# Patient Record
Sex: Female | Born: 1970 | Race: White | Hispanic: No | Marital: Married | Smoking: Former smoker
Health system: Southern US, Community
[De-identification: ages and names within clinical notes are randomized; demographics above are authoritative.]

## PROBLEM LIST (undated history)

## (undated) DIAGNOSIS — I10 Essential (primary) hypertension: Secondary | ICD-10-CM

## (undated) DIAGNOSIS — R5383 Other fatigue: Secondary | ICD-10-CM

## (undated) DIAGNOSIS — R634 Abnormal weight loss: Secondary | ICD-10-CM

## (undated) DIAGNOSIS — J329 Chronic sinusitis, unspecified: Secondary | ICD-10-CM

## (undated) DIAGNOSIS — N189 Chronic kidney disease, unspecified: Secondary | ICD-10-CM

## (undated) DIAGNOSIS — K219 Gastro-esophageal reflux disease without esophagitis: Secondary | ICD-10-CM

## (undated) DIAGNOSIS — D649 Anemia, unspecified: Secondary | ICD-10-CM

## (undated) DIAGNOSIS — E8809 Other disorders of plasma-protein metabolism, not elsewhere classified: Secondary | ICD-10-CM

## (undated) DIAGNOSIS — F419 Anxiety disorder, unspecified: Secondary | ICD-10-CM

## (undated) DIAGNOSIS — C8330 Diffuse large B-cell lymphoma, unspecified site: Secondary | ICD-10-CM

## (undated) DIAGNOSIS — Z9221 Personal history of antineoplastic chemotherapy: Secondary | ICD-10-CM

## (undated) HISTORY — DX: Chronic kidney disease, unspecified: N18.9

## (undated) HISTORY — DX: Abnormal weight loss: R63.4

## (undated) HISTORY — PX: ABDOMINAL HYSTERECTOMY: SHX81

## (undated) HISTORY — PX: OTHER SURGICAL HISTORY: SHX169

## (undated) HISTORY — DX: Chronic sinusitis, unspecified: J32.9

## (undated) HISTORY — DX: Other disorders of plasma-protein metabolism, not elsewhere classified: E88.09

## (undated) HISTORY — PX: TUBAL LIGATION: SHX77

## (undated) HISTORY — DX: Other fatigue: R53.83

## (undated) HISTORY — PX: RECTAL PROLAPSE REPAIR: SHX759

## (undated) HISTORY — DX: Anemia, unspecified: D64.9

---

## 2005-05-11 ENCOUNTER — Ambulatory Visit: Payer: Self-pay | Admitting: Obstetrics and Gynecology

## 2005-05-26 ENCOUNTER — Ambulatory Visit: Payer: Self-pay | Admitting: Obstetrics and Gynecology

## 2006-05-29 ENCOUNTER — Ambulatory Visit: Payer: Self-pay | Admitting: Obstetrics and Gynecology

## 2007-09-25 ENCOUNTER — Ambulatory Visit: Payer: Self-pay | Admitting: Obstetrics and Gynecology

## 2011-07-25 DIAGNOSIS — C8330 Diffuse large B-cell lymphoma, unspecified site: Secondary | ICD-10-CM

## 2011-07-25 HISTORY — PX: PORTACATH PLACEMENT: SHX2246

## 2011-07-25 HISTORY — DX: Diffuse large B-cell lymphoma, unspecified site: C83.30

## 2011-11-29 ENCOUNTER — Ambulatory Visit: Payer: Self-pay | Admitting: Obstetrics and Gynecology

## 2011-11-29 LAB — URINALYSIS, COMPLETE
Bacteria: NONE SEEN
Bilirubin,UR: NEGATIVE
Blood: NEGATIVE
Glucose,UR: NEGATIVE mg/dL (ref 0–75)
Ketone: NEGATIVE
Leukocyte Esterase: NEGATIVE
Nitrite: NEGATIVE
Ph: 6 (ref 4.5–8.0)
Protein: NEGATIVE
RBC,UR: <1 /HPF (ref 0–5)
Specific Gravity: 1.017 (ref 1.003–1.030)
Squamous Epithelial: <1
WBC UR: <1 /HPF (ref 0–5)

## 2011-11-29 LAB — HEMOGLOBIN: HGB: 9.3 g/dL — ABNORMAL LOW (ref 12.0–16.0)

## 2011-12-04 ENCOUNTER — Ambulatory Visit: Payer: Self-pay | Admitting: Obstetrics and Gynecology

## 2011-12-04 LAB — PREGNANCY, URINE: Pregnancy Test, Urine: NEGATIVE m[IU]/mL

## 2011-12-05 LAB — HEMATOCRIT: HCT: 26.4 % — ABNORMAL LOW (ref 35.0–47.0)

## 2011-12-13 LAB — PATHOLOGY REPORT

## 2011-12-28 ENCOUNTER — Other Ambulatory Visit: Payer: Self-pay | Admitting: Family Medicine

## 2012-01-02 ENCOUNTER — Ambulatory Visit: Payer: Self-pay | Admitting: Internal Medicine

## 2012-01-03 ENCOUNTER — Inpatient Hospital Stay: Payer: Self-pay | Admitting: Internal Medicine

## 2012-01-03 LAB — IRON AND TIBC
Iron Bind.Cap.(Total): 229 ug/dL — ABNORMAL LOW (ref 250–450)
Iron Saturation: 37 %
Iron: 85 ug/dL (ref 50–170)
Unbound Iron-Bind.Cap.: 144 ug/dL

## 2012-01-03 LAB — URINALYSIS, COMPLETE
Bilirubin,UR: NEGATIVE
Blood: NEGATIVE
Glucose,UR: NEGATIVE mg/dL (ref 0–75)
Ketone: NEGATIVE
Nitrite: NEGATIVE
Ph: 6 (ref 4.5–8.0)
Protein: NEGATIVE
RBC,UR: 3 /HPF (ref 0–5)
Specific Gravity: 1.012 (ref 1.003–1.030)
Squamous Epithelial: 2
WBC UR: 53 /HPF (ref 0–5)

## 2012-01-03 LAB — COMPREHENSIVE METABOLIC PANEL
Albumin: 2.7 g/dL — ABNORMAL LOW (ref 3.4–5.0)
Alkaline Phosphatase: 140 U/L — ABNORMAL HIGH (ref 50–136)
Anion Gap: 11 (ref 7–16)
BUN: 28 mg/dL — ABNORMAL HIGH (ref 7–18)
Bilirubin,Total: 0.7 mg/dL (ref 0.2–1.0)
Calcium, Total: 13.6 mg/dL (ref 8.5–10.1)
Chloride: 97 mmol/L — ABNORMAL LOW (ref 98–107)
Co2: 26 mmol/L (ref 21–32)
Creatinine: 1.42 mg/dL — ABNORMAL HIGH (ref 0.60–1.30)
EGFR (African American): 52 — ABNORMAL LOW
EGFR (Non-African Amer.): 45 — ABNORMAL LOW
Glucose: 86 mg/dL (ref 65–99)
Osmolality: 273 (ref 275–301)
Potassium: 4.2 mmol/L (ref 3.5–5.1)
SGOT(AST): 54 U/L — ABNORMAL HIGH (ref 15–37)
SGPT (ALT): 27 U/L
Sodium: 134 mmol/L — ABNORMAL LOW (ref 136–145)
Total Protein: 5.2 g/dL — ABNORMAL LOW (ref 6.4–8.2)

## 2012-01-03 LAB — CBC
HCT: 29.3 % — ABNORMAL LOW (ref 35.0–47.0)
HGB: 9.5 g/dL — ABNORMAL LOW (ref 12.0–16.0)
MCH: 24.8 pg — ABNORMAL LOW (ref 26.0–34.0)
MCHC: 32.3 g/dL (ref 32.0–36.0)
MCV: 77 fL — ABNORMAL LOW (ref 80–100)
Platelet: 147 10*3/uL — ABNORMAL LOW (ref 150–440)
RBC: 3.81 10*6/uL (ref 3.80–5.20)
RDW: 22.3 % — ABNORMAL HIGH (ref 11.5–14.5)
WBC: 3.1 10*3/uL — ABNORMAL LOW (ref 3.6–11.0)

## 2012-01-03 LAB — CULTURE, BLOOD (SINGLE)

## 2012-01-03 LAB — PROTIME-INR
INR: 1.1
Prothrombin Time: 14.6 secs (ref 11.5–14.7)

## 2012-01-03 LAB — RETICULOCYTES
Absolute Retic Count: 0.0668 10*6/uL (ref 0.024–0.084)
Reticulocyte: 1.99 % — ABNORMAL HIGH (ref 0.5–1.5)

## 2012-01-03 LAB — FOLATE: Folic Acid: 11.5 ng/mL (ref 3.1–100.0)

## 2012-01-03 LAB — TSH: Thyroid Stimulating Horm: 2.77 u[IU]/mL

## 2012-01-03 LAB — LIPASE, BLOOD: Lipase: 143 U/L (ref 73–393)

## 2012-01-03 LAB — FERRITIN: Ferritin (ARMC): 1016 ng/mL — ABNORMAL HIGH (ref 8–388)

## 2012-01-04 ENCOUNTER — Ambulatory Visit: Payer: Self-pay | Admitting: Internal Medicine

## 2012-01-04 LAB — BASIC METABOLIC PANEL
Anion Gap: 9 (ref 7–16)
BUN: 24 mg/dL — ABNORMAL HIGH (ref 7–18)
Calcium, Total: 11.4 mg/dL — ABNORMAL HIGH (ref 8.5–10.1)
Chloride: 103 mmol/L (ref 98–107)
Co2: 25 mmol/L (ref 21–32)
Creatinine: 1.33 mg/dL — ABNORMAL HIGH (ref 0.60–1.30)
EGFR (African American): 57 — ABNORMAL LOW
EGFR (Non-African Amer.): 49 — ABNORMAL LOW
Glucose: 86 mg/dL (ref 65–99)
Osmolality: 277 (ref 275–301)
Potassium: 4.2 mmol/L (ref 3.5–5.1)
Sodium: 137 mmol/L (ref 136–145)

## 2012-01-04 LAB — CBC WITH DIFFERENTIAL/PLATELET
Bands: 9 %
Basophil #: 0 10*3/uL (ref 0.0–0.1)
Basophil %: 0.2 %
Comment - H1-Com3: NORMAL
Eosinophil #: 0 10*3/uL (ref 0.0–0.7)
Eosinophil %: 0.1 %
HCT: 22.8 % — ABNORMAL LOW (ref 35.0–47.0)
HGB: 7.5 g/dL — ABNORMAL LOW (ref 12.0–16.0)
Lymphocyte #: 0.1 10*3/uL — ABNORMAL LOW (ref 1.0–3.6)
Lymphocyte %: 6.7 %
Lymphocytes: 7 %
MCH: 25 pg — ABNORMAL LOW (ref 26.0–34.0)
MCHC: 32.8 g/dL (ref 32.0–36.0)
MCV: 76 fL — ABNORMAL LOW (ref 80–100)
Metamyelocyte: 1 %
Monocyte #: 0.2 x10 3/mm (ref 0.2–0.9)
Monocyte %: 9.8 %
Monocytes: 2 %
Myelocyte: 3 %
Neutrophil #: 1.7 10*3/uL (ref 1.4–6.5)
Neutrophil %: 83.2 %
Platelet: 136 10*3/uL — ABNORMAL LOW (ref 150–440)
RBC: 2.99 10*6/uL — ABNORMAL LOW (ref 3.80–5.20)
RDW: 22.4 % — ABNORMAL HIGH (ref 11.5–14.5)
Segmented Neutrophils: 79 %
WBC: 2 10*3/uL — CL (ref 3.6–11.0)

## 2012-01-04 LAB — OCCULT BLOOD X 1 CARD TO LAB, STOOL: Occult Blood, Feces: NEGATIVE

## 2012-01-05 LAB — CBC WITH DIFFERENTIAL/PLATELET
Bands: 4 %
Basophil #: 0 10*3/uL (ref 0.0–0.1)
Basophil %: 0.3 %
Eosinophil #: 0 10*3/uL (ref 0.0–0.7)
Eosinophil %: 0.3 %
HCT: 31.1 % — ABNORMAL LOW (ref 35.0–47.0)
HGB: 10.3 g/dL — ABNORMAL LOW (ref 12.0–16.0)
Lymphocyte #: 0.1 10*3/uL — ABNORMAL LOW (ref 1.0–3.6)
Lymphocyte %: 5.5 %
Lymphocytes: 8 %
MCH: 27 pg (ref 26.0–34.0)
MCHC: 33.2 g/dL (ref 32.0–36.0)
MCV: 81 fL (ref 80–100)
Monocyte #: 0.2 x10 3/mm (ref 0.2–0.9)
Monocyte %: 7.7 %
Monocytes: 6 %
Neutrophil #: 2 10*3/uL (ref 1.4–6.5)
Neutrophil %: 86.2 %
Platelet: 120 10*3/uL — ABNORMAL LOW (ref 150–440)
RBC: 3.83 10*6/uL (ref 3.80–5.20)
RDW: 22.4 % — ABNORMAL HIGH (ref 11.5–14.5)
Segmented Neutrophils: 78 %
Variant Lymphocyte - H1-Rlymph: 4 %
WBC: 2.3 10*3/uL — ABNORMAL LOW (ref 3.6–11.0)

## 2012-01-05 LAB — BASIC METABOLIC PANEL
Anion Gap: 9 (ref 7–16)
BUN: 22 mg/dL — ABNORMAL HIGH (ref 7–18)
Calcium, Total: 10.3 mg/dL — ABNORMAL HIGH (ref 8.5–10.1)
Chloride: 106 mmol/L (ref 98–107)
Co2: 24 mmol/L (ref 21–32)
Creatinine: 1.1 mg/dL (ref 0.60–1.30)
EGFR (African American): >60
EGFR (Non-African Amer.): >60
Glucose: 84 mg/dL (ref 65–99)
Osmolality: 280 (ref 275–301)
Potassium: 3.6 mmol/L (ref 3.5–5.1)
Sodium: 139 mmol/L (ref 136–145)

## 2012-01-05 LAB — CA 125: CA 125: 409.3 U/mL — ABNORMAL HIGH (ref 0.0–34.0)

## 2012-01-05 LAB — CANCER ANTIGEN 27.29: CA 27.29: 114 U/mL — ABNORMAL HIGH (ref 0.0–38.6)

## 2012-01-05 LAB — URINE CULTURE

## 2012-01-05 LAB — CANCER ANTIGEN 19-9: CA 19-9: 14 U/mL (ref 0–35)

## 2012-01-05 LAB — AFP TUMOR MARKER: AFP-Tumor Marker: 1.1 ng/mL (ref 0.0–8.3)

## 2012-01-05 LAB — CEA: CEA: 1.1 ng/mL (ref 0.0–4.7)

## 2012-01-06 LAB — CBC WITH DIFFERENTIAL/PLATELET
Basophil #: 0 10*3/uL (ref 0.0–0.1)
Basophil %: 0.2 %
Eosinophil #: 0 10*3/uL (ref 0.0–0.7)
Eosinophil %: 0.1 %
HCT: 27.1 % — ABNORMAL LOW (ref 35.0–47.0)
HGB: 9.1 g/dL — ABNORMAL LOW (ref 12.0–16.0)
Lymphocyte #: 0.1 10*3/uL — ABNORMAL LOW (ref 1.0–3.6)
Lymphocyte %: 4.6 %
MCH: 27.1 pg (ref 26.0–34.0)
MCHC: 33.7 g/dL (ref 32.0–36.0)
MCV: 81 fL (ref 80–100)
Monocyte #: 0.2 x10 3/mm (ref 0.2–0.9)
Monocyte %: 8.4 %
Neutrophil #: 1.9 10*3/uL (ref 1.4–6.5)
Neutrophil %: 86.7 %
Platelet: 112 10*3/uL — ABNORMAL LOW (ref 150–440)
RBC: 3.37 10*6/uL — ABNORMAL LOW (ref 3.80–5.20)
RDW: 22.8 % — ABNORMAL HIGH (ref 11.5–14.5)
WBC: 2.1 10*3/uL — ABNORMAL LOW (ref 3.6–11.0)

## 2012-01-08 LAB — CBC CANCER CENTER
Basophil #: 0 x10 3/mm (ref 0.0–0.1)
Basophil %: 0.1 %
Eosinophil #: 0 x10 3/mm (ref 0.0–0.7)
Eosinophil %: 0.2 %
HCT: 30 % — ABNORMAL LOW (ref 35.0–47.0)
HGB: 10 g/dL — ABNORMAL LOW (ref 12.0–16.0)
Lymphocyte #: 0.1 x10 3/mm — ABNORMAL LOW (ref 1.0–3.6)
Lymphocyte %: 4 %
MCH: 26.8 pg (ref 26.0–34.0)
MCHC: 33.3 g/dL (ref 32.0–36.0)
MCV: 81 fL (ref 80–100)
Monocyte #: 0.2 x10 3/mm (ref 0.2–0.9)
Monocyte %: 8.1 %
Neutrophil #: 2.2 x10 3/mm (ref 1.4–6.5)
Neutrophil %: 87.6 %
Platelet: 161 x10 3/mm (ref 150–440)
RBC: 3.72 10*6/uL — ABNORMAL LOW (ref 3.80–5.20)
RDW: 22.9 % — ABNORMAL HIGH (ref 11.5–14.5)
WBC: 2.5 x10 3/mm — ABNORMAL LOW (ref 3.6–11.0)

## 2012-01-09 LAB — BASIC METABOLIC PANEL
Anion Gap: 9 (ref 7–16)
BUN: 16 mg/dL (ref 7–18)
Calcium, Total: 8.4 mg/dL — ABNORMAL LOW (ref 8.5–10.1)
Chloride: 107 mmol/L (ref 98–107)
Co2: 27 mmol/L (ref 21–32)
Creatinine: 0.92 mg/dL (ref 0.60–1.30)
EGFR (African American): >60
EGFR (Non-African Amer.): >60
Glucose: 140 mg/dL — ABNORMAL HIGH (ref 65–99)
Osmolality: 288 (ref 275–301)
Potassium: 3.6 mmol/L (ref 3.5–5.1)
Sodium: 143 mmol/L (ref 136–145)

## 2012-01-11 ENCOUNTER — Ambulatory Visit: Payer: Self-pay | Admitting: Vascular Surgery

## 2012-01-12 LAB — CBC CANCER CENTER
Basophil #: 0 x10 3/mm (ref 0.0–0.1)
Basophil %: 0.3 %
Eosinophil #: 0 x10 3/mm (ref 0.0–0.7)
Eosinophil %: 0 %
HCT: 32.6 % — ABNORMAL LOW (ref 35.0–47.0)
HGB: 10.8 g/dL — ABNORMAL LOW (ref 12.0–16.0)
Lymphocyte #: 0.1 x10 3/mm — ABNORMAL LOW (ref 1.0–3.6)
Lymphocyte %: 3.8 %
MCH: 27.6 pg (ref 26.0–34.0)
MCHC: 33.2 g/dL (ref 32.0–36.0)
MCV: 83 fL (ref 80–100)
Monocyte #: 0.3 x10 3/mm (ref 0.2–0.9)
Monocyte %: 11.7 %
Neutrophil #: 1.8 x10 3/mm (ref 1.4–6.5)
Neutrophil %: 84.2 %
Platelet: 184 x10 3/mm (ref 150–440)
RBC: 3.93 10*6/uL (ref 3.80–5.20)
RDW: 23.8 % — ABNORMAL HIGH (ref 11.5–14.5)
WBC: 2.2 x10 3/mm — ABNORMAL LOW (ref 3.6–11.0)

## 2012-01-12 LAB — COMPREHENSIVE METABOLIC PANEL
Albumin: 2.3 g/dL — ABNORMAL LOW (ref 3.4–5.0)
Alkaline Phosphatase: 136 U/L (ref 50–136)
Anion Gap: 8 (ref 7–16)
BUN: 16 mg/dL (ref 7–18)
Bilirubin,Total: 0.7 mg/dL (ref 0.2–1.0)
Calcium, Total: 8.2 mg/dL — ABNORMAL LOW (ref 8.5–10.1)
Chloride: 104 mmol/L (ref 98–107)
Co2: 26 mmol/L (ref 21–32)
Creatinine: 1.01 mg/dL (ref 0.60–1.30)
EGFR (African American): >60
EGFR (Non-African Amer.): >60
Glucose: 135 mg/dL — ABNORMAL HIGH (ref 65–99)
Osmolality: 279 (ref 275–301)
Potassium: 4 mmol/L (ref 3.5–5.1)
SGOT(AST): 53 U/L — ABNORMAL HIGH (ref 15–37)
SGPT (ALT): 29 U/L
Sodium: 138 mmol/L (ref 136–145)
Total Protein: 4.6 g/dL — ABNORMAL LOW (ref 6.4–8.2)

## 2012-01-19 LAB — CBC CANCER CENTER
Basophil: 1 %
Eosinophil: 6 %
HCT: 26.3 % — ABNORMAL LOW (ref 35.0–47.0)
HGB: 8.5 g/dL — ABNORMAL LOW (ref 12.0–16.0)
Lymphocytes: 24 %
MCH: 26.8 pg (ref 26.0–34.0)
MCHC: 32.2 g/dL (ref 32.0–36.0)
MCV: 83 fL (ref 80–100)
Monocytes: 11 %
Other Cells Blood: 2 %
Platelet: 84 x10 3/mm — ABNORMAL LOW (ref 150–440)
RBC: 3.17 10*6/uL — ABNORMAL LOW (ref 3.80–5.20)
RDW: 22.5 % — ABNORMAL HIGH (ref 11.5–14.5)
Segmented Neutrophils: 4 %
Variant Lymphocyte: 2 %
WBC: 0.1 x10 3/mm — CL (ref 3.6–11.0)

## 2012-01-22 ENCOUNTER — Ambulatory Visit: Payer: Self-pay | Admitting: Internal Medicine

## 2012-01-24 LAB — CBC CANCER CENTER
Bands: 29 %
Basophil #: 0 x10 3/mm (ref 0.0–0.1)
Basophil %: 0.2 %
Eosinophil #: 0 x10 3/mm (ref 0.0–0.7)
Eosinophil %: 0.2 %
HCT: 23.8 % — ABNORMAL LOW (ref 35.0–47.0)
HGB: 7.7 g/dL — ABNORMAL LOW (ref 12.0–16.0)
Lymphocyte #: 0.3 x10 3/mm — ABNORMAL LOW (ref 1.0–3.6)
Lymphocyte %: 4.6 %
Lymphocytes: 3 %
MCH: 28 pg (ref 26.0–34.0)
MCHC: 32.4 g/dL (ref 32.0–36.0)
MCV: 86 fL (ref 80–100)
Metamyelocyte: 5 %
Monocyte #: 0.2 x10 3/mm (ref 0.2–0.9)
Monocyte %: 3.6 %
Monocytes: 5 %
Myelocyte: 7 %
NRBC/100 WBC: 2 /100
Neutrophil #: 5.6 x10 3/mm (ref 1.4–6.5)
Neutrophil %: 91.4 %
Other Cells Blood: 4 %
Platelet: 130 x10 3/mm — ABNORMAL LOW (ref 150–440)
Promyelocyte: 2 %
RBC: 2.76 10*6/uL — ABNORMAL LOW (ref 3.80–5.20)
RDW: 20.3 % — ABNORMAL HIGH (ref 11.5–14.5)
Segmented Neutrophils: 45 %
WBC: 6.2 x10 3/mm (ref 3.6–11.0)

## 2012-02-05 LAB — CBC CANCER CENTER
Basophil #: 0 x10 3/mm (ref 0.0–0.1)
Basophil %: 1.1 %
Eosinophil #: 0.1 x10 3/mm (ref 0.0–0.7)
Eosinophil %: 2.6 %
HCT: 25.3 % — ABNORMAL LOW (ref 35.0–47.0)
HGB: 8.1 g/dL — ABNORMAL LOW (ref 12.0–16.0)
Lymphocyte #: 0.2 x10 3/mm — ABNORMAL LOW (ref 1.0–3.6)
Lymphocyte %: 6.8 %
MCH: 29.4 pg (ref 26.0–34.0)
MCHC: 31.9 g/dL — ABNORMAL LOW (ref 32.0–36.0)
MCV: 92 fL (ref 80–100)
Monocyte #: 0.3 x10 3/mm (ref 0.2–0.9)
Monocyte %: 9.3 %
Neutrophil #: 2.3 x10 3/mm (ref 1.4–6.5)
Neutrophil %: 80.2 %
Platelet: 234 x10 3/mm (ref 150–440)
RBC: 2.75 10*6/uL — ABNORMAL LOW (ref 3.80–5.20)
RDW: 26 % — ABNORMAL HIGH (ref 11.5–14.5)
WBC: 2.9 x10 3/mm — ABNORMAL LOW (ref 3.6–11.0)

## 2012-02-05 LAB — COMPREHENSIVE METABOLIC PANEL
Albumin: 3.2 g/dL — ABNORMAL LOW (ref 3.4–5.0)
Alkaline Phosphatase: 183 U/L — ABNORMAL HIGH (ref 50–136)
Anion Gap: 10 (ref 7–16)
BUN: 11 mg/dL (ref 7–18)
Bilirubin,Total: 0.6 mg/dL (ref 0.2–1.0)
Calcium, Total: 8.6 mg/dL (ref 8.5–10.1)
Chloride: 106 mmol/L (ref 98–107)
Co2: 27 mmol/L (ref 21–32)
Creatinine: 0.83 mg/dL (ref 0.60–1.30)
EGFR (African American): >60
EGFR (Non-African Amer.): >60
Glucose: 157 mg/dL — ABNORMAL HIGH (ref 65–99)
Osmolality: 288 (ref 275–301)
Potassium: 3.9 mmol/L (ref 3.5–5.1)
SGOT(AST): 38 U/L — ABNORMAL HIGH (ref 15–37)
SGPT (ALT): 56 U/L
Sodium: 143 mmol/L (ref 136–145)
Total Protein: 5.8 g/dL — ABNORMAL LOW (ref 6.4–8.2)

## 2012-02-12 LAB — CBC CANCER CENTER
Basophil #: 0 x10 3/mm (ref 0.0–0.1)
Basophil %: 0.7 %
Eosinophil #: 0 x10 3/mm (ref 0.0–0.7)
Eosinophil %: 0.2 %
HCT: 24.5 % — ABNORMAL LOW (ref 35.0–47.0)
HGB: 7.8 g/dL — ABNORMAL LOW (ref 12.0–16.0)
Lymphocyte #: 0.1 x10 3/mm — ABNORMAL LOW (ref 1.0–3.6)
Lymphocyte %: 5 %
MCH: 29.5 pg (ref 26.0–34.0)
MCHC: 32.1 g/dL (ref 32.0–36.0)
MCV: 92 fL (ref 80–100)
Monocyte #: 0.1 x10 3/mm — ABNORMAL LOW (ref 0.2–0.9)
Monocyte %: 4.8 %
Neutrophil #: 1.9 x10 3/mm (ref 1.4–6.5)
Neutrophil %: 89.3 %
Platelet: 155 x10 3/mm (ref 150–440)
RBC: 2.66 10*6/uL — ABNORMAL LOW (ref 3.80–5.20)
RDW: 22.1 % — ABNORMAL HIGH (ref 11.5–14.5)
WBC: 2.2 x10 3/mm — ABNORMAL LOW (ref 3.6–11.0)

## 2012-02-19 ENCOUNTER — Inpatient Hospital Stay: Payer: Self-pay | Admitting: Oncology

## 2012-02-19 LAB — COMPREHENSIVE METABOLIC PANEL
Albumin: 3.7 g/dL (ref 3.4–5.0)
Alkaline Phosphatase: 123 U/L (ref 50–136)
Anion Gap: 9 (ref 7–16)
BUN: 12 mg/dL (ref 7–18)
Bilirubin,Total: 0.4 mg/dL (ref 0.2–1.0)
Calcium, Total: 9 mg/dL (ref 8.5–10.1)
Chloride: 106 mmol/L (ref 98–107)
Co2: 28 mmol/L (ref 21–32)
Creatinine: 0.8 mg/dL (ref 0.60–1.30)
EGFR (African American): >60
EGFR (Non-African Amer.): >60
Glucose: 99 mg/dL (ref 65–99)
Osmolality: 285 (ref 275–301)
Potassium: 4 mmol/L (ref 3.5–5.1)
SGOT(AST): 12 U/L — ABNORMAL LOW (ref 15–37)
SGPT (ALT): 31 U/L
Sodium: 143 mmol/L (ref 136–145)
Total Protein: 6.3 g/dL — ABNORMAL LOW (ref 6.4–8.2)

## 2012-02-19 LAB — CBC CANCER CENTER
Basophil #: 0 x10 3/mm (ref 0.0–0.1)
Basophil %: 0.5 %
Eosinophil #: 0 x10 3/mm (ref 0.0–0.7)
Eosinophil %: 0.4 %
HCT: 28.1 % — ABNORMAL LOW (ref 35.0–47.0)
HGB: 9.3 g/dL — ABNORMAL LOW (ref 12.0–16.0)
Lymphocyte #: 0.2 x10 3/mm — ABNORMAL LOW (ref 1.0–3.6)
Lymphocyte %: 3.9 %
MCH: 31 pg (ref 26.0–34.0)
MCHC: 32.9 g/dL (ref 32.0–36.0)
MCV: 94 fL (ref 80–100)
Monocyte #: 0.5 x10 3/mm (ref 0.2–0.9)
Monocyte %: 9.7 %
Neutrophil #: 4.2 x10 3/mm (ref 1.4–6.5)
Neutrophil %: 85.5 %
Platelet: 203 x10 3/mm (ref 150–440)
RBC: 2.98 10*6/uL — ABNORMAL LOW (ref 3.80–5.20)
RDW: 22.7 % — ABNORMAL HIGH (ref 11.5–14.5)
WBC: 5 x10 3/mm (ref 3.6–11.0)

## 2012-02-19 LAB — URINE PH
Ph: 6 (ref 4.5–8.0)
Ph: 9 (ref 4.5–8.0)

## 2012-02-20 LAB — URINE PH
Ph: 8 (ref 4.5–8.0)
Ph: 8 (ref 4.5–8.0)
Ph: 9 (ref 4.5–8.0)
Ph: 8 (ref 4.5–8.0)

## 2012-02-20 LAB — BASIC METABOLIC PANEL
Anion Gap: 9 (ref 7–16)
BUN: 7 mg/dL (ref 7–18)
Calcium, Total: 8.3 mg/dL — ABNORMAL LOW (ref 8.5–10.1)
Chloride: 107 mmol/L (ref 98–107)
Co2: 29 mmol/L (ref 21–32)
Creatinine: 0.74 mg/dL (ref 0.60–1.30)
EGFR (African American): >60
EGFR (Non-African Amer.): >60
Glucose: 99 mg/dL (ref 65–99)
Osmolality: 287 (ref 275–301)
Potassium: 3.8 mmol/L (ref 3.5–5.1)
Sodium: 145 mmol/L (ref 136–145)

## 2012-02-21 LAB — URINE PH
Ph: 8 (ref 4.5–8.0)
Ph: 7 (ref 4.5–8.0)
Ph: 6 (ref 4.5–8.0)
Ph: 7 (ref 4.5–8.0)

## 2012-02-21 LAB — BASIC METABOLIC PANEL
Anion Gap: 6 — ABNORMAL LOW (ref 7–16)
BUN: 11 mg/dL (ref 7–18)
Calcium, Total: 8.6 mg/dL (ref 8.5–10.1)
Chloride: 107 mmol/L (ref 98–107)
Co2: 31 mmol/L (ref 21–32)
Creatinine: 0.6 mg/dL (ref 0.60–1.30)
EGFR (African American): >60
EGFR (Non-African Amer.): >60
Glucose: 114 mg/dL — ABNORMAL HIGH (ref 65–99)
Osmolality: 287 (ref 275–301)
Potassium: 3.6 mmol/L (ref 3.5–5.1)
Sodium: 144 mmol/L (ref 136–145)

## 2012-02-22 ENCOUNTER — Ambulatory Visit: Payer: Self-pay | Admitting: Internal Medicine

## 2012-02-22 LAB — URINE PH
Ph: 9 (ref 4.5–8.0)
Ph: 9 (ref 4.5–8.0)
Ph: 9 (ref 4.5–8.0)

## 2012-02-22 LAB — BASIC METABOLIC PANEL
Anion Gap: 6 — ABNORMAL LOW (ref 7–16)
BUN: 8 mg/dL (ref 7–18)
Calcium, Total: 7.8 mg/dL — ABNORMAL LOW (ref 8.5–10.1)
Chloride: 112 mmol/L — ABNORMAL HIGH (ref 98–107)
Co2: 31 mmol/L (ref 21–32)
Creatinine: 0.77 mg/dL (ref 0.60–1.30)
EGFR (African American): >60
EGFR (Non-African Amer.): >60
Glucose: 86 mg/dL (ref 65–99)
Osmolality: 294 (ref 275–301)
Potassium: 3.9 mmol/L (ref 3.5–5.1)
Sodium: 149 mmol/L — ABNORMAL HIGH (ref 136–145)

## 2012-02-23 LAB — URINE PH
Ph: 8 (ref 4.5–8.0)
Ph: 8 (ref 4.5–8.0)

## 2012-02-23 LAB — BASIC METABOLIC PANEL
Anion Gap: 7 (ref 7–16)
BUN: 9 mg/dL (ref 7–18)
Calcium, Total: 8.7 mg/dL (ref 8.5–10.1)
Chloride: 107 mmol/L (ref 98–107)
Co2: 30 mmol/L (ref 21–32)
Creatinine: 0.64 mg/dL (ref 0.60–1.30)
EGFR (African American): >60
EGFR (Non-African Amer.): >60
Glucose: 105 mg/dL — ABNORMAL HIGH (ref 65–99)
Osmolality: 286 (ref 275–301)
Potassium: 4.3 mmol/L (ref 3.5–5.1)
Sodium: 144 mmol/L (ref 136–145)

## 2012-02-26 LAB — COMPREHENSIVE METABOLIC PANEL
Albumin: 3.7 g/dL (ref 3.4–5.0)
Alkaline Phosphatase: 108 U/L (ref 50–136)
Anion Gap: 10 (ref 7–16)
BUN: 18 mg/dL (ref 7–18)
Bilirubin,Total: 0.4 mg/dL (ref 0.2–1.0)
Calcium, Total: 9.1 mg/dL (ref 8.5–10.1)
Chloride: 105 mmol/L (ref 98–107)
Co2: 27 mmol/L (ref 21–32)
Creatinine: 0.8 mg/dL (ref 0.60–1.30)
EGFR (African American): >60
EGFR (Non-African Amer.): >60
Glucose: 117 mg/dL — ABNORMAL HIGH (ref 65–99)
Osmolality: 286 (ref 275–301)
Potassium: 4 mmol/L (ref 3.5–5.1)
SGOT(AST): 32 U/L (ref 15–37)
SGPT (ALT): 65 U/L (ref 12–78)
Sodium: 142 mmol/L (ref 136–145)
Total Protein: 6.3 g/dL — ABNORMAL LOW (ref 6.4–8.2)

## 2012-02-26 LAB — CBC CANCER CENTER
Basophil #: 0 x10 3/mm (ref 0.0–0.1)
Basophil %: 0.7 %
Eosinophil #: 0 x10 3/mm (ref 0.0–0.7)
Eosinophil %: 0.2 %
HCT: 26.1 % — ABNORMAL LOW (ref 35.0–47.0)
HGB: 9 g/dL — ABNORMAL LOW (ref 12.0–16.0)
Lymphocyte #: 0.1 x10 3/mm — ABNORMAL LOW (ref 1.0–3.6)
Lymphocyte %: 4.7 %
MCH: 32.2 pg (ref 26.0–34.0)
MCHC: 34.4 g/dL (ref 32.0–36.0)
MCV: 94 fL (ref 80–100)
Monocyte #: 0.1 x10 3/mm — ABNORMAL LOW (ref 0.2–0.9)
Monocyte %: 4.8 %
Neutrophil #: 2.6 x10 3/mm (ref 1.4–6.5)
Neutrophil %: 89.6 %
Platelet: 262 x10 3/mm (ref 150–440)
RBC: 2.79 10*6/uL — ABNORMAL LOW (ref 3.80–5.20)
RDW: 20.6 % — ABNORMAL HIGH (ref 11.5–14.5)
WBC: 2.9 x10 3/mm — ABNORMAL LOW (ref 3.6–11.0)

## 2012-03-04 LAB — CBC CANCER CENTER
Basophil #: 0 x10 3/mm (ref 0.0–0.1)
Basophil %: 0.8 %
Eosinophil #: 0 x10 3/mm (ref 0.0–0.7)
Eosinophil %: 0.5 %
HCT: 18.7 % — ABNORMAL LOW (ref 35.0–47.0)
HGB: 6.2 g/dL — ABNORMAL LOW (ref 12.0–16.0)
Lymphocyte #: 0.1 x10 3/mm — ABNORMAL LOW (ref 1.0–3.6)
Lymphocyte %: 8.5 %
MCH: 31.8 pg (ref 26.0–34.0)
MCHC: 33.3 g/dL (ref 32.0–36.0)
MCV: 96 fL (ref 80–100)
Monocyte #: 0.1 x10 3/mm — ABNORMAL LOW (ref 0.2–0.9)
Monocyte %: 7.5 %
Neutrophil #: 0.8 x10 3/mm — ABNORMAL LOW (ref 1.4–6.5)
Neutrophil %: 82.7 %
Platelet: 65 x10 3/mm — ABNORMAL LOW (ref 150–440)
RBC: 1.96 10*6/uL — ABNORMAL LOW (ref 3.80–5.20)
RDW: 19.5 % — ABNORMAL HIGH (ref 11.5–14.5)
WBC: 1 x10 3/mm — CL (ref 3.6–11.0)

## 2012-03-06 LAB — CBC CANCER CENTER
Basophil #: 0 x10 3/mm (ref 0.0–0.1)
Basophil %: 0.9 %
Eosinophil #: 0 x10 3/mm (ref 0.0–0.7)
Eosinophil %: 0.6 %
HCT: 28 % — ABNORMAL LOW (ref 35.0–47.0)
HGB: 9.2 g/dL — ABNORMAL LOW (ref 12.0–16.0)
Lymphocyte #: 0.2 x10 3/mm — ABNORMAL LOW (ref 1.0–3.6)
Lymphocyte %: 7 %
MCH: 31.6 pg (ref 26.0–34.0)
MCHC: 32.8 g/dL (ref 32.0–36.0)
MCV: 96 fL (ref 80–100)
Monocyte #: 0.4 x10 3/mm (ref 0.2–0.9)
Monocyte %: 13 %
Neutrophil #: 2.2 x10 3/mm (ref 1.4–6.5)
Neutrophil %: 78.5 %
Platelet: 134 x10 3/mm — ABNORMAL LOW (ref 150–440)
RBC: 2.91 10*6/uL — ABNORMAL LOW (ref 3.80–5.20)
RDW: 19.6 % — ABNORMAL HIGH (ref 11.5–14.5)
WBC: 2.8 x10 3/mm — ABNORMAL LOW (ref 3.6–11.0)

## 2012-03-11 LAB — CBC CANCER CENTER
Basophil #: 0 x10 3/mm (ref 0.0–0.1)
Basophil %: 0.5 %
Eosinophil #: 0 x10 3/mm (ref 0.0–0.7)
Eosinophil %: 0.1 %
HCT: 30.5 % — ABNORMAL LOW (ref 35.0–47.0)
HGB: 9.9 g/dL — ABNORMAL LOW (ref 12.0–16.0)
Lymphocyte #: 0.2 x10 3/mm — ABNORMAL LOW (ref 1.0–3.6)
Lymphocyte %: 3.3 %
MCH: 31.3 pg (ref 26.0–34.0)
MCHC: 32.4 g/dL (ref 32.0–36.0)
MCV: 97 fL (ref 80–100)
Monocyte #: 0.4 x10 3/mm (ref 0.2–0.9)
Monocyte %: 6.9 %
Neutrophil #: 5.7 x10 3/mm (ref 1.4–6.5)
Neutrophil %: 89.2 %
Platelet: 272 x10 3/mm (ref 150–440)
RBC: 3.15 10*6/uL — ABNORMAL LOW (ref 3.80–5.20)
RDW: 19.4 % — ABNORMAL HIGH (ref 11.5–14.5)
WBC: 6.4 x10 3/mm (ref 3.6–11.0)

## 2012-03-18 ENCOUNTER — Ambulatory Visit: Payer: Self-pay | Admitting: Oncology

## 2012-03-18 LAB — CBC CANCER CENTER
Basophil #: 0 x10 3/mm (ref 0.0–0.1)
Basophil %: 0.6 %
Eosinophil #: 0 x10 3/mm (ref 0.0–0.7)
Eosinophil %: 0.2 %
HCT: 29.7 % — ABNORMAL LOW (ref 35.0–47.0)
HGB: 10 g/dL — ABNORMAL LOW (ref 12.0–16.0)
Lymphocyte #: 0.2 x10 3/mm — ABNORMAL LOW (ref 1.0–3.6)
Lymphocyte %: 4.7 %
MCH: 32 pg (ref 26.0–34.0)
MCHC: 33.6 g/dL (ref 32.0–36.0)
MCV: 95 fL (ref 80–100)
Monocyte #: 0.5 x10 3/mm (ref 0.2–0.9)
Monocyte %: 11.9 %
Neutrophil #: 3.5 x10 3/mm (ref 1.4–6.5)
Neutrophil %: 82.6 %
Platelet: 247 x10 3/mm (ref 150–440)
RBC: 3.12 10*6/uL — ABNORMAL LOW (ref 3.80–5.20)
RDW: 18.1 % — ABNORMAL HIGH (ref 11.5–14.5)
WBC: 4.2 x10 3/mm (ref 3.6–11.0)

## 2012-03-18 LAB — COMPREHENSIVE METABOLIC PANEL
Albumin: 3.7 g/dL (ref 3.4–5.0)
Alkaline Phosphatase: 81 U/L (ref 50–136)
Anion Gap: 7 (ref 7–16)
BUN: 19 mg/dL — ABNORMAL HIGH (ref 7–18)
Bilirubin,Total: 0.5 mg/dL (ref 0.2–1.0)
Calcium, Total: 8.6 mg/dL (ref 8.5–10.1)
Chloride: 105 mmol/L (ref 98–107)
Co2: 29 mmol/L (ref 21–32)
Creatinine: 0.72 mg/dL (ref 0.60–1.30)
EGFR (African American): >60
EGFR (Non-African Amer.): >60
Glucose: 87 mg/dL (ref 65–99)
Osmolality: 283 (ref 275–301)
Potassium: 3.9 mmol/L (ref 3.5–5.1)
SGOT(AST): 19 U/L (ref 15–37)
SGPT (ALT): 28 U/L (ref 12–78)
Sodium: 141 mmol/L (ref 136–145)
Total Protein: 6.2 g/dL — ABNORMAL LOW (ref 6.4–8.2)

## 2012-03-24 ENCOUNTER — Ambulatory Visit: Payer: Self-pay | Admitting: Internal Medicine

## 2012-03-26 LAB — CBC CANCER CENTER
Basophil #: 0 x10 3/mm (ref 0.0–0.1)
Basophil %: 0.9 %
Eosinophil #: 0 x10 3/mm (ref 0.0–0.7)
Eosinophil %: 0.4 %
HCT: 31.6 % — ABNORMAL LOW (ref 35.0–47.0)
HGB: 10.4 g/dL — ABNORMAL LOW (ref 12.0–16.0)
Lymphocyte #: 0.2 x10 3/mm — ABNORMAL LOW (ref 1.0–3.6)
Lymphocyte %: 4 %
MCH: 31.2 pg (ref 26.0–34.0)
MCHC: 32.8 g/dL (ref 32.0–36.0)
MCV: 95 fL (ref 80–100)
Monocyte #: 0.3 x10 3/mm (ref 0.2–0.9)
Monocyte %: 7.4 %
Neutrophil #: 3.5 x10 3/mm (ref 1.4–6.5)
Neutrophil %: 87.3 %
Platelet: 110 x10 3/mm — ABNORMAL LOW (ref 150–440)
RBC: 3.32 10*6/uL — ABNORMAL LOW (ref 3.80–5.20)
RDW: 16.8 % — ABNORMAL HIGH (ref 11.5–14.5)
WBC: 4 x10 3/mm (ref 3.6–11.0)

## 2012-04-01 ENCOUNTER — Inpatient Hospital Stay: Payer: Self-pay | Admitting: Oncology

## 2012-04-01 LAB — CBC CANCER CENTER
Basophil #: 0 x10 3/mm (ref 0.0–0.1)
Basophil %: 0.5 %
Eosinophil #: 0 x10 3/mm (ref 0.0–0.7)
Eosinophil %: 0.2 %
HCT: 34 % — ABNORMAL LOW (ref 35.0–47.0)
HGB: 11.1 g/dL — ABNORMAL LOW (ref 12.0–16.0)
Lymphocyte #: 0.2 x10 3/mm — ABNORMAL LOW (ref 1.0–3.6)
Lymphocyte %: 3 %
MCH: 31.1 pg (ref 26.0–34.0)
MCHC: 32.8 g/dL (ref 32.0–36.0)
MCV: 95 fL (ref 80–100)
Monocyte #: 0.7 x10 3/mm (ref 0.2–0.9)
Monocyte %: 9 %
Neutrophil #: 7 x10 3/mm — ABNORMAL HIGH (ref 1.4–6.5)
Neutrophil %: 87.3 %
Platelet: 192 x10 3/mm (ref 150–440)
RBC: 3.58 10*6/uL — ABNORMAL LOW (ref 3.80–5.20)
RDW: 16.5 % — ABNORMAL HIGH (ref 11.5–14.5)
WBC: 8 x10 3/mm (ref 3.6–11.0)

## 2012-04-01 LAB — COMPREHENSIVE METABOLIC PANEL
Albumin: 3.8 g/dL (ref 3.4–5.0)
Alkaline Phosphatase: 97 U/L (ref 50–136)
Anion Gap: 7 (ref 7–16)
BUN: 15 mg/dL (ref 7–18)
Bilirubin,Total: 0.3 mg/dL (ref 0.2–1.0)
Calcium, Total: 8.9 mg/dL (ref 8.5–10.1)
Chloride: 105 mmol/L (ref 98–107)
Co2: 29 mmol/L (ref 21–32)
Creatinine: 0.89 mg/dL (ref 0.60–1.30)
EGFR (African American): >60
EGFR (Non-African Amer.): >60
Glucose: 93 mg/dL (ref 65–99)
Osmolality: 282 (ref 275–301)
Potassium: 4.1 mmol/L (ref 3.5–5.1)
SGOT(AST): 16 U/L (ref 15–37)
SGPT (ALT): 25 U/L (ref 12–78)
Sodium: 141 mmol/L (ref 136–145)
Total Protein: 6.5 g/dL (ref 6.4–8.2)

## 2012-04-01 LAB — URINE PH
Ph: 5 (ref 4.5–8.0)
Ph: 9 (ref 4.5–8.0)
Ph: 9 (ref 4.5–8.0)

## 2012-04-02 LAB — URINE PH
Ph: 8 (ref 4.5–8.0)
Ph: 7 (ref 4.5–8.0)
Ph: 7 (ref 4.5–8.0)
Ph: 9 (ref 4.5–8.0)

## 2012-04-03 LAB — COMPREHENSIVE METABOLIC PANEL
Albumin: 3.1 g/dL — ABNORMAL LOW (ref 3.4–5.0)
Alkaline Phosphatase: 73 U/L (ref 50–136)
Anion Gap: 8 (ref 7–16)
BUN: 10 mg/dL (ref 7–18)
Bilirubin,Total: 0.3 mg/dL (ref 0.2–1.0)
Calcium, Total: 8.4 mg/dL — ABNORMAL LOW (ref 8.5–10.1)
Chloride: 111 mmol/L — ABNORMAL HIGH (ref 98–107)
Co2: 28 mmol/L (ref 21–32)
Creatinine: 0.66 mg/dL (ref 0.60–1.30)
EGFR (African American): >60
EGFR (Non-African Amer.): >60
Glucose: 97 mg/dL (ref 65–99)
Osmolality: 291 (ref 275–301)
Potassium: 3.5 mmol/L (ref 3.5–5.1)
SGOT(AST): 20 U/L (ref 15–37)
SGPT (ALT): 21 U/L (ref 12–78)
Sodium: 147 mmol/L — ABNORMAL HIGH (ref 136–145)
Total Protein: 5.5 g/dL — ABNORMAL LOW (ref 6.4–8.2)

## 2012-04-03 LAB — URINE PH
Ph: 7 (ref 4.5–8.0)
Ph: 9 (ref 4.5–8.0)
Ph: 9 (ref 4.5–8.0)
Ph: 9 (ref 4.5–8.0)
Ph: 9 (ref 4.5–8.0)

## 2012-04-04 LAB — BASIC METABOLIC PANEL
Anion Gap: 6 — ABNORMAL LOW (ref 7–16)
BUN: 9 mg/dL (ref 7–18)
Calcium, Total: 8.2 mg/dL — ABNORMAL LOW (ref 8.5–10.1)
Chloride: 112 mmol/L — ABNORMAL HIGH (ref 98–107)
Co2: 30 mmol/L (ref 21–32)
Creatinine: 0.62 mg/dL (ref 0.60–1.30)
EGFR (African American): >60
EGFR (Non-African Amer.): >60
Glucose: 84 mg/dL (ref 65–99)
Osmolality: 292 (ref 275–301)
Potassium: 3.8 mmol/L (ref 3.5–5.1)
Sodium: 148 mmol/L — ABNORMAL HIGH (ref 136–145)

## 2012-04-04 LAB — URINE PH
Ph: 7
Ph: 7
Ph: 9 (ref 4.5–8.0)
Ph: 9 (ref 4.5–8.0)
Ph: 8 (ref 4.5–8.0)

## 2012-04-05 LAB — BASIC METABOLIC PANEL
Anion Gap: 6 — ABNORMAL LOW (ref 7–16)
BUN: 8 mg/dL (ref 7–18)
Calcium, Total: 8.7 mg/dL (ref 8.5–10.1)
Chloride: 107 mmol/L (ref 98–107)
Co2: 29 mmol/L (ref 21–32)
Creatinine: 0.67 mg/dL (ref 0.60–1.30)
EGFR (African American): >60
EGFR (Non-African Amer.): >60
Glucose: 91 mg/dL (ref 65–99)
Osmolality: 281 (ref 275–301)
Potassium: 4.1 mmol/L (ref 3.5–5.1)
Sodium: 142 mmol/L (ref 136–145)

## 2012-04-05 LAB — URINE PH: Ph: 9 (ref 4.5–8.0)

## 2012-04-08 LAB — CBC CANCER CENTER
Basophil #: 0 x10 3/mm (ref 0.0–0.1)
Basophil %: 0.5 %
Eosinophil #: 0 x10 3/mm (ref 0.0–0.7)
Eosinophil %: 0.1 %
HCT: 34.5 % — ABNORMAL LOW (ref 35.0–47.0)
HGB: 11.4 g/dL — ABNORMAL LOW (ref 12.0–16.0)
Lymphocyte #: 0.2 x10 3/mm — ABNORMAL LOW (ref 1.0–3.6)
Lymphocyte %: 5.2 %
MCH: 30.6 pg (ref 26.0–34.0)
MCHC: 32.9 g/dL (ref 32.0–36.0)
MCV: 93 fL (ref 80–100)
Monocyte #: 0.3 x10 3/mm (ref 0.2–0.9)
Monocyte %: 6.4 %
Neutrophil #: 3.7 x10 3/mm (ref 1.4–6.5)
Neutrophil %: 87.8 %
Platelet: 221 x10 3/mm (ref 150–440)
RBC: 3.71 10*6/uL — ABNORMAL LOW (ref 3.80–5.20)
RDW: 15.6 % — ABNORMAL HIGH (ref 11.5–14.5)
WBC: 4.2 x10 3/mm (ref 3.6–11.0)

## 2012-04-08 LAB — COMPREHENSIVE METABOLIC PANEL
Albumin: 3.8 g/dL (ref 3.4–5.0)
Alkaline Phosphatase: 85 U/L (ref 50–136)
Anion Gap: 8 (ref 7–16)
BUN: 17 mg/dL (ref 7–18)
Bilirubin,Total: 0.3 mg/dL (ref 0.2–1.0)
Calcium, Total: 9.1 mg/dL (ref 8.5–10.1)
Chloride: 105 mmol/L (ref 98–107)
Co2: 29 mmol/L (ref 21–32)
Creatinine: 0.78 mg/dL (ref 0.60–1.30)
EGFR (African American): >60
EGFR (Non-African Amer.): >60
Glucose: 102 mg/dL — ABNORMAL HIGH (ref 65–99)
Osmolality: 285 (ref 275–301)
Potassium: 3.9 mmol/L (ref 3.5–5.1)
SGOT(AST): 19 U/L (ref 15–37)
SGPT (ALT): 34 U/L (ref 12–78)
Sodium: 142 mmol/L (ref 136–145)
Total Protein: 6.6 g/dL (ref 6.4–8.2)

## 2012-04-15 LAB — CBC CANCER CENTER
Basophil #: 0 x10 3/mm (ref 0.0–0.1)
Basophil %: 0.8 %
Eosinophil #: 0 x10 3/mm (ref 0.0–0.7)
Eosinophil %: 0.5 %
HCT: 31.3 % — ABNORMAL LOW (ref 35.0–47.0)
HGB: 10.3 g/dL — ABNORMAL LOW (ref 12.0–16.0)
Lymphocyte #: 0.2 x10 3/mm — ABNORMAL LOW (ref 1.0–3.6)
Lymphocyte %: 5.7 %
MCH: 30.5 pg (ref 26.0–34.0)
MCHC: 32.9 g/dL (ref 32.0–36.0)
MCV: 93 fL (ref 80–100)
Monocyte #: 0.3 x10 3/mm (ref 0.2–0.9)
Monocyte %: 8.5 %
Neutrophil #: 2.8 x10 3/mm (ref 1.4–6.5)
Neutrophil %: 84.5 %
Platelet: 91 x10 3/mm — ABNORMAL LOW (ref 150–440)
RBC: 3.37 10*6/uL — ABNORMAL LOW (ref 3.80–5.20)
RDW: 15.5 % — ABNORMAL HIGH (ref 11.5–14.5)
WBC: 3.4 x10 3/mm — ABNORMAL LOW (ref 3.6–11.0)

## 2012-04-22 LAB — CBC CANCER CENTER
Basophil #: 0 x10 3/mm (ref 0.0–0.1)
Basophil %: 0.4 %
Eosinophil #: 0 x10 3/mm (ref 0.0–0.7)
Eosinophil %: 0.3 %
HCT: 34.2 % — ABNORMAL LOW (ref 35.0–47.0)
HGB: 11.3 g/dL — ABNORMAL LOW (ref 12.0–16.0)
Lymphocyte #: 0.2 x10 3/mm — ABNORMAL LOW (ref 1.0–3.6)
Lymphocyte %: 4.1 %
MCH: 30.5 pg (ref 26.0–34.0)
MCHC: 32.9 g/dL (ref 32.0–36.0)
MCV: 93 fL (ref 80–100)
Monocyte #: 0.6 x10 3/mm (ref 0.2–0.9)
Monocyte %: 14.4 %
Neutrophil #: 3.5 x10 3/mm (ref 1.4–6.5)
Neutrophil %: 80.8 %
Platelet: 242 x10 3/mm (ref 150–440)
RBC: 3.69 10*6/uL — ABNORMAL LOW (ref 3.80–5.20)
RDW: 16.1 % — ABNORMAL HIGH (ref 11.5–14.5)
WBC: 4.4 x10 3/mm (ref 3.6–11.0)

## 2012-04-23 ENCOUNTER — Ambulatory Visit: Payer: Self-pay | Admitting: Internal Medicine

## 2012-04-23 LAB — URINALYSIS, COMPLETE
Bilirubin,UR: NEGATIVE
Blood: NEGATIVE
Glucose,UR: NEGATIVE mg/dL (ref 0–75)
Ketone: NEGATIVE
Leukocyte Esterase: NEGATIVE
Nitrite: NEGATIVE
Ph: 5 (ref 4.5–8.0)
Protein: NEGATIVE
RBC,UR: NONE SEEN /HPF (ref 0–5)
Specific Gravity: 1.016 (ref 1.003–1.030)
Squamous Epithelial: 1
WBC UR: <1 /HPF (ref 0–5)

## 2012-04-23 LAB — BASIC METABOLIC PANEL
Anion Gap: 12 (ref 7–16)
BUN: 10 mg/dL (ref 7–18)
Calcium, Total: 8.9 mg/dL (ref 8.5–10.1)
Chloride: 103 mmol/L (ref 98–107)
Co2: 27 mmol/L (ref 21–32)
Creatinine: 0.81 mg/dL (ref 0.60–1.30)
EGFR (African American): >60
EGFR (Non-African Amer.): >60
Glucose: 132 mg/dL — ABNORMAL HIGH (ref 65–99)
Osmolality: 284 (ref 275–301)
Potassium: 3.5 mmol/L (ref 3.5–5.1)
Sodium: 142 mmol/L (ref 136–145)

## 2012-04-24 LAB — URINE CULTURE

## 2012-04-29 LAB — CBC CANCER CENTER
Basophil #: 0 x10 3/mm (ref 0.0–0.1)
Basophil %: 1.4 %
Eosinophil #: 0 x10 3/mm (ref 0.0–0.7)
Eosinophil %: 0.5 %
HCT: 33.8 % — ABNORMAL LOW (ref 35.0–47.0)
HGB: 11.2 g/dL — ABNORMAL LOW (ref 12.0–16.0)
Lymphocyte #: 0.3 x10 3/mm — ABNORMAL LOW (ref 1.0–3.6)
Lymphocyte %: 8.8 %
MCH: 30.3 pg (ref 26.0–34.0)
MCHC: 33.2 g/dL (ref 32.0–36.0)
MCV: 91 fL (ref 80–100)
Monocyte #: 0.6 x10 3/mm (ref 0.2–0.9)
Monocyte %: 18.5 %
Neutrophil #: 2.2 x10 3/mm (ref 1.4–6.5)
Neutrophil %: 70.8 %
Platelet: 225 x10 3/mm (ref 150–440)
RBC: 3.71 10*6/uL — ABNORMAL LOW (ref 3.80–5.20)
RDW: 15.6 % — ABNORMAL HIGH (ref 11.5–14.5)
WBC: 3.1 x10 3/mm — ABNORMAL LOW (ref 3.6–11.0)

## 2012-04-29 LAB — COMPREHENSIVE METABOLIC PANEL
Albumin: 3.5 g/dL (ref 3.4–5.0)
Alkaline Phosphatase: 67 U/L (ref 50–136)
Anion Gap: 9 (ref 7–16)
BUN: 8 mg/dL (ref 7–18)
Bilirubin,Total: 0.3 mg/dL (ref 0.2–1.0)
Calcium, Total: 8.9 mg/dL (ref 8.5–10.1)
Chloride: 105 mmol/L (ref 98–107)
Co2: 27 mmol/L (ref 21–32)
Creatinine: 0.78 mg/dL (ref 0.60–1.30)
EGFR (African American): >60
EGFR (Non-African Amer.): >60
Glucose: 109 mg/dL — ABNORMAL HIGH (ref 65–99)
Osmolality: 280 (ref 275–301)
Potassium: 4.2 mmol/L (ref 3.5–5.1)
SGOT(AST): 21 U/L (ref 15–37)
SGPT (ALT): 25 U/L (ref 12–78)
Sodium: 141 mmol/L (ref 136–145)
Total Protein: 6.1 g/dL — ABNORMAL LOW (ref 6.4–8.2)

## 2012-05-06 LAB — CBC CANCER CENTER
Basophil #: 0 x10 3/mm (ref 0.0–0.1)
Basophil %: 1 %
Eosinophil #: 0 x10 3/mm (ref 0.0–0.7)
Eosinophil %: 0.6 %
HCT: 32 % — ABNORMAL LOW (ref 35.0–47.0)
HGB: 10.6 g/dL — ABNORMAL LOW (ref 12.0–16.0)
Lymphocyte #: 0.1 x10 3/mm — ABNORMAL LOW (ref 1.0–3.6)
Lymphocyte %: 3.6 %
MCH: 30.5 pg (ref 26.0–34.0)
MCHC: 33.2 g/dL (ref 32.0–36.0)
MCV: 92 fL (ref 80–100)
Monocyte #: 0.2 x10 3/mm (ref 0.2–0.9)
Monocyte %: 5 %
Neutrophil #: 3.7 x10 3/mm (ref 1.4–6.5)
Neutrophil %: 89.8 %
Platelet: 120 x10 3/mm — ABNORMAL LOW (ref 150–440)
RBC: 3.49 10*6/uL — ABNORMAL LOW (ref 3.80–5.20)
RDW: 15.6 % — ABNORMAL HIGH (ref 11.5–14.5)
WBC: 4.1 x10 3/mm (ref 3.6–11.0)

## 2012-05-13 LAB — CBC CANCER CENTER
Basophil #: 0 x10 3/mm (ref 0.0–0.1)
Basophil %: 0.6 %
Eosinophil #: 0 x10 3/mm (ref 0.0–0.7)
Eosinophil %: 0.2 %
HCT: 33.2 % — ABNORMAL LOW (ref 35.0–47.0)
HGB: 11 g/dL — ABNORMAL LOW (ref 12.0–16.0)
Lymphocyte #: 0.7 x10 3/mm — ABNORMAL LOW (ref 1.0–3.6)
Lymphocyte %: 9.9 %
MCH: 29.8 pg (ref 26.0–34.0)
MCHC: 33.2 g/dL (ref 32.0–36.0)
MCV: 90 fL (ref 80–100)
Monocyte #: 0.8 x10 3/mm (ref 0.2–0.9)
Monocyte %: 11.1 %
Neutrophil #: 5.4 x10 3/mm (ref 1.4–6.5)
Neutrophil %: 78.2 %
Platelet: 169 x10 3/mm (ref 150–440)
RBC: 3.7 10*6/uL — ABNORMAL LOW (ref 3.80–5.20)
RDW: 15.7 % — ABNORMAL HIGH (ref 11.5–14.5)
WBC: 6.9 x10 3/mm (ref 3.6–11.0)

## 2012-05-13 LAB — COMPREHENSIVE METABOLIC PANEL
Albumin: 3.8 g/dL (ref 3.4–5.0)
Alkaline Phosphatase: 85 U/L (ref 50–136)
Anion Gap: 11 (ref 7–16)
BUN: 8 mg/dL (ref 7–18)
Bilirubin,Total: 0.4 mg/dL (ref 0.2–1.0)
Calcium, Total: 9.3 mg/dL (ref 8.5–10.1)
Chloride: 100 mmol/L (ref 98–107)
Co2: 27 mmol/L (ref 21–32)
Creatinine: 0.76 mg/dL (ref 0.60–1.30)
EGFR (African American): >60
EGFR (Non-African Amer.): >60
Glucose: 97 mg/dL (ref 65–99)
Osmolality: 274 (ref 275–301)
Potassium: 4 mmol/L (ref 3.5–5.1)
SGOT(AST): 16 U/L (ref 15–37)
SGPT (ALT): 17 U/L (ref 12–78)
Sodium: 138 mmol/L (ref 136–145)
Total Protein: 6.4 g/dL (ref 6.4–8.2)

## 2012-05-24 ENCOUNTER — Ambulatory Visit: Payer: Self-pay | Admitting: Internal Medicine

## 2012-05-28 ENCOUNTER — Inpatient Hospital Stay: Payer: Self-pay | Admitting: Oncology

## 2012-05-28 LAB — CBC CANCER CENTER
Basophil #: 0 x10 3/mm (ref 0.0–0.1)
Basophil %: 1 %
Eosinophil #: 0.1 x10 3/mm (ref 0.0–0.7)
Eosinophil %: 1.6 %
HCT: 34.6 % — ABNORMAL LOW (ref 35.0–47.0)
HGB: 11.5 g/dL — ABNORMAL LOW (ref 12.0–16.0)
Lymphocyte #: 0.7 x10 3/mm — ABNORMAL LOW (ref 1.0–3.6)
Lymphocyte %: 13.7 %
MCH: 30.5 pg (ref 26.0–34.0)
MCHC: 33.3 g/dL (ref 32.0–36.0)
MCV: 92 fL (ref 80–100)
Monocyte #: 0.4 x10 3/mm (ref 0.2–0.9)
Monocyte %: 8.8 %
Neutrophil #: 3.6 x10 3/mm (ref 1.4–6.5)
Neutrophil %: 74.9 %
Platelet: 225 x10 3/mm (ref 150–440)
RBC: 3.78 10*6/uL — ABNORMAL LOW (ref 3.80–5.20)
RDW: 16.9 % — ABNORMAL HIGH (ref 11.5–14.5)
WBC: 4.8 x10 3/mm (ref 3.6–11.0)

## 2012-05-28 LAB — COMPREHENSIVE METABOLIC PANEL
Albumin: 3.7 g/dL (ref 3.4–5.0)
Alkaline Phosphatase: 61 U/L (ref 50–136)
Anion Gap: 13 (ref 7–16)
BUN: 14 mg/dL (ref 7–18)
Bilirubin,Total: 0.3 mg/dL (ref 0.2–1.0)
Calcium, Total: 9 mg/dL (ref 8.5–10.1)
Chloride: 105 mmol/L (ref 98–107)
Co2: 25 mmol/L (ref 21–32)
Creatinine: 0.88 mg/dL (ref 0.60–1.30)
EGFR (African American): >60
EGFR (Non-African Amer.): >60
Glucose: 107 mg/dL — ABNORMAL HIGH (ref 65–99)
Osmolality: 286 (ref 275–301)
Potassium: 4.1 mmol/L (ref 3.5–5.1)
SGOT(AST): 13 U/L — ABNORMAL LOW (ref 15–37)
SGPT (ALT): 23 U/L (ref 12–78)
Sodium: 143 mmol/L (ref 136–145)
Total Protein: 6.3 g/dL — ABNORMAL LOW (ref 6.4–8.2)

## 2012-05-28 LAB — URINE PH
Ph: 7 (ref 4.5–8.0)
Ph: 8 (ref 4.5–8.0)

## 2012-05-29 LAB — URINE PH
Ph: 7 (ref 4.5–8.0)
Ph: 9 (ref 4.5–8.0)
Ph: 8 (ref 4.5–8.0)
Ph: 8 (ref 4.5–8.0)
Ph: 9 (ref 4.5–8.0)

## 2012-05-30 LAB — COMPREHENSIVE METABOLIC PANEL
Albumin: 3.6 g/dL (ref 3.4–5.0)
Alkaline Phosphatase: 50 U/L (ref 50–136)
Anion Gap: 7 (ref 7–16)
BUN: 9 mg/dL (ref 7–18)
Bilirubin,Total: 0.6 mg/dL (ref 0.2–1.0)
Calcium, Total: 8.7 mg/dL (ref 8.5–10.1)
Chloride: 112 mmol/L — ABNORMAL HIGH (ref 98–107)
Co2: 28 mmol/L (ref 21–32)
Creatinine: 0.75 mg/dL (ref 0.60–1.30)
EGFR (African American): >60
EGFR (Non-African Amer.): >60
Glucose: 100 mg/dL — ABNORMAL HIGH (ref 65–99)
Osmolality: 291 (ref 275–301)
Potassium: 4 mmol/L (ref 3.5–5.1)
SGOT(AST): 23 U/L (ref 15–37)
SGPT (ALT): 30 U/L (ref 12–78)
Sodium: 147 mmol/L — ABNORMAL HIGH (ref 136–145)
Total Protein: 6 g/dL — ABNORMAL LOW (ref 6.4–8.2)

## 2012-05-30 LAB — CBC WITH DIFFERENTIAL/PLATELET
Basophil #: 0 10*3/uL (ref 0.0–0.1)
Basophil %: 0.2 %
Eosinophil #: 0 10*3/uL (ref 0.0–0.7)
Eosinophil %: 0 %
HCT: 33.3 % — ABNORMAL LOW (ref 35.0–47.0)
HGB: 11 g/dL — ABNORMAL LOW (ref 12.0–16.0)
Lymphocyte #: 0.4 10*3/uL — ABNORMAL LOW (ref 1.0–3.6)
Lymphocyte %: 3.8 %
MCH: 29.7 pg (ref 26.0–34.0)
MCHC: 33.2 g/dL (ref 32.0–36.0)
MCV: 90 fL (ref 80–100)
Monocyte #: 0.5 x10 3/mm (ref 0.2–0.9)
Monocyte %: 4.3 %
Neutrophil #: 10.9 10*3/uL — ABNORMAL HIGH (ref 1.4–6.5)
Neutrophil %: 91.7 %
Platelet: 246 10*3/uL (ref 150–440)
RBC: 3.72 10*6/uL — ABNORMAL LOW (ref 3.80–5.20)
RDW: 16.8 % — ABNORMAL HIGH (ref 11.5–14.5)
WBC: 11.9 10*3/uL — ABNORMAL HIGH (ref 3.6–11.0)

## 2012-05-30 LAB — URINE PH
Ph: 9 (ref 4.5–8.0)
Ph: 8 (ref 4.5–8.0)
Ph: 9 (ref 4.5–8.0)
Ph: 8 (ref 4.5–8.0)
Ph: 7 (ref 4.5–8.0)
Ph: 7 (ref 4.5–8.0)

## 2012-05-31 LAB — CBC WITH DIFFERENTIAL/PLATELET
Basophil #: 0 10*3/uL (ref 0.0–0.1)
Basophil %: 0.6 %
Eosinophil #: 0 10*3/uL (ref 0.0–0.7)
Eosinophil %: 0.6 %
HCT: 31.9 % — ABNORMAL LOW (ref 35.0–47.0)
HGB: 10.8 g/dL — ABNORMAL LOW (ref 12.0–16.0)
Lymphocyte #: 0.5 10*3/uL — ABNORMAL LOW (ref 1.0–3.6)
Lymphocyte %: 9.6 %
MCH: 30.4 pg (ref 26.0–34.0)
MCHC: 33.9 g/dL (ref 32.0–36.0)
MCV: 90 fL (ref 80–100)
Monocyte #: 0.3 x10 3/mm (ref 0.2–0.9)
Monocyte %: 5.5 %
Neutrophil #: 4.4 10*3/uL (ref 1.4–6.5)
Neutrophil %: 83.7 %
Platelet: 228 10*3/uL (ref 150–440)
RBC: 3.55 10*6/uL — ABNORMAL LOW (ref 3.80–5.20)
RDW: 16.4 % — ABNORMAL HIGH (ref 11.5–14.5)
WBC: 5.3 10*3/uL (ref 3.6–11.0)

## 2012-05-31 LAB — COMPREHENSIVE METABOLIC PANEL
Albumin: 3.1 g/dL — ABNORMAL LOW (ref 3.4–5.0)
Alkaline Phosphatase: 46 U/L — ABNORMAL LOW (ref 50–136)
Anion Gap: 8 (ref 7–16)
BUN: 10 mg/dL (ref 7–18)
Bilirubin,Total: 0.4 mg/dL (ref 0.2–1.0)
Calcium, Total: 8 mg/dL — ABNORMAL LOW (ref 8.5–10.1)
Chloride: 110 mmol/L — ABNORMAL HIGH (ref 98–107)
Co2: 29 mmol/L (ref 21–32)
Creatinine: 0.67 mg/dL (ref 0.60–1.30)
EGFR (African American): >60
EGFR (Non-African Amer.): >60
Glucose: 85 mg/dL (ref 65–99)
Osmolality: 291 (ref 275–301)
Potassium: 3.8 mmol/L (ref 3.5–5.1)
SGOT(AST): 26 U/L (ref 15–37)
SGPT (ALT): 34 U/L (ref 12–78)
Sodium: 147 mmol/L — ABNORMAL HIGH (ref 136–145)
Total Protein: 5.3 g/dL — ABNORMAL LOW (ref 6.4–8.2)

## 2012-05-31 LAB — URINE PH
Ph: 7 (ref 4.5–8.0)
Ph: 7 (ref 4.5–8.0)
Ph: 8 (ref 4.5–8.0)
Ph: 7 (ref 4.5–8.0)
Ph: 9 (ref 4.5–8.0)

## 2012-06-01 LAB — COMPREHENSIVE METABOLIC PANEL
Albumin: 3.4 g/dL (ref 3.4–5.0)
Alkaline Phosphatase: 45 U/L — ABNORMAL LOW (ref 50–136)
Anion Gap: 9 (ref 7–16)
BUN: 9 mg/dL (ref 7–18)
Bilirubin,Total: 0.5 mg/dL (ref 0.2–1.0)
Calcium, Total: 8.7 mg/dL (ref 8.5–10.1)
Chloride: 109 mmol/L — ABNORMAL HIGH (ref 98–107)
Co2: 28 mmol/L (ref 21–32)
Creatinine: 0.69 mg/dL (ref 0.60–1.30)
EGFR (African American): >60
EGFR (Non-African Amer.): >60
Glucose: 92 mg/dL (ref 65–99)
Osmolality: 289 (ref 275–301)
Potassium: 4.1 mmol/L (ref 3.5–5.1)
SGOT(AST): 37 U/L (ref 15–37)
SGPT (ALT): 45 U/L (ref 12–78)
Sodium: 146 mmol/L — ABNORMAL HIGH (ref 136–145)
Total Protein: 5.8 g/dL — ABNORMAL LOW (ref 6.4–8.2)

## 2012-06-01 LAB — CBC WITH DIFFERENTIAL/PLATELET
Basophil #: 0 10*3/uL (ref 0.0–0.1)
Basophil %: 0.9 %
Eosinophil #: 0.1 10*3/uL (ref 0.0–0.7)
Eosinophil %: 1.6 %
HCT: 33.3 % — ABNORMAL LOW (ref 35.0–47.0)
HGB: 11.4 g/dL — ABNORMAL LOW (ref 12.0–16.0)
Lymphocyte #: 0.4 10*3/uL — ABNORMAL LOW (ref 1.0–3.6)
Lymphocyte %: 9.8 %
MCH: 30.6 pg (ref 26.0–34.0)
MCHC: 34.1 g/dL (ref 32.0–36.0)
MCV: 90 fL (ref 80–100)
Monocyte #: 0.1 x10 3/mm — ABNORMAL LOW (ref 0.2–0.9)
Monocyte %: 2 %
Neutrophil #: 3.6 10*3/uL (ref 1.4–6.5)
Neutrophil %: 85.7 %
Platelet: 236 10*3/uL (ref 150–440)
RBC: 3.72 10*6/uL — ABNORMAL LOW (ref 3.80–5.20)
RDW: 16.6 % — ABNORMAL HIGH (ref 11.5–14.5)
WBC: 4.2 10*3/uL (ref 3.6–11.0)

## 2012-06-10 ENCOUNTER — Ambulatory Visit: Payer: Self-pay | Admitting: Oncology

## 2012-06-12 LAB — CBC CANCER CENTER
Eosinophil: 3 %
HCT: 37.9 % (ref 35.0–47.0)
HGB: 12.4 g/dL (ref 12.0–16.0)
Lymphocytes: 5 %
MCH: 29.6 pg (ref 26.0–34.0)
MCHC: 32.6 g/dL (ref 32.0–36.0)
MCV: 91 fL (ref 80–100)
Monocytes: 6 %
Platelet: 175 x10 3/mm (ref 150–440)
RBC: 4.18 10*6/uL (ref 3.80–5.20)
RDW: 15.4 % — ABNORMAL HIGH (ref 11.5–14.5)
Segmented Neutrophils: 86 %
WBC: 8.8 x10 3/mm (ref 3.6–11.0)

## 2012-06-23 ENCOUNTER — Ambulatory Visit: Payer: Self-pay | Admitting: Internal Medicine

## 2012-06-23 ENCOUNTER — Ambulatory Visit: Payer: Self-pay | Admitting: Oncology

## 2012-08-01 ENCOUNTER — Ambulatory Visit: Payer: Self-pay | Admitting: Oncology

## 2012-08-02 DIAGNOSIS — Z01810 Encounter for preprocedural cardiovascular examination: Secondary | ICD-10-CM

## 2012-08-06 LAB — CBC CANCER CENTER
Bands: 2 %
Basophil: 2 %
Comment - H1-Com1: NORMAL
Comment - H1-Com2: NORMAL
Eosinophil: 1 %
HCT: 37.9 % (ref 35.0–47.0)
HGB: 13.2 g/dL (ref 12.0–16.0)
Lymphocytes: 8 %
MCH: 28.9 pg (ref 26.0–34.0)
MCHC: 34.7 g/dL (ref 32.0–36.0)
MCV: 83 fL (ref 80–100)
Monocytes: 6 %
Platelet: 200 x10 3/mm (ref 150–440)
RBC: 4.56 10*6/uL (ref 3.80–5.20)
RDW: 13.1 % (ref 11.5–14.5)
Segmented Neutrophils: 81 %
WBC: 4.3 x10 3/mm (ref 3.6–11.0)

## 2012-08-06 LAB — COMPREHENSIVE METABOLIC PANEL
Albumin: 3.7 g/dL (ref 3.4–5.0)
Alkaline Phosphatase: 71 U/L (ref 50–136)
Anion Gap: 10 (ref 7–16)
BUN: 15 mg/dL (ref 7–18)
Bilirubin,Total: 0.4 mg/dL (ref 0.2–1.0)
Calcium, Total: 8.9 mg/dL (ref 8.5–10.1)
Chloride: 104 mmol/L (ref 98–107)
Co2: 27 mmol/L (ref 21–32)
Creatinine: 0.77 mg/dL (ref 0.60–1.30)
EGFR (African American): >60
EGFR (Non-African Amer.): >60
Glucose: 117 mg/dL — ABNORMAL HIGH (ref 65–99)
Osmolality: 283 (ref 275–301)
Potassium: 4 mmol/L (ref 3.5–5.1)
SGOT(AST): 19 U/L (ref 15–37)
SGPT (ALT): 30 U/L (ref 12–78)
Sodium: 141 mmol/L (ref 136–145)
Total Protein: 6.6 g/dL (ref 6.4–8.2)

## 2012-08-12 ENCOUNTER — Ambulatory Visit: Payer: Self-pay | Admitting: Oncology

## 2012-08-24 ENCOUNTER — Ambulatory Visit: Payer: Self-pay | Admitting: Oncology

## 2012-09-03 ENCOUNTER — Ambulatory Visit: Payer: Self-pay | Admitting: Obstetrics and Gynecology

## 2012-10-02 ENCOUNTER — Ambulatory Visit: Payer: Self-pay | Admitting: Oncology

## 2012-10-09 DIAGNOSIS — Z9481 Bone marrow transplant status: Secondary | ICD-10-CM | POA: Insufficient documentation

## 2012-10-09 DIAGNOSIS — T8600 Unspecified complication of bone marrow transplant: Secondary | ICD-10-CM | POA: Insufficient documentation

## 2012-10-22 ENCOUNTER — Ambulatory Visit: Payer: Self-pay | Admitting: Oncology

## 2012-10-28 DIAGNOSIS — C8330 Diffuse large B-cell lymphoma, unspecified site: Secondary | ICD-10-CM | POA: Insufficient documentation

## 2012-10-28 DIAGNOSIS — I10 Essential (primary) hypertension: Secondary | ICD-10-CM | POA: Insufficient documentation

## 2012-10-29 DIAGNOSIS — K219 Gastro-esophageal reflux disease without esophagitis: Secondary | ICD-10-CM | POA: Insufficient documentation

## 2012-11-14 LAB — CBC CANCER CENTER
Basophil #: 0.1 x10 3/mm (ref 0.0–0.1)
Basophil %: 2.2 %
Eosinophil #: 0.3 x10 3/mm (ref 0.0–0.7)
Eosinophil %: 5.3 %
HCT: 39.5 % (ref 35.0–47.0)
HGB: 13.1 g/dL (ref 12.0–16.0)
Lymphocyte #: 1 x10 3/mm (ref 1.0–3.6)
Lymphocyte %: 20.2 %
MCH: 28.8 pg (ref 26.0–34.0)
MCHC: 33.3 g/dL (ref 32.0–36.0)
MCV: 86 fL (ref 80–100)
Monocyte #: 0.4 x10 3/mm (ref 0.2–0.9)
Monocyte %: 8.2 %
Neutrophil #: 3.1 x10 3/mm (ref 1.4–6.5)
Neutrophil %: 64.1 %
Platelet: 145 x10 3/mm — ABNORMAL LOW (ref 150–440)
RBC: 4.57 10*6/uL (ref 3.80–5.20)
RDW: 17.1 % — ABNORMAL HIGH (ref 11.5–14.5)
WBC: 4.8 x10 3/mm (ref 3.6–11.0)

## 2012-11-14 LAB — COMPREHENSIVE METABOLIC PANEL
Albumin: 3.7 g/dL (ref 3.4–5.0)
Alkaline Phosphatase: 110 U/L (ref 50–136)
Anion Gap: 8 (ref 7–16)
BUN: 10 mg/dL (ref 7–18)
Bilirubin,Total: 0.5 mg/dL (ref 0.2–1.0)
Calcium, Total: 9.4 mg/dL (ref 8.5–10.1)
Chloride: 106 mmol/L (ref 98–107)
Co2: 29 mmol/L (ref 21–32)
Creatinine: 1.14 mg/dL (ref 0.60–1.30)
EGFR (African American): >60
EGFR (Non-African Amer.): 58 — ABNORMAL LOW
Glucose: 141 mg/dL — ABNORMAL HIGH (ref 65–99)
Osmolality: 286 (ref 275–301)
Potassium: 4.1 mmol/L (ref 3.5–5.1)
SGOT(AST): 26 U/L (ref 15–37)
SGPT (ALT): 36 U/L (ref 12–78)
Sodium: 143 mmol/L (ref 136–145)
Total Protein: 6.4 g/dL (ref 6.4–8.2)

## 2012-11-14 LAB — MAGNESIUM: Magnesium: 1.5 mg/dL — ABNORMAL LOW

## 2012-11-21 ENCOUNTER — Ambulatory Visit: Payer: Self-pay | Admitting: Oncology

## 2012-11-25 LAB — CBC CANCER CENTER
Basophil #: 0.1 x10 3/mm (ref 0.0–0.1)
Basophil %: 0.9 %
Eosinophil #: 0.3 x10 3/mm (ref 0.0–0.7)
Eosinophil %: 5.5 %
HCT: 39.6 % (ref 35.0–47.0)
HGB: 13.2 g/dL (ref 12.0–16.0)
Lymphocyte #: 0.9 x10 3/mm — ABNORMAL LOW (ref 1.0–3.6)
Lymphocyte %: 15.2 %
MCH: 28.9 pg (ref 26.0–34.0)
MCHC: 33.4 g/dL (ref 32.0–36.0)
MCV: 87 fL (ref 80–100)
Monocyte #: 0.3 x10 3/mm (ref 0.2–0.9)
Monocyte %: 5.7 %
Neutrophil #: 4.3 x10 3/mm (ref 1.4–6.5)
Neutrophil %: 72.7 %
Platelet: 181 x10 3/mm (ref 150–440)
RBC: 4.57 10*6/uL (ref 3.80–5.20)
RDW: 18 % — ABNORMAL HIGH (ref 11.5–14.5)
WBC: 5.9 x10 3/mm (ref 3.6–11.0)

## 2012-11-25 LAB — COMPREHENSIVE METABOLIC PANEL
Albumin: 4 g/dL (ref 3.4–5.0)
Alkaline Phosphatase: 96 U/L (ref 50–136)
Anion Gap: 12 (ref 7–16)
BUN: 9 mg/dL (ref 7–18)
Bilirubin,Total: 0.5 mg/dL (ref 0.2–1.0)
Calcium, Total: 9.4 mg/dL (ref 8.5–10.1)
Chloride: 105 mmol/L (ref 98–107)
Co2: 27 mmol/L (ref 21–32)
Creatinine: 0.81 mg/dL (ref 0.60–1.30)
EGFR (African American): >60
EGFR (Non-African Amer.): >60
Glucose: 120 mg/dL — ABNORMAL HIGH (ref 65–99)
Osmolality: 287 (ref 275–301)
Potassium: 3.6 mmol/L (ref 3.5–5.1)
SGOT(AST): 28 U/L (ref 15–37)
SGPT (ALT): 48 U/L (ref 12–78)
Sodium: 144 mmol/L (ref 136–145)
Total Protein: 6.7 g/dL (ref 6.4–8.2)

## 2012-11-25 LAB — MAGNESIUM: Magnesium: 1.5 mg/dL — ABNORMAL LOW

## 2012-12-09 LAB — COMPREHENSIVE METABOLIC PANEL
Albumin: 3.5 g/dL (ref 3.4–5.0)
Alkaline Phosphatase: 78 U/L (ref 50–136)
Anion Gap: 9 (ref 7–16)
BUN: 13 mg/dL (ref 7–18)
Bilirubin,Total: 0.5 mg/dL (ref 0.2–1.0)
Calcium, Total: 8.6 mg/dL (ref 8.5–10.1)
Chloride: 105 mmol/L (ref 98–107)
Co2: 28 mmol/L (ref 21–32)
Creatinine: 0.9 mg/dL (ref 0.60–1.30)
EGFR (African American): >60
EGFR (Non-African Amer.): >60
Glucose: 108 mg/dL — ABNORMAL HIGH (ref 65–99)
Osmolality: 284 (ref 275–301)
Potassium: 3.8 mmol/L (ref 3.5–5.1)
SGOT(AST): 28 U/L (ref 15–37)
SGPT (ALT): 43 U/L (ref 12–78)
Sodium: 142 mmol/L (ref 136–145)
Total Protein: 6 g/dL — ABNORMAL LOW (ref 6.4–8.2)

## 2012-12-09 LAB — CBC CANCER CENTER
Basophil #: 0 x10 3/mm (ref 0.0–0.1)
Basophil %: 1 %
Eosinophil #: 0.2 x10 3/mm (ref 0.0–0.7)
Eosinophil %: 4.8 %
HCT: 33 % — ABNORMAL LOW (ref 35.0–47.0)
HGB: 11.5 g/dL — ABNORMAL LOW (ref 12.0–16.0)
Lymphocyte #: 0.8 x10 3/mm — ABNORMAL LOW (ref 1.0–3.6)
Lymphocyte %: 17.1 %
MCH: 30.2 pg (ref 26.0–34.0)
MCHC: 34.8 g/dL (ref 32.0–36.0)
MCV: 87 fL (ref 80–100)
Monocyte #: 0.4 x10 3/mm (ref 0.2–0.9)
Monocyte %: 9.3 %
Neutrophil #: 3.2 x10 3/mm (ref 1.4–6.5)
Neutrophil %: 67.8 %
Platelet: 179 x10 3/mm (ref 150–440)
RBC: 3.8 10*6/uL (ref 3.80–5.20)
RDW: 18 % — ABNORMAL HIGH (ref 11.5–14.5)
WBC: 4.7 x10 3/mm (ref 3.6–11.0)

## 2012-12-09 LAB — MAGNESIUM: Magnesium: 1.7 mg/dL — ABNORMAL LOW

## 2012-12-22 ENCOUNTER — Ambulatory Visit: Payer: Self-pay | Admitting: Oncology

## 2012-12-22 HISTORY — PX: LIMBAL STEM CELL TRANSPLANT: SHX1969

## 2013-01-20 LAB — CBC CANCER CENTER
Basophil #: 0 x10 3/mm (ref 0.0–0.1)
Basophil %: 0.8 %
Eosinophil #: 0.1 x10 3/mm (ref 0.0–0.7)
Eosinophil %: 2.6 %
HCT: 35.3 % (ref 35.0–47.0)
HGB: 12.5 g/dL (ref 12.0–16.0)
Lymphocyte #: 0.8 x10 3/mm — ABNORMAL LOW (ref 1.0–3.6)
Lymphocyte %: 17.3 %
MCH: 32.1 pg (ref 26.0–34.0)
MCHC: 35.5 g/dL (ref 32.0–36.0)
MCV: 91 fL (ref 80–100)
Monocyte #: 0.4 x10 3/mm (ref 0.2–0.9)
Monocyte %: 8.4 %
Neutrophil #: 3.3 x10 3/mm (ref 1.4–6.5)
Neutrophil %: 70.9 %
Platelet: 204 x10 3/mm (ref 150–440)
RBC: 3.9 10*6/uL (ref 3.80–5.20)
RDW: 13.4 % (ref 11.5–14.5)
WBC: 4.6 x10 3/mm (ref 3.6–11.0)

## 2013-01-20 LAB — COMPREHENSIVE METABOLIC PANEL
Albumin: 4 g/dL (ref 3.4–5.0)
Alkaline Phosphatase: 88 U/L (ref 50–136)
Anion Gap: 9 (ref 7–16)
BUN: 10 mg/dL (ref 7–18)
Bilirubin,Total: 0.7 mg/dL (ref 0.2–1.0)
Calcium, Total: 9.3 mg/dL (ref 8.5–10.1)
Chloride: 105 mmol/L (ref 98–107)
Co2: 28 mmol/L (ref 21–32)
Creatinine: 1.18 mg/dL (ref 0.60–1.30)
EGFR (African American): >60
EGFR (Non-African Amer.): 56 — ABNORMAL LOW
Glucose: 109 mg/dL — ABNORMAL HIGH (ref 65–99)
Osmolality: 283 (ref 275–301)
Potassium: 3.9 mmol/L (ref 3.5–5.1)
SGOT(AST): 20 U/L (ref 15–37)
SGPT (ALT): 38 U/L (ref 12–78)
Sodium: 142 mmol/L (ref 136–145)
Total Protein: 6.4 g/dL (ref 6.4–8.2)

## 2013-01-20 LAB — MAGNESIUM: Magnesium: 1.7 mg/dL — ABNORMAL LOW

## 2013-01-21 ENCOUNTER — Ambulatory Visit: Payer: Self-pay | Admitting: Oncology

## 2013-02-21 ENCOUNTER — Ambulatory Visit: Payer: Self-pay | Admitting: Oncology

## 2013-04-14 ENCOUNTER — Ambulatory Visit: Payer: Self-pay | Admitting: Oncology

## 2013-04-23 ENCOUNTER — Ambulatory Visit: Payer: Self-pay | Admitting: Oncology

## 2013-04-28 ENCOUNTER — Ambulatory Visit: Payer: Self-pay | Admitting: Oncology

## 2013-04-30 LAB — CBC CANCER CENTER
Basophil #: 0.1 x10 3/mm (ref 0.0–0.1)
Basophil %: 2 %
Eosinophil #: 0.1 x10 3/mm (ref 0.0–0.7)
Eosinophil %: 2.6 %
HCT: 39.8 % (ref 35.0–47.0)
HGB: 13.8 g/dL (ref 12.0–16.0)
Lymphocyte #: 0.7 x10 3/mm — ABNORMAL LOW (ref 1.0–3.6)
Lymphocyte %: 15.4 %
MCH: 31 pg (ref 26.0–34.0)
MCHC: 34.7 g/dL (ref 32.0–36.0)
MCV: 89 fL (ref 80–100)
Monocyte #: 0.3 x10 3/mm (ref 0.2–0.9)
Monocyte %: 5.5 %
Neutrophil #: 3.6 x10 3/mm (ref 1.4–6.5)
Neutrophil %: 74.5 %
Platelet: 210 x10 3/mm (ref 150–440)
RBC: 4.46 10*6/uL (ref 3.80–5.20)
RDW: 12.7 % (ref 11.5–14.5)
WBC: 4.8 x10 3/mm (ref 3.6–11.0)

## 2013-04-30 LAB — COMPREHENSIVE METABOLIC PANEL
Albumin: 4.1 g/dL (ref 3.4–5.0)
Alkaline Phosphatase: 103 U/L (ref 50–136)
Anion Gap: 7 (ref 7–16)
BUN: 11 mg/dL (ref 7–18)
Bilirubin,Total: 0.5 mg/dL (ref 0.2–1.0)
Calcium, Total: 8.7 mg/dL (ref 8.5–10.1)
Chloride: 104 mmol/L (ref 98–107)
Co2: 31 mmol/L (ref 21–32)
Creatinine: 1.03 mg/dL (ref 0.60–1.30)
EGFR (African American): >60
EGFR (Non-African Amer.): >60
Glucose: 104 mg/dL — ABNORMAL HIGH (ref 65–99)
Osmolality: 283 (ref 275–301)
Potassium: 4.5 mmol/L (ref 3.5–5.1)
SGOT(AST): 16 U/L (ref 15–37)
SGPT (ALT): 27 U/L (ref 12–78)
Sodium: 142 mmol/L (ref 136–145)
Total Protein: 7 g/dL (ref 6.4–8.2)

## 2013-04-30 LAB — LACTATE DEHYDROGENASE: LDH: 186 U/L (ref 81–246)

## 2013-04-30 LAB — SEDIMENTATION RATE: Erythrocyte Sed Rate: 5 mm/hr (ref 0–20)

## 2013-04-30 LAB — MAGNESIUM: Magnesium: 1.7 mg/dL — ABNORMAL LOW

## 2013-05-24 ENCOUNTER — Ambulatory Visit: Payer: Self-pay | Admitting: Oncology

## 2013-07-30 ENCOUNTER — Ambulatory Visit: Payer: Self-pay | Admitting: Oncology

## 2013-07-30 LAB — CBC CANCER CENTER
Basophil #: 0 x10 3/mm (ref 0.0–0.1)
Basophil %: 0.7 %
Eosinophil #: 0.1 x10 3/mm (ref 0.0–0.7)
Eosinophil %: 2 %
HCT: 39 % (ref 35.0–47.0)
HGB: 12.9 g/dL (ref 12.0–16.0)
Lymphocyte #: 1.4 x10 3/mm (ref 1.0–3.6)
Lymphocyte %: 25.5 %
MCH: 29.3 pg (ref 26.0–34.0)
MCHC: 33.2 g/dL (ref 32.0–36.0)
MCV: 88 fL (ref 80–100)
Monocyte #: 0.4 x10 3/mm (ref 0.2–0.9)
Monocyte %: 7.4 %
Neutrophil #: 3.5 x10 3/mm (ref 1.4–6.5)
Neutrophil %: 64.4 %
Platelet: 205 x10 3/mm (ref 150–440)
RBC: 4.42 10*6/uL (ref 3.80–5.20)
RDW: 12.8 % (ref 11.5–14.5)
WBC: 5.4 x10 3/mm (ref 3.6–11.0)

## 2013-07-30 LAB — COMPREHENSIVE METABOLIC PANEL
Albumin: 4 g/dL (ref 3.4–5.0)
Alkaline Phosphatase: 85 U/L
Anion Gap: 9 (ref 7–16)
BUN: 13 mg/dL (ref 7–18)
Bilirubin,Total: 0.6 mg/dL (ref 0.2–1.0)
Calcium, Total: 9.2 mg/dL (ref 8.5–10.1)
Chloride: 105 mmol/L (ref 98–107)
Co2: 28 mmol/L (ref 21–32)
Creatinine: 1.04 mg/dL (ref 0.60–1.30)
EGFR (African American): >60
EGFR (Non-African Amer.): >60
Glucose: 98 mg/dL (ref 65–99)
Osmolality: 283 (ref 275–301)
Potassium: 4.2 mmol/L (ref 3.5–5.1)
SGOT(AST): 17 U/L (ref 15–37)
SGPT (ALT): 28 U/L (ref 12–78)
Sodium: 142 mmol/L (ref 136–145)
Total Protein: 6.7 g/dL (ref 6.4–8.2)

## 2013-07-30 LAB — MAGNESIUM: Magnesium: 1.9 mg/dL

## 2013-07-30 LAB — SEDIMENTATION RATE: Erythrocyte Sed Rate: 6 mm/hr (ref 0–20)

## 2013-07-30 LAB — LACTATE DEHYDROGENASE: LDH: 157 U/L (ref 81–246)

## 2013-08-24 ENCOUNTER — Ambulatory Visit: Payer: Self-pay | Admitting: Oncology

## 2013-09-05 ENCOUNTER — Ambulatory Visit: Payer: Self-pay | Admitting: Obstetrics and Gynecology

## 2013-09-21 ENCOUNTER — Ambulatory Visit: Payer: Self-pay | Admitting: Oncology

## 2013-10-28 ENCOUNTER — Ambulatory Visit: Payer: Self-pay | Admitting: Oncology

## 2013-10-29 ENCOUNTER — Ambulatory Visit: Payer: Self-pay | Admitting: Oncology

## 2013-10-30 LAB — CBC CANCER CENTER
Basophil #: 0 x10 3/mm (ref 0.0–0.1)
Basophil %: 0.6 %
Eosinophil #: 0.1 x10 3/mm (ref 0.0–0.7)
Eosinophil %: 1.6 %
HCT: 39.3 % (ref 35.0–47.0)
HGB: 13 g/dL (ref 12.0–16.0)
Lymphocyte #: 1.5 x10 3/mm (ref 1.0–3.6)
Lymphocyte %: 23.2 %
MCH: 29.1 pg (ref 26.0–34.0)
MCHC: 33 g/dL (ref 32.0–36.0)
MCV: 88 fL (ref 80–100)
Monocyte #: 0.4 x10 3/mm (ref 0.2–0.9)
Monocyte %: 6.1 %
Neutrophil #: 4.5 x10 3/mm (ref 1.4–6.5)
Neutrophil %: 68.5 %
Platelet: 197 x10 3/mm (ref 150–440)
RBC: 4.46 10*6/uL (ref 3.80–5.20)
RDW: 13 % (ref 11.5–14.5)
WBC: 6.6 x10 3/mm (ref 3.6–11.0)

## 2013-10-30 LAB — COMPREHENSIVE METABOLIC PANEL
Albumin: 3.9 g/dL (ref 3.4–5.0)
Alkaline Phosphatase: 86 U/L
Anion Gap: 7 (ref 7–16)
BUN: 12 mg/dL (ref 7–18)
Bilirubin,Total: 0.4 mg/dL (ref 0.2–1.0)
Calcium, Total: 9.3 mg/dL (ref 8.5–10.1)
Chloride: 104 mmol/L (ref 98–107)
Co2: 31 mmol/L (ref 21–32)
Creatinine: 0.98 mg/dL (ref 0.60–1.30)
EGFR (African American): >60
EGFR (Non-African Amer.): >60
Glucose: 102 mg/dL — ABNORMAL HIGH (ref 65–99)
Osmolality: 283 (ref 275–301)
Potassium: 4.2 mmol/L (ref 3.5–5.1)
SGOT(AST): 14 U/L — ABNORMAL LOW (ref 15–37)
SGPT (ALT): 24 U/L (ref 12–78)
Sodium: 142 mmol/L (ref 136–145)
Total Protein: 7 g/dL (ref 6.4–8.2)

## 2013-10-30 LAB — MAGNESIUM: Magnesium: 1.7 mg/dL — ABNORMAL LOW

## 2013-10-30 LAB — TSH: Thyroid Stimulating Horm: 2.94 u[IU]/mL

## 2013-10-30 LAB — LACTATE DEHYDROGENASE: LDH: 166 U/L (ref 81–246)

## 2013-11-21 ENCOUNTER — Ambulatory Visit: Payer: Self-pay | Admitting: Oncology

## 2013-12-22 ENCOUNTER — Ambulatory Visit: Payer: Self-pay | Admitting: Oncology

## 2013-12-29 LAB — MAGNESIUM: Magnesium: 1.7 mg/dL — ABNORMAL LOW

## 2013-12-29 LAB — POTASSIUM: Potassium: 3.9 mmol/L (ref 3.5–5.1)

## 2014-01-21 ENCOUNTER — Ambulatory Visit: Payer: Self-pay | Admitting: Oncology

## 2014-01-29 LAB — CBC CANCER CENTER
Basophil #: 0 x10 3/mm (ref 0.0–0.1)
Basophil %: 0.6 %
Eosinophil #: 0.1 x10 3/mm (ref 0.0–0.7)
Eosinophil %: 1.6 %
HCT: 39 % (ref 35.0–47.0)
HGB: 13.3 g/dL (ref 12.0–16.0)
Lymphocyte #: 1.1 x10 3/mm (ref 1.0–3.6)
Lymphocyte %: 23.5 %
MCH: 31.5 pg (ref 26.0–34.0)
MCHC: 34 g/dL (ref 32.0–36.0)
MCV: 93 fL (ref 80–100)
Monocyte #: 0.3 x10 3/mm (ref 0.2–0.9)
Monocyte %: 5.8 %
Neutrophil #: 3.3 x10 3/mm (ref 1.4–6.5)
Neutrophil %: 68.5 %
Platelet: 196 x10 3/mm (ref 150–440)
RBC: 4.22 10*6/uL (ref 3.80–5.20)
RDW: 13.2 % (ref 11.5–14.5)
WBC: 4.9 x10 3/mm (ref 3.6–11.0)

## 2014-01-29 LAB — COMPREHENSIVE METABOLIC PANEL
Albumin: 3.8 g/dL (ref 3.4–5.0)
Alkaline Phosphatase: 87 U/L
Anion Gap: 5 — ABNORMAL LOW (ref 7–16)
BUN: 9 mg/dL (ref 7–18)
Bilirubin,Total: 0.5 mg/dL (ref 0.2–1.0)
Calcium, Total: 9.1 mg/dL (ref 8.5–10.1)
Chloride: 105 mmol/L (ref 98–107)
Co2: 32 mmol/L (ref 21–32)
Creatinine: 1.05 mg/dL (ref 0.60–1.30)
EGFR (African American): >60
EGFR (Non-African Amer.): >60
Glucose: 103 mg/dL — ABNORMAL HIGH (ref 65–99)
Osmolality: 282 (ref 275–301)
Potassium: 4.5 mmol/L (ref 3.5–5.1)
SGOT(AST): 19 U/L (ref 15–37)
SGPT (ALT): 27 U/L (ref 12–78)
Sodium: 142 mmol/L (ref 136–145)
Total Protein: 7.1 g/dL (ref 6.4–8.2)

## 2014-01-29 LAB — MAGNESIUM: Magnesium: 1.8 mg/dL

## 2014-01-29 LAB — LACTATE DEHYDROGENASE: LDH: 188 U/L (ref 81–246)

## 2014-02-21 ENCOUNTER — Ambulatory Visit: Payer: Self-pay | Admitting: Oncology

## 2014-03-16 LAB — COMPREHENSIVE METABOLIC PANEL
Albumin: 3.6 g/dL (ref 3.4–5.0)
Alkaline Phosphatase: 87 U/L
Anion Gap: 9 (ref 7–16)
BUN: 12 mg/dL (ref 7–18)
Bilirubin,Total: 0.3 mg/dL (ref 0.2–1.0)
Calcium, Total: 8.4 mg/dL — ABNORMAL LOW (ref 8.5–10.1)
Chloride: 104 mmol/L (ref 98–107)
Co2: 30 mmol/L (ref 21–32)
Creatinine: 1.04 mg/dL (ref 0.60–1.30)
EGFR (African American): >60
EGFR (Non-African Amer.): >60
Glucose: 97 mg/dL (ref 65–99)
Osmolality: 285 (ref 275–301)
Potassium: 4.3 mmol/L (ref 3.5–5.1)
SGOT(AST): 20 U/L (ref 15–37)
SGPT (ALT): 31 U/L
Sodium: 143 mmol/L (ref 136–145)
Total Protein: 6.9 g/dL (ref 6.4–8.2)

## 2014-03-16 LAB — CBC CANCER CENTER
Basophil #: 0 x10 3/mm (ref 0.0–0.1)
Basophil %: 0.6 %
Eosinophil #: 0.1 x10 3/mm (ref 0.0–0.7)
Eosinophil %: 2.4 %
HCT: 39.3 % (ref 35.0–47.0)
HGB: 13 g/dL (ref 12.0–16.0)
Lymphocyte #: 1.7 x10 3/mm (ref 1.0–3.6)
Lymphocyte %: 29.5 %
MCH: 30.2 pg (ref 26.0–34.0)
MCHC: 33.1 g/dL (ref 32.0–36.0)
MCV: 91 fL (ref 80–100)
Monocyte #: 0.3 x10 3/mm (ref 0.2–0.9)
Monocyte %: 5.4 %
Neutrophil #: 3.7 x10 3/mm (ref 1.4–6.5)
Neutrophil %: 62.1 %
Platelet: 198 x10 3/mm (ref 150–440)
RBC: 4.31 10*6/uL (ref 3.80–5.20)
RDW: 12.8 % (ref 11.5–14.5)
WBC: 5.9 x10 3/mm (ref 3.6–11.0)

## 2014-03-16 LAB — LACTATE DEHYDROGENASE: LDH: 188 U/L (ref 81–246)

## 2014-03-24 ENCOUNTER — Ambulatory Visit: Payer: Self-pay | Admitting: Oncology

## 2014-04-02 DIAGNOSIS — M7710 Lateral epicondylitis, unspecified elbow: Secondary | ICD-10-CM | POA: Insufficient documentation

## 2014-04-13 ENCOUNTER — Ambulatory Visit: Payer: Self-pay | Admitting: Oncology

## 2014-04-13 DIAGNOSIS — M222X9 Patellofemoral disorders, unspecified knee: Secondary | ICD-10-CM | POA: Insufficient documentation

## 2014-04-23 ENCOUNTER — Ambulatory Visit: Payer: Self-pay | Admitting: Oncology

## 2014-05-24 ENCOUNTER — Ambulatory Visit: Payer: Self-pay | Admitting: Oncology

## 2014-06-23 ENCOUNTER — Ambulatory Visit: Payer: Self-pay | Admitting: Oncology

## 2014-08-19 ENCOUNTER — Ambulatory Visit: Payer: Self-pay | Admitting: Oncology

## 2014-08-19 LAB — COMPREHENSIVE METABOLIC PANEL
Albumin: 3.7 g/dL (ref 3.4–5.0)
Alkaline Phosphatase: 81 U/L (ref 46–116)
Anion Gap: 9 (ref 7–16)
BUN: 9 mg/dL (ref 7–18)
Bilirubin,Total: 0.5 mg/dL (ref 0.2–1.0)
Calcium, Total: 8.6 mg/dL (ref 8.5–10.1)
Chloride: 103 mmol/L (ref 98–107)
Co2: 29 mmol/L (ref 21–32)
Creatinine: 1.07 mg/dL (ref 0.60–1.30)
EGFR (African American): >60
EGFR (Non-African Amer.): 59 — ABNORMAL LOW
Glucose: 103 mg/dL — ABNORMAL HIGH (ref 65–99)
Osmolality: 280 (ref 275–301)
Potassium: 4.3 mmol/L (ref 3.5–5.1)
SGOT(AST): 18 U/L (ref 15–37)
SGPT (ALT): 36 U/L (ref 14–63)
Sodium: 141 mmol/L (ref 136–145)
Total Protein: 7 g/dL (ref 6.4–8.2)

## 2014-08-19 LAB — CBC CANCER CENTER
Basophil #: 0.1 x10 3/mm (ref 0.0–0.1)
Basophil %: 0.9 %
Eosinophil #: 0.1 x10 3/mm (ref 0.0–0.7)
Eosinophil %: 1.4 %
HCT: 39.7 % (ref 35.0–47.0)
HGB: 13.1 g/dL (ref 12.0–16.0)
Lymphocyte #: 1.8 x10 3/mm (ref 1.0–3.6)
Lymphocyte %: 28.1 %
MCH: 29.6 pg (ref 26.0–34.0)
MCHC: 32.9 g/dL (ref 32.0–36.0)
MCV: 90 fL (ref 80–100)
Monocyte #: 0.4 x10 3/mm (ref 0.2–0.9)
Monocyte %: 6.3 %
Neutrophil #: 4 x10 3/mm (ref 1.4–6.5)
Neutrophil %: 63.3 %
Platelet: 210 x10 3/mm (ref 150–440)
RBC: 4.43 10*6/uL (ref 3.80–5.20)
RDW: 13.1 % (ref 11.5–14.5)
WBC: 6.3 x10 3/mm (ref 3.6–11.0)

## 2014-08-19 LAB — LACTATE DEHYDROGENASE: LDH: 184 U/L (ref 81–246)

## 2014-08-19 LAB — CREATININE, SERUM: Creatine, Serum: 1.07

## 2014-08-24 ENCOUNTER — Ambulatory Visit: Payer: Self-pay | Admitting: Oncology

## 2014-09-22 ENCOUNTER — Ambulatory Visit
Admit: 2014-09-22 | Disposition: A | Discharge status: Home / Self Care | Payer: Self-pay | Attending: Oncology | Admitting: Oncology

## 2014-10-23 ENCOUNTER — Ambulatory Visit
Admit: 2014-10-23 | Disposition: A | Discharge status: Home / Self Care | Payer: Self-pay | Attending: Oncology | Admitting: Oncology

## 2014-11-10 NOTE — Discharge Summary (Signed)
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 The patient was admitted for high dose methotrexate; status post hysterectomy.   DISCHARGE MEDICATIONS:  1. Zofran 4 mg q.4 to 6 hours p.r.n. for nausea. 2. Oxycodone 5 mg q.8 hours p.r.n. for pain.  3. Tylenol 325 mg 1 or 2 tablets q.4 to 6 hours p.r.n. for pain as needed.   DIET: Regular.   ACTIVITY: As tolerated.   FOLLOW-UP: The patient is to come back and see me on Monday, August 5th, for continuation of chemotherapy.   HISTORY OF PRESENT ILLNESS: Ms. Schmutz is a known patient to me for diffuse large cell lymphoma getting chemotherapy with Rituxan, Cytoxan, Adriamycin, Oncovin, and prednisone, has finished two cycles of chemotherapy. On day 15 the patient was admitted for high dose methotrexate chemotherapy.   The patient's past history, social, family history, and examination has been dictated and noted on the chart.   HOSPITAL COURSE: During the hospital stay the patient was admitted, was started on IV fluid, IV bicarb. Urine pH has been maintained more than 7. The patient underwent total 3 grams per meter square methotrexate over a period of four hours infusion. Twenty-four hours after that the patient started on 15 mg/sq m leucovorin given every six hours. Methotrexate levels were monitored at 24, 48, and 72 hours and they were all within acceptable range. The patient did not develop any significant nausea, vomiting, or diarrhea. He had some epigastric discomfort which resolved. No soreness in the mouth. The patient was then discharged and advised to get follow-up as outpatient.   OVERALL PROGNOSIS: Fair considering underlying disease.   ____________________________ Martie Lee. Rakiyah Esch, MD jkc:drc D: 03/04/2012 08:30:06 ET T: 03/04/2012 14:42:11 ET JOB#: [id number]  cc: Delorise Shiner K. Oliva Bustard, MD,  <Dictator> Jobe Gibbon MD ELECTRONICALLY SIGNED 03/04/2012 22:01

## 2014-11-10 NOTE — Discharge Summary (Signed)
PATIENT NAME:  Bethany Kelly, Bethany Kelly MR#:  [id number] DATE OF BIRTH:  12-24-1970  DATE OF ADMISSION:  05/28/2012 DATE OF DISCHARGE:  06/01/2012  FINAL DIAGNOSIS: Diffuse large cell lymphoma. Patient was admitted for high dose methotrexate.   HISTORY OF PRESENT ILLNESS: This 44 year old lady with diffuse large cell lymphoma involving bone, bone marrow, liver had finished order six cycles of chemotherapy, was admitted in the hospital for high dose methotrexate treatment. This is the third and the last chemotherapy cycle.   Patient also had >> herpes zoster, which is healing well. Did not have any other significant complaints.   PAST MEDICAL HISTORY, SOCIAL HISTORY, FAMILY HISTORY: Has been dictated note on the chart.    HOSPITAL COURSE: During hospital stay patient received 5400 mg of methotrexate. Intravenous fluids, bicarbonate was given. pH was maintained 7 and above.   Patient tolerated treatment very well without any significant nausea, vomiting, diarrhea.   DISCHARGE MEDICATIONS:  1. Vicodin 1 tablet p.r.n. for pain.  2. Prilosec 20 mg p.o. b.i.d.   OVERALL PROGNOSIS: Guarded.   DIET: Regular.   ACTIVITY: As tolerated.     FOLLOW UP: Patient would be seen by me as outpatient with further PET scan and bone marrow aspiration and biopsy.   ____________________________ Martie Lee. Camelle Henkels, MD jkc:cms D: 06/11/2012 34:91:79 ET T: 06/12/2012 10:22:09 ET JOB#: [id number]  cc: Delorise Shiner K. Oliva Bustard, MD, <Dictator> Jobe Gibbon MD ELECTRONICALLY SIGNED 06/17/2012 9:01

## 2014-11-15 NOTE — Op Note (Signed)
PATIENT NAME:  Bethany Kelly, Bethany Kelly MR#:  [id number] DATE OF BIRTH:  30-Dec-1970  DATE OF PROCEDURE:  01/11/2012  PREOPERATIVE DIAGNOSIS: Lymphoma with poor venous access.   POSTOPERATIVE DIAGNOSIS: Lymphoma with poor venous access.  PROCEDURES PERFORMED: 1. Ultrasound guidance for vascular access, right jugular vein.  2. Fluoroscopic guidance for placement of catheter.  3. Placement of CT compatible Infuse-a-Port, right jugular vein.  SURGEON: Leotis Pain, MD  ANESTHESIA: Local with moderate conscious sedation.  ESTIMATED BLOOD LOSS: 25 mL.  INDICATION FOR PROCEDURE: This is a 44 year old female with newly diagnosed malignancy which is felt to be a lymphoma with retroperitoneal mass. She is starting chemotherapy tomorrow and needs a Port-A-Cath for access.   DESCRIPTION OF PROCEDURE: The patient was brought to the vascular interventional radiology suite. The right jugular vein was visualized with ultrasound and found to widely patent. It was accessed under direct ultrasound guidance without difficulty and a permanent image was recorded. A peel-away sheath was then placed over the wire and the wire and dilator were removed. I then turned my attention to the subclavicular region. I anesthetized an area two fingerbreadths below the right clavicle and created an inferior pocket and secured the port with two Prolene sutures. The catheter was then tunneled from the subclavicular incision to the access site and connected to the port. Fluoroscopic guidance used to cut the catheter to an appropriate length. It was then placed through the peel-away sheath and the peel-away sheath was removed. The catheter tip was found to be in excellent location just into the right atrium without kink. It withdrew blood well and flushed easily with heparinized saline. The wound was irrigated with     antibiotic-impregnated saline and closed with 3-0 Vicryl and 4-0 Monocryl. The access incision was closed with single 4-0  Monocryl and Dermabond was placed as a dressing. The patient tolerated the procedure well.  ____________________________ Algernon Huxley, MD jsd:slb D: 01/11/2012 13:00:51 ET T: 01/11/2012 13:07:36 ET JOB#: [id number]  cc: Algernon Huxley, MD, <Dictator> Martie Lee. Oliva Bustard, MD Algernon Huxley MD ELECTRONICALLY SIGNED 01/18/2012 9:22

## 2014-11-15 NOTE — H&P (Signed)
PATIENT NAME:  Bethany Kelly, Bethany Kelly MR#:  [id number] DATE OF BIRTH:  06/24/1971  DATE OF ADMISSION:  01/03/2012  REFERRING PHYSICIAN: Dr. Cinda Quest.   PRIMARY CARE PHYSICIAN:  Briscoe Deutscher, D.O., [facility].   CHIEF COMPLAINT: "I've been feeling weak and nauseous and losing weight."   HISTORY OF PRESENT ILLNESS: The patient is a 44 year old female with past medical history as listed below who presents to the Emergency Department with the above-mentioned chief complaint. The patient states that over the past couple of months she has had malaise, fatigue, generalized weakness and fevers up to 101.6. She has had an approximate 20 pound weight loss over the past 1 to 2 months. She denies abdominal pain, but reports upset stomach with nausea and occasional vomiting. She has been on four rounds of antibiotics, but has still been febrile. She has been treated for a possible infected tooth and also for a possible urinary tract infection. She had a total vaginal hysterectomy 12/04/2011 and repair of rectal prolapse and bladder tack and since that time she has had nausea and occasional vomiting. She has been at [facility] as well as at [facility]. She was noted to have abnormal blood work and recently diagnosed with acute renal failure, hypercalcemia, anemia, and hypoalbuminemia and has been referred to see a hematologist, but is currently awaiting an appointment. She has also had a history of possible tickborne illness and has been treated with doxycycline of unknown duration. Right now she reports malaise, fatigue, generalized weakness, ongoing fevers over the past couple of months and weight loss as well as decreased p.o. intake and nausea and vomiting. She was at [facility] today to obtain labs and after she provided a urine specimen she vomited and thereafter she was sent to the ER. In the ER she was noted to have multiple lab abnormalities and was noted to be pancytopenic and also with evidence of  acute renal failure and hypercalcemia as well as evidence of urinary tract infection. CT of the abdomen and pelvis without contrast was obtained revealing innumerable low attenuation lesions throughout the liver as well as retroperitoneal lymphadenopathy and a large round mass medial to the right kidney and mildly enlarged spleen and thereafter, hospitalist services were contacted for further evaluation and for hospital admission.   PAST SURGICAL HISTORY:  1. Total vaginal hysterectomy 12/04/2011 with anterior and posterior colporrhaphy with repair of rectal prolapse and bladder tack.  2. Tubal ligation.   PAST MEDICAL HISTORY:  1. Former tobacco abuse.  2. Former alcohol abuse.  3. Bladder and rectal prolapse status post surgical repair.  4. History of hysterectomy.  5. Possible tickborne illness for which she received doxycycline of unknown duration.  6. Recent diagnosis of anemia, acute renal failure, hypercalcemia, hypoalbuminemia and she has been referred to hematology.  7. Rhinosinusitis.  8. Recently diagnosed urinary tract infection.   ALLERGIES: Penicillin causes her to stop breathing and become apneic and could be an anaphylactic reaction.   HOME MEDICATIONS:  1. Unknown sinus medication. 2. Levaquin for which she was prescribed a 10 day course and has taken six days. 3. P.r.n. ibuprofen.  4. Recently started on iron supplement 325 mg p.o. daily.   FAMILY HISTORY: Unknown as the patient is adopted. She is not aware of any medical history regarding her biological parents, not sure if there is any family history of cancer or malignancy.   SOCIAL HISTORY: Tobacco, quit 12-13 years ago. In the past she has smoked 1 pack per day  for approximately 30 years. Alcohol, heavy abuse in the past, last drink in 1997. Illicit drugs, none. The patient lives in [place], [place] with her husband. She has three children.   REVIEW OF SYSTEMS: CONSTITUTIONAL: Reports malaise, fever,  fatigue, generalized weakness. Denies any pain. Has had some weight loss recently. HEAD/EYES: Denies headache or blurry vision. ENT: Denies tinnitus, earache, nasal discharge, or sore throat. RESPIRATORY: Denies shortness breath, cough, or wheezing. CARDIOVASCULAR: Denies chest pain, heart palpitations, lower extremity edema. GASTROINTESTINAL: Reports nausea, vomiting. Denies diarrhea, constipation, melena, hematochezia, or abdominal pain. GU: Denies dysuria or hematuria. ENDOCRINE: Denies heat or cold intolerance. HEME/LYMPH: Denies easy bruising or bleeding. INTEGUMENTARY: Denies rash or lesions. MUSCULOSKELETAL: Denies joint pain or back pain. Has generalized muscle weakness. NEUROLOGIC: Denies headache, numbness, tingling, dysarthria or generalized weakness. PSYCH: Denies depression or anxiety. Denies suicidal or homicidal ideation.   PHYSICAL EXAMINATION:  VITAL SIGNS: Temperature 99.4, pulse 100, blood pressure 144/83, respirations 20, oxygen saturation 98% on room air.   GENERAL: Pleasant female, alert and oriented, not acutely distressed.   HEENT: Normocephalic, atraumatic. Pupils are equal, round and reactive to light accommodation. Extraocular muscles are intact. Anicteric sclerae. Bilateral conjunctival pallor. Hearing intact to voice. Nares without drainage. Oral mucosa moist without lesions.   NECK: Supple with full range of motion. No jugular venous distention, lymphadenopathy, or carotid bruits bilaterally. No thyromegaly or tenderness to palpation over thyroid gland.   CHEST: Normal respiratory effort without use of accessory respiratory muscles. Lungs are clear to auscultation bilaterally without crackles, rales, or wheezing.   CARDIOVASCULAR: S1, S2 positive, slightly tachycardic. No murmurs, rubs or gallops. PMI is non-lateralized.   ABDOMEN: Soft, nontender, nondistended. Normoactive bowel sounds. No hepatosplenomegaly or palpable masses. No hernias.   EXTREMITIES: No clubbing,  cyanosis, or edema. Pedal pulses are palpable bilaterally.   SKIN: No suspicious rashes. Skin turgor is good.   LYMPH: No cervical lymphadenopathy.   NEUROLOGIC: Alert and oriented x3. Cranial nerves II through XII are grossly intact. No focal deficits.   PSYCH: Appropriate affect.   LABORATORY, RADIOLOGICAL AND DIAGNOSTIC DATA: CT of the abdomen and pelvis without contrast: Innumerable low attenuation lesions throughout the liver which are indeterminate and incompletely characterized given lack of IV contrast. Differential would include metastatic disease among other infiltrative etiology. Lymphoma would also be in the differential given lymphadenopathy in the retroperitoneum. Further evaluation with multiphasic contrast enhanced CT or MRI is recommended. There are also multiple enlarged retroperitoneal lymph nodes. There is a large round mass medial to the right kidney which is uncertain etiology, but could represent an enlarged lymph node versus other mass and this measures 3.5 x 3.3 cm. The spleen is mildly enlarged. Mild thickening of the left adrenal gland which is nonspecific. Urinalysis: Cloudy urine with 3+ leukocyte esterase, 53 WBCs, 3 RBCs, trace bacteria, negative nitrite. CBC: WBC 3.1, hemoglobin 9.5, hematocrit 29.3, platelets 147, MCV 77. CMP: Sodium 134, potassium 4.2, chloride 97, bicarbonate 26, BUN 28, creatinine 1.42, glucose 86, calcium 13.6, anion gap 11, total protein 5.2, albumin 2.7, total bilirubin 0.7, AST 54, ALT 27, alkaline phosphatase 140, lipase 143. Blood cultures were drawn from 12/28/2011 which did not reveal any growth to date. EKG: Sinus tachycardia with heart rate 112 beats per minute with normal axis, normal intervals, no acute ST or T wave changes.   ASSESSMENT AND PLAN: A 44 year old female with recently diagnosed anemia, acute renal failure, hypercalcemia, hypoalbuminemia, also a history of former tobacco and alcohol abuse, here with two  month history of  malaise, fatigue, and generalized weakness, nausea, vomiting, weight loss as well as fevers, found to have an abnormal CT with multiple hepatic lesions as well as other metabolic derangements.  1. Innumerable hepatic lesions with multiple enlarged retroperitoneal lymph nodes and right perinephric mass, suspicious for malignancy and metastatic disease of unknown primary and will recommend hospital admission for further work-up and management. We will admit the patient to the oncology ward. Will admit the patient for malignancy work-up. Cancer is likely the cause of all of her symptoms including malaise, fatigue, weakness, fevers, weight loss, nausea and vomiting and decreased p.o. intake/anorexia. Will perform malignancy work-up. This could be metastatic colon cancer versus lung cancer given her history of smoking. She may also have possible breast cancer. An exact etiology is unknown at this time. She will likely need a CT of the chest versus a PET scan and also a head CT as well as work-up for possible breast cancer and will need full malignancy work-up. We will also send off some tumor markers. She will likely also need a PET scan. We will defer cancer work-up to oncology and Dr. Oliva Bustard has been notified in the ER and is already aware and will be seeing the patient in consultation. In the meanwhile, will provide supportive care with IV fluids, pain control as needed, although she denies any acute pain, and provide p.r.n. antiemetics and start with clear liquid diet as she has some nausea and vomiting and advance to regular diet as tolerated.  2. Hypercalcemia, possibly due to malignancy. Will give a dose of Zometa and follow serum calcium level and also check intact PTH, PTH related peptide, check TSH and also vitamin D levels. We will also keep the patient on IV hydration which should help lead to further improvement of her serum calcium level and obtain repeat serum potassium level in the morning.  3. Acute  renal failure, could be prerenal etiology from decreased p.o. intake and vomiting. We will follow the patient's renal function with hydration and also obtain a renal ultrasound given right perinephric mass which was noted and avoid nephrotoxins and hold NSAID therapy for now and monitor ins and outs closely.  4. Urinary tract infection. Obtain urine culture and keep the patient on IV Levaquin for now. Of note, her urinalysis looks worse compared to her most recent output urinalysis, which was obtained and not sure if she has an organism that is resistant to Levaquin. She does, however, have an anaphylactic reaction to penicillin as this causes her to stop breathing and develop bronchospasm. Therefore, would use alternate agent cautiously. May need to consider ID evaluation. For now, we will keep her on IV Levaquin pending culture results. Currently, there is no evidence of sepsis.  5. Pancytopenia. This could be from malignancy as well. She has microcytic anemia. Will await hematology/oncology evaluation. There are no acute indications for blood transfusion as she is hemodynamically stable currently. Will follow her CBC closely. Given some microcytic anemia, also check iron panel and ferritin level and stool Hemoccults. At this time, we will also check B12, folate, and retic count. Follow CBC closely and await hematology evaluation.  6. Persistent fevers, likely tumor fever, or fever of malignancy as she has completed four rounds of antibiotics. This could also be a resistant urinary tract infection as her urinalysis looks worse compared with the outpatient study. Will keep her on IV Levaquin for now pending urine culture results. As above, she has an anaphylactic reaction to  penicillin and would be cautious to use other antibiotic agents until and unless she is seen by ID and primary team to consider ID consultation, but currently she is afebrile. Cancer work-up is in progress and await hematology/oncology  evaluation as well.  7. Deep venous thrombosis prophylaxis with SCDs and TEDs.  8. CODE STATUS: FULL CODE.   TIME SPENT ON THIS ADMISSION: Approximately 65 minutes.   ____________________________ Romie Jumper, MD knl:ap D: 01/03/2012 19:22:07 ET T: 01/04/2012 07:04:32 ET JOB#: [id number] cc: Romie Jumper, MD, <Dictator> Josefina Do. Juleen China, DO Alfonzo Feller Reilly Molchan MD ELECTRONICALLY SIGNED 01/23/2012 1:23

## 2014-11-15 NOTE — Op Note (Signed)
PATIENT NAME:  Bethany Kelly, HEWARD MR#:  [id number] DATE OF BIRTH:  08/03/1970  DATE OF PROCEDURE:  12/04/2011  PREOPERATIVE DIAGNOSIS: Symptomatic pelvic descensus.   POSTOPERATIVE DIAGNOSIS: Symptomatic pelvic descensus.  PROCEDURE:  1. Total vaginal hysterectomy.  2. Anterior and posterior colporrhaphy.   SURGEON: Ricky L. Amalia Hailey, MD   ASSISTANT: Boykin Nearing, MD   ANESTHESIA: General endotracheal.   FINDINGS: Moderate rectocele, moderate cystocele. Cervical motion noted.   ESTIMATED BLOOD LOSS: Minimal.   COMPLICATIONS: None.   DRAINS/PACKING: Foley, packing present. Both placed at the end of the case.   ANTIBIOTICS: Preoperative antibiotics were given.   PROCEDURE IN DETAIL: Consent was signed. The patient was taken to the Operating Room and placed in the supine position where anesthesia was initiated. She was then placed in the dorsal lithotomy position using Allen stirrups, prepped and draped in the usual sterile fashion. The cervix was visualized with the aid of a weighted speculum and sidewalls, grasped with a single-tooth tenaculum, and injected circumferentially with approximately 10 mL of dilute vasopressin. A scalpel was then used to circumscribe the uterus, and direct entry was made in the posterior cul-de-sac with scissors. The posterior cuff was tagged, and a long weighted was placed.   Bilateral uterosacrals were clamped with Heaney-Ballantine's, divided sharply, and ligated with 0 Vicryl. These were tagged. We proceeded in a similar fashion through the cardinal ligament and across the adnexa, tagging again the adnexal sutures. Areas were inspected for hemostasis and seen to be excellent. Adnexal suture tags were cut, and the cuff was closed with a running interlocking of 0 Vicryl. The uterosacrals were tied together.   Next we turned our attention to the posterior vagina.   Posterior repair was gotten in the usual fashion with the hand of the operator in the  vagina. A large mid central defect was noted which was corrected with 2-0 Ethibond. After good support was restored, skin was trimmed and was closed with a running interlocking of 0 Vicryl in the usual fashion, taking care to build up the perineum.   Anterior repair was then carried out using 2-0 Ethibond to correct the defect and successive pursestring sutures. Skin was trimmed, and the skin was closed with a running interlocking of 3-0 Vicryl.   A Foley was inserted at the end of case with good spillage of clear urine, approximately 300 mL. Packing was placed that was soaked in ABD cream, and the end of packing was tied to Foley.   The patient tolerated the procedure well. Hemostasis was good. Cosmesis was good. Vaginal caliber was acceptable. Support was acceptable.   She will be sent to the floor with routine precautions. She will be discharged home tomorrow after removing the catheter and packing, assuming hemoglobin is normal and the patient is ready for discharge.   ____________________________ [name] L. Amalia Hailey, MD rle:cbb D: 12/04/2011 11:06:32 ET T: 12/04/2011 16:22:17 ET JOB#: [id number]  cc: Ricky L. Amalia Hailey, MD, <Dictator> Selmer Dominion MD ELECTRONICALLY SIGNED 12/05/2011 9:27

## 2014-11-15 NOTE — Discharge Summary (Signed)
PATIENT NAME:  Bethany Kelly, MCKOY MR#:  [id number] DATE OF BIRTH:  January 30, 1971  DATE OF ADMISSION:  12/04/2011 DATE OF DISCHARGE:  12/06/2011  ADMISSION DIAGNOSIS: Symptomatic pelvic organ prolapse.   DISCHARGE DIAGNOSIS: Symptomatic pelvic organ prolapse.   PROCEDURE: Vaginal hysterectomy, anterior and posterior repair. Please see operative note.   HOSPITAL COURSE: Patient did well postoperatively. She had some issues with voiding well on day one therefore Foley was reinserted, watched one more day and on the morning of day two pain was controlled, tolerating diet well, was able to void satisfactorily, was discharged home with routine prescriptions, precautions and follow up.   ____________________________ [name] L. Amalia Hailey, MD rle:cms D: 12/19/2011 16:38:57 ET T: 12/20/2011 12:00:14 ET JOB#: [id number]  cc: Ricky L. Amalia Hailey, MD, <Dictator>  Selmer Dominion MD ELECTRONICALLY SIGNED 12/20/2011 12:17

## 2014-11-15 NOTE — Discharge Summary (Signed)
PATIENT NAME:  Bethany Kelly, Bethany Kelly MR#:  [id number] DATE OF BIRTH:  1971/07/13  DATE OF ADMISSION:  01/03/2012 DATE OF DISCHARGE:  01/06/2012  ADMITTING PHYSICIAN: Dagoberto Ligas, MD  DISCHARGING PHYSICIAN: Gladstone Lighter, MD  PRIMARY CARE PHYSICIAN: None.   CONSULTANTS: Forest Gleason, MD - Oncology.  DISCHARGE DIAGNOSES:  1. Possible underlying malignancy, PET positive, with lightening of the whole skeletal system with hepatic splenic lesions. Tumor markers for CA-125 and CA-27-29 is also positive.  2. Pancytopenia.  3. Anemia likely related to malignancy. No iron deficiency seen on labs. Status post 2 units of packed RBC transfusion this admission.  4. Acute renal failure on admission, which has resolved.  5. Hypercalcemia, which improved with Zometa, likely related to malignancy.  6. Persistent fevers, urinary tract infection versus malignancy. Culture is negative.  7. Left ankle and foot swelling, negative for deep venous thrombosis.  8. Persistent diarrhea.  DISCHARGE MEDICATIONS:  1. Levaquin 500 mg p.o. daily for five more days.  2. Oxycodone 5 mg p.o. every eight hours p.r.n. for pain.  3. Zofran 4 mg oral disintegrating tablet every six hours p.r.n. for nausea and vomiting.   DISCHARGE DIET: Regular diet.   DISCHARGE ACTIVITY: As tolerated.   FOLLOWUP INSTRUCTIONS: Follow up with Dr. Oliva Bustard on 01/07/2012 to followup on bone marrow biopsy results.  RESULTS: WBC at the time of discharge 2.1, hemoglobin 9.1, hematocrit 27.1, and platelet count 112.   Sodium 139, potassium 3.6, chloride 106, bicarbonate 24, BUN 22, creatinine 1.1, glucose 84, and calcium 10.3.   Renal ultrasound is showing mass of uncertain etiology medial to right kidney, but could represent enlarged lymph node.   PET scan is showing innumerable hypermetabolic lesions throughout the spleen and liver concerning for malignancy. Hypermetabolic retroperitoneal anterior mediastinal and supraclavicular  lymphadenopathy consistent with diffuse metastatic disease versus lymphoma. Diffuse hypermetabolic activity throughout the axial and proximal appendicular skeleton most concerning for malignancy give the above findings.   CA-19-9 is 14, within normal limits. CEA is 1.1. AFP is 1.1. CA-125 is 409.3, which is elevated. CA-27-29 is elevated at 114.   CT of the head without contrast is showing no evidence of acute abnormalities.   25 hydroxy vitamin D level is low at 11. TSH is 2.77. Serum iron is 85. Iron binding capacity is 229. Iron saturation is 37%. Ferritin is 1,016. Reticulocyte count is elevated at 1.99% but absolute reticulocyte count is normal. Vitamin B12 is 548.  CT of the abdomen and pelvis without contrast is showing innumerable low attenuation lesions throughout the liver indeterminate and incompletely characterized given lack of IV contrast. Differentials could include metastatic disease and lymphoma can also be in the differential given the lymphadenopathy in the retroperitoneum. There are multiple enlarged retroperitoneal lymph nodes. And large round mass medial to the right kidney, which is of uncertain etiology, but could represent an enlarged lymph node. Spleen is enlarged and mild thickening of left adrenal gland, which could be nonspecific.   BRIEF HOSPITAL COURSE: Ms. Bethany Kelly is a 44 year old young Caucasian female with no significant past medical history who has been having generalized weakness, malaise, fatigue, and fevers as high as 101.6 over the past six months but worsened in the last couple of months. She has been to several urgent care walk-in clinics and also been at [facility] for nausea, vomiting, and inability to eat. She had a total vaginal hysterectomy done for menorrhagia on 12/04/2011 and also for prolapse. Because of her nausea, vomiting, generalized weakness, and weight loss she  had a CT of the abdomen done when she came into the ED which showed innumerable liver lesions and  retroperitoneal lymphadenopathy, possible malignancy needs to be ruled out.  1. Possible malignancy, could be either lymphoma. She was admitted and seen by Dr. Oliva Bustard. We did a PET scan which showed hypermetabolic activity of the entire axial and appendicular skeleton with multiple lymph nodes in the retroperineal region, mediastinal region, liver and spleen. She had a bone marrow biopsy done, the results of which are pending, but leukemia has been ruled out from preliminary reports. Tumor markers show elevated CEA 125 and also CA-27-29. No breast mass on examination, however. She will followup with Dr. Oliva Bustard on 01/07/2012 to followup on the bone marrow results and for further plan.  2. Persistent fevers, likely related to her lymphoma/underlying malignancy. She was treated with several antibiotics as an outpatient and because of her anaphylactic reaction to penicillin she was started on Levaquin while in the hospital and will be discharged on the same until malignancy is confirmed for sure. She also has been having some diarrhea, even prior to admitting to the hospital. She has not had a lot of episodes to get a stool sample yet. Imodium is being prescribed p.r.n. and she can followup with Dr. Forest Gleason for further diarrhea.  3. Pancytopenia with anemia. Could be again related to malignancy if it is bone marrow related like lymphoma. Because of generalized weakness and fatigue she did get 2 units of packed RBC transfusion which improved her hemoglobin appropriately and she will have a follow-up CBC done on 01/07/2012.  4. Acute renal failure secondary to prerenal. IV fluids were given and that improved her renal function.  5. Hypercalcemia. It could be malignancy related. She did get a dose of Zometa and it did improve her calcium level significantly.  6. Left ankle edema. Prior to discharge, she did have Dopplers to make sure there is no deep venous thrombosis, especially in light of her malignancy,  which was negative for any thrombus. However, she states she has a history of swelling of her left leg, even in pregnancy too, so not sure if this is just the way her intraabdominal retroperitoneal lymph nodes are compressing causing impaired venous return from the left leg. Advised elevation and continue to monitor. Her course has been otherwise uneventful in the hospital.   DISCHARGE CONDITION: Stable.         DISCHARGE DISPOSITION: Home.   TIME SPENT ON DISCHARGE: 40 minutes. ____________________________ Gladstone Lighter, MD rk:slb D: 01/07/2012 12:11:56 ET T: 01/08/2012 10:47:10 ET JOB#: [id number]  cc: Gladstone Lighter, MD, <Dictator> Gladstone Lighter MD ELECTRONICALLY SIGNED 01/17/2012 15:23

## 2014-11-15 NOTE — Consult Note (Signed)
History of Present Illness:   Reason for Consult 44 year old lady was admitted in the hospital with hypercalcemia and abnormal CT scan of the abdomen    Date of Diagnosis 12-Jun-20147    HPI   61-year-old lady who recently had a hysterectomy done because of prolapse uterus.  But patient continued to have severe nausea.  Losing weight approximately 20 pounds.  Generalized fatigue.  Fever up to 1 0 1.6.  Admitted in the hospital  . because of hypercalcemia. mild renal failure.  Anemia and pancytopeniaCT scan.not have recent mammogram or colonoscopy.  Patient is a remote smoker.  Does not work at present time  [place]:   Comments Unknown because patient was adopted.    Comments Remote smoker.  Quit smoking 12 years ago.  She had smoked for 30 years prior to that.  Does not drink    Additional Past Medical and Surgical History Bladder and rectal prolapse had a regular hysterectomy in May of 2013.   Review of Systems:   General fever  chills  weakness  fatigue    Performance Status (ECOG) 2    HEENT no complaints    Lungs no complaints    Cardiac no complaints    GI nausea/vomiting  nausea  Significant weight loss    GU no complaints    Musculoskeletal no complaints    Extremities no complaints    Skin no complaints    Neuro no complaints    Endocrine no complaints    Psych anxiety   NURSING NOTES: **Vital Signs.:   13-Jun-13 04:25    Vital Signs Type: Routine    Temperature Temperature (F): 100.5    Temperature Source: Oral    Pulse Pulse: 108    Respirations Respirations: 18    Systolic BP Systolic BP: 324    Diastolic BP (mmHg) Diastolic BP (mmHg): 66    Mean BP: 79    BP Source: vital sign device    Pulse Ox % Pulse Ox %: 96    Pulse Ox Activity Level: At rest    Oxygen Delivery: Room Air/ 21 %   Physical Exam:   General pale  looking lying in the bed.  Not in any acute distress    HEENT: normal    Lungs: clear    Cardiac: regular rate,  rhythm    Breast: R normal  L normal    Abdomen: soft  nontender  positive bowel sounds    Skin: intact    Musculoskeletal: No abnormality    Extremities: No edema, rash or cyanosis    Neuro: AAOx3    Psych: alert and cooperative    Physical Exam LYMPHATICS:   No cervical, axillary, or inguinal lymphadenopathy     AKD:    hypoalbumiinenia:    hpyercalcenia:    anemia:    weight loss:    fatigue:    chronic sinusitis:    prolapes rectal repair:    Hysterectomy - Partial:    Tubal Ligation:    bladder tacking:    Penicillin: Other    levofloxacin 500 mg oral tablet: 1 tab(s) orally once a day for 10 days, Active, 0, None   ibuprofen 200 mg oral tablet: 2 tab(s) orally , As Needed- for Pain , Active, 0, None   ferrous sulfate 325 mg (65 mg elemental iron) oral tablet: 1 tab(s) orally once a day (in the morning), Active, 0, None  Laboratory Results: Routine BB:  13-Jun-13 06:06    Crossmatch Unit 1  Ready (Result(s) reported on 04 Jan 2012 at 08:26AM.)   ABO Group + Rh Type A Positive   Antibody Screen NEGATIVE (Result(s) reported on 04 Jan 2012 at 08:10AM.)  Routine Chem:  13-Jun-13 04:34    Result Comment wbc count - RESULTS VERIFIED BY REPEAT TESTING.  - NOTIFIED OF CRITICAL VALUE  - mpg c/ jeff lush @ 4540 01/04/12  - READ-BACK PROCESS PERFORMED. diff - SMEAR SCANNED  - mpg 01/04/12  - ]  - /  Result(s) reported on 04 Jan 2012 at 07:04AM.   Glucose, Serum 86   BUN  24   Creatinine (comp)  1.33   Sodium, Serum 137   Potassium, Serum 4.2   Chloride, Serum 103   CO2, Serum 25   Calcium (Total), Serum  11.4   Anion Gap 9   Osmolality (calc) 277   eGFR (African American)  57   eGFR (Non-African American)  49 (eGFR values <6m/min/1.73 m2 may be an indication of chronic kidney disease (CKD). Calculated eGFR is useful in patients with stable renal function. The eGFR calculation will not be reliable in acutely ill patients when serum creatinine  is changing rapidly. It is not useful in  patients on dialysis. The eGFR calculation may not be applicable to patients at the low and high extremes of body sizes, pregnant women, and vegetarians.)  Routine Sero:  13-Jun-13 06:27    Occult Blood, Feces NEGATIVE (Result(s) reported on 04 Jan 2012 at 06:59AM.)  Routine Hem:  13-Jun-13 04:34    WBC (CBC)  2.0   RBC (CBC)  2.99   Hemoglobin (CBC)  7.5   Hematocrit (CBC)  22.8   Platelet Count (CBC)  136   MCV  76   MCH  25.0   MCHC 32.8   RDW  22.4   Neutrophil % 83.2   Lymphocyte % 6.7   Monocyte % 9.8   Eosinophil % 0.1   Basophil % 0.2   Neutrophil # 1.7   Lymphocyte #  0.1   Monocyte # 0.2   Eosinophil # 0.0   Basophil # 0.0   Bands 9   Segmented Neutrophils 79   Lymphocytes 7   Monocytes 2   Metamyelocyte 1   Myelocyte 3   Diff Comment 1 ANISOCYTOSIS   Diff Comment 2 POIKILOCYTOSIS   Diff Comment 3 NORMAL PLT MORPHOLGY  Result(s) reported on 04 Jan 2012 at 07:04AM.   Assessment and Plan:  Impression:   1.hypercalcemia Patient received IV fluid and Zometa 3 mg (renal adjusted dosecalcium is improved to 11.4 Anemia and lymphopenia Exact etiology is not clear.  Vitamin BJ81they will folic acid   and ferritin  the normal limit Stool for blood negativeneed bone marrow aspiration and biopsy PET scan has been ordered CT scan has been reviewed shows multiple hypodensities in the liver and soft tissue mass Tumor markers are pending I had prolonged discussion with patient as well as attending physician.  Patient can go home and may need followup on Monday to see me when all tumor markers are available.  And PET scan results are available.  May need blood transfusion 2 units of packed cell has been ordered. I have discussed the risks of transfusions with the patient (including but not limited to): Fluid overload,febrile transfusion reaction, transfusion reaction, Hepatitis B, Hepatitis C, HIV, as well as other currently  unknown infectious complications. The patient will sign a written consent prior to the procedure.  Electronic Signatures: CJobe Gibbon(MD)  (  Signed 13-Jun-13 09:53)  Authored: HISTORY OF PRESENT ILLNESS, PFSH, ROS, NURSING NOTES, PE, PAST MEDICAL HISTORY, ALLERGIES, HOME MEDICATIONS, LABS, ASSESSMENT AND PLAN   Last Updated: 13-Jun-13 09:53 by Jobe Gibbon (MD)

## 2014-11-17 DIAGNOSIS — J329 Chronic sinusitis, unspecified: Secondary | ICD-10-CM | POA: Insufficient documentation

## 2014-12-11 ENCOUNTER — Other Ambulatory Visit: Payer: Self-pay | Admitting: *Deleted

## 2014-12-11 DIAGNOSIS — C8590 Non-Hodgkin lymphoma, unspecified, unspecified site: Secondary | ICD-10-CM

## 2014-12-16 ENCOUNTER — Inpatient Hospital Stay (HOSPITAL_BASED_OUTPATIENT_CLINIC_OR_DEPARTMENT_OTHER): Payer: BLUE CROSS/BLUE SHIELD | Admitting: Oncology

## 2014-12-16 ENCOUNTER — Inpatient Hospital Stay: Payer: BLUE CROSS/BLUE SHIELD

## 2014-12-16 ENCOUNTER — Encounter (INDEPENDENT_AMBULATORY_CARE_PROVIDER_SITE_OTHER): Payer: Self-pay

## 2014-12-16 ENCOUNTER — Other Ambulatory Visit: Payer: Self-pay | Admitting: Oncology

## 2014-12-16 ENCOUNTER — Inpatient Hospital Stay: Payer: BLUE CROSS/BLUE SHIELD | Attending: Oncology

## 2014-12-16 ENCOUNTER — Encounter: Payer: Self-pay | Admitting: Oncology

## 2014-12-16 VITALS — BP 117/78 | HR 88 | Temp 96.6°F | Wt 218.5 lb

## 2014-12-16 DIAGNOSIS — Z87891 Personal history of nicotine dependence: Secondary | ICD-10-CM

## 2014-12-16 DIAGNOSIS — C8590 Non-Hodgkin lymphoma, unspecified, unspecified site: Secondary | ICD-10-CM

## 2014-12-16 DIAGNOSIS — N189 Chronic kidney disease, unspecified: Secondary | ICD-10-CM | POA: Insufficient documentation

## 2014-12-16 DIAGNOSIS — Z79899 Other long term (current) drug therapy: Secondary | ICD-10-CM

## 2014-12-16 DIAGNOSIS — Z9221 Personal history of antineoplastic chemotherapy: Secondary | ICD-10-CM | POA: Insufficient documentation

## 2014-12-16 DIAGNOSIS — C8339 Diffuse large B-cell lymphoma, extranodal and solid organ sites: Secondary | ICD-10-CM | POA: Insufficient documentation

## 2014-12-16 DIAGNOSIS — E669 Obesity, unspecified: Secondary | ICD-10-CM | POA: Diagnosis not present

## 2014-12-16 DIAGNOSIS — C801 Malignant (primary) neoplasm, unspecified: Secondary | ICD-10-CM

## 2014-12-16 LAB — COMPREHENSIVE METABOLIC PANEL
ALT: 32 U/L (ref 14–54)
AST: 29 U/L (ref 15–41)
Albumin: 4 g/dL (ref 3.5–5.0)
Alkaline Phosphatase: 74 U/L (ref 38–126)
Anion gap: 4 — ABNORMAL LOW (ref 5–15)
BUN: 14 mg/dL (ref 6–20)
CO2: 26 mmol/L (ref 22–32)
Calcium: 8.5 mg/dL — ABNORMAL LOW (ref 8.9–10.3)
Chloride: 105 mmol/L (ref 101–111)
Creatinine, Ser: 0.91 mg/dL (ref 0.44–1.00)
GFR calc Af Amer: >60 mL/min (ref >60)
GFR calc non Af Amer: >60 mL/min (ref >60)
Glucose, Bld: 119 mg/dL — ABNORMAL HIGH (ref 65–99)
Potassium: 3.8 mmol/L (ref 3.5–5.1)
Sodium: 135 mmol/L (ref 135–145)
Total Bilirubin: 0.8 mg/dL (ref 0.3–1.2)
Total Protein: 6.9 g/dL (ref 6.5–8.1)

## 2014-12-16 LAB — CBC WITH DIFFERENTIAL/PLATELET
Basophils Absolute: 0 10*3/uL (ref 0–0.1)
Basophils Relative: 0 %
Eosinophils Absolute: 0.1 10*3/uL (ref 0–0.7)
Eosinophils Relative: 1 %
HCT: 37.9 % (ref 35.0–47.0)
Hemoglobin: 12.6 g/dL (ref 12.0–16.0)
Lymphocytes Relative: 14 %
Lymphs Abs: 1.3 10*3/uL (ref 1.0–3.6)
MCH: 29 pg (ref 26.0–34.0)
MCHC: 33.3 g/dL (ref 32.0–36.0)
MCV: 87 fL (ref 80.0–100.0)
Monocytes Absolute: 0.4 10*3/uL (ref 0.2–0.9)
Monocytes Relative: 5 %
Neutro Abs: 7.8 10*3/uL — ABNORMAL HIGH (ref 1.4–6.5)
Neutrophils Relative %: 80 %
Platelets: 188 10*3/uL (ref 150–440)
RBC: 4.36 MIL/uL (ref 3.80–5.20)
RDW: 13.1 % (ref 11.5–14.5)
WBC: 9.7 10*3/uL (ref 3.6–11.0)

## 2014-12-16 LAB — LACTATE DEHYDROGENASE: LDH: 181 U/L (ref 98–192)

## 2014-12-16 MED ORDER — HEPARIN SOD (PORK) LOCK FLUSH 100 UNIT/ML IV SOLN
500.0000 [IU] | Freq: Once | INTRAVENOUS | Status: AC
  Administered 2014-12-16: 500 [IU] via INTRAVENOUS

## 2014-12-16 MED ORDER — SODIUM CHLORIDE 0.9 % IJ SOLN
10.0000 mL | Freq: Once | INTRAMUSCULAR | Status: AC
  Administered 2014-12-16: 10 mL via INTRAVENOUS
  Filled 2014-12-16: qty 10

## 2014-12-16 MED ORDER — HEPARIN SOD (PORK) LOCK FLUSH 100 UNIT/ML IV SOLN
INTRAVENOUS | Status: AC
  Filled 2014-12-16: qty 5

## 2014-12-21 ENCOUNTER — Encounter: Payer: Self-pay | Admitting: Oncology

## 2014-12-21 NOTE — Progress Notes (Signed)
[place] @ [facility] [phone]  Fax:([phone] [place]: 1971/06/18  MR#: [id number]  PJK#:[id number]  Patient Care Team: No Pcp Per Patient as PCP - General (General Practice)  CHIEF COMPLAINT:  Chief Complaint  Patient presents with  . Follow-up    Oncology History   Chief Complaint/Diagnosis:   3. 44 year old lady was admitted in the hospital with hypercalcemia and abnormal CT scan of the abdomen  2. 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 Patient was started on RCHOP chemotherapy June of 2013 4. High-dose methotrexate in alternate cycles starting from July 29. 5.last dose of all chemotherapy with R. CHOP (April 29, 2012) patient received total 6 cycles of chemotherapy and tolerated treatment very well without any significant side effect 6.and underwent high dose chemotherapy and stem cell support High-dose chemotherapy with BEAM regimen.  (March 24: 014     DHL (diffuse histiocytic lymphoma)   10/28/2012 Initial Diagnosis DHL (diffuse histiocytic lymphoma)    No flowsheet data found.  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  Since last evaluation patient does not have any significant problem.  No chills.  No fever.  Patient is gaining weight.  There well-appearing moderate obesity REVIEW OF SYSTEMS:   GENERAL:  Feels good.  Active.  No fevers, sweats or weight loss. PERFORMANCE STATUS (ECOG):  0 HEENT:  No visual changes, runny nose, sore throat, mouth sores or tenderness. Lungs: No shortness of breath or cough.  No hemoptysis. Cardiac:  No chest pain, palpitations, orthopnea, or PND. GI:  No nausea, vomiting, diarrhea, constipation, melena or hematochezia. GU:  No urgency, frequency, dysuria, or  hematuria. Musculoskeletal:  No back pain.  No joint pain.  No muscle tenderness. Extremities:  No pain or swelling. Skin:  No rashes or skin changes. Neuro:  No headache, numbness or weakness, balance or coordination issues. Endocrine:  No diabetes, thyroid issues, hot flashes or night sweats. Psych:  No mood changes, depression or anxiety. Pain:  No focal pain. Review of systems:  All other systems reviewed and found to be negative.  As per HPI. Otherwise, a complete review of systems is negatve.  PAST MEDICAL HISTORY: Past Medical History  Diagnosis Date  . Chronic kidney disease   . Hypoalbuminemia   . Anemia   . Weight loss   . Fatigue   . Chronic sinusitis     PAST SURGICAL HISTORY: Past Surgical History  Procedure Laterality Date  . Rectal prolapse repair    . Abdominal hysterectomy    . Tubal ligation    . Bladder tacking      Significant History/PMH:   AKD:    fatigue:    chronic sinusitis:    prolapes rectal repair:    Hysterectomy - Partial:    Tubal Ligation:    bladder tacking:   Smoking History: Smoking History quit 13 years ago, smoked for 17 years.  PFSH: Comments: Patient is adopted  Social History: negative alcohol, negative tobacco  Comments: Used to smoke in the past  Additional Past Medical and Surgical History: No significant past medical history   FAMILY HISTORY She  was adopted      ADVANCED DIRECTIVES: Patient does not have any advanced healthcare directive. Information has been given.   HEALTH MAINTENANCE:  History  Substance Use Topics  . Smoking status: Former Smoker -- 1.00 packs/day    Types: Cigarettes    Quit date: 12/16/1998  . Smokeless tobacco: Never Used  . Alcohol Use: Yes     Comment: Occassional    :  Allergies  Allergen Reactions  . Penicillins Shortness Of Breath    Current Outpatient Prescriptions  Medication Sig Dispense Refill  . benzonatate (TESSALON) 100 MG capsule   0  .  chlorpheniramine-HYDROcodone (TUSSIONEX) 10-8 MG/5ML SUER   0  . citalopram (CELEXA) 10 MG tablet Take 10 mg by mouth daily.    . hydrOXYzine (ATARAX/VISTARIL) 25 MG tablet Take by mouth.    Marland Kitchen ibuprofen (ADVIL,MOTRIN) 600 MG tablet Take 600 mg by mouth.    . loperamide (IMODIUM) 2 MG capsule Take by mouth every 4 (four) hours as needed for diarrhea or loose stools.    Marland Kitchen omeprazole (PRILOSEC) 20 MG capsule Take 20 mg by mouth daily.    Marland Kitchen omeprazole (PRILOSEC) 20 MG capsule Take by mouth.    . oxyCODONE-acetaminophen (PERCOCET/ROXICET) 5-325 MG per tablet Take by mouth every 6 (six) hours as needed for severe pain.    Marland Kitchen PROAIR HFA 108 (90 BASE) MCG/ACT inhaler   0  . Turmeric 500 MG CAPS Take by mouth.    . citalopram (CELEXA) 10 MG tablet Take 10 mg by mouth.    . loperamide (IMODIUM) 2 MG capsule     . magnesium oxide (MAG-OX) 400 MG tablet Take 400 mg by mouth daily.    Marland Kitchen omeprazole (PRILOSEC) 20 MG capsule Take 20 mg by mouth.    . oxyCODONE-acetaminophen (PERCOCET/ROXICET) 5-325 MG per tablet Take by mouth.     No current facility-administered medications for this visit.    OBJECTIVE:  Filed Vitals:   46/25/16 0945  BP: 117/78  Pulse: 88  Temp: 96.6 F (35.9 C)     Body mass index is 33.11 kg/(m^2).    ECOG FS:0 - Asymptomatic  PHYSICAL EXAM: Goal status: Performance status is good.  Patient has not lost significant weight HEENT: No evidence of stomatitis. Sclera and conjunctivae :: No jaundice.   pale looking. Lungs: Air  entry equal on both sides.  No rhonchi.  No rales.  Cardiac: Heart sounds are normal.  No pericardial rub.  No murmur. Lymphatic system: Cervical, axillary, inguinal, lymph nodes not palpable GI: Abdomen is soft.  No ascites.  Liver spleen not palpable.  No tenderness.  Bowel sounds are within normal limit Lower extremity: No edema Neurological system: Higher functions, cranial nerves intact no evidence of peripheral neuropathy. Skin: No rash.  No  ecchymosis.Marland Kitchen   LAB RESULTS:  Appointment on 12/16/2014  Component Date Value Ref Range Status  . WBC 12/16/2014 9.7  3.6 - 11.0 K/uL Final   A-LINE DRAW  . RBC 12/16/2014 4.36  3.80 - 5.20 MIL/uL Final  . Hemoglobin 12/16/2014 12.6  12.0 - 16.0 g/dL Final  . HCT 12/16/2014 37.9  35.0 - 47.0 % Final  . MCV 12/16/2014 87.0  80.0 - 100.0 fL Final  . MCH 12/16/2014 29.0  26.0 - 34.0 pg Final  . MCHC 12/16/2014 33.3  32.0 - 36.0 g/dL Final  . RDW 12/16/2014 13.1  11.5 - 14.5 % Final  . Platelets 12/16/2014 188  150 - 440 K/uL Final  . Neutrophils Relative % 12/16/2014 80   Final  . Neutro Abs 12/16/2014 7.8* 1.4 - 6.5 K/uL Final  . Lymphocytes Relative 12/16/2014 14  Final  . Lymphs Abs 12/16/2014 1.3  1.0 - 3.6 K/uL Final  . Monocytes Relative 12/16/2014 5   Final  . Monocytes Absolute 12/16/2014 0.4  0.2 - 0.9 K/uL Final  . Eosinophils Relative 12/16/2014 1   Final  . Eosinophils Absolute 12/16/2014 0.1  0 - 0.7 K/uL Final  . Basophils Relative 12/16/2014 0   Final  . Basophils Absolute 12/16/2014 0.0  0 - 0.1 K/uL Final  . Sodium 12/16/2014 135  135 - 145 mmol/L Final  . Potassium 12/16/2014 3.8  3.5 - 5.1 mmol/L Final  . Chloride 12/16/2014 105  101 - 111 mmol/L Final  . CO2 12/16/2014 26  22 - 32 mmol/L Final  . Glucose, Bld 12/16/2014 119* 65 - 99 mg/dL Final  . BUN 12/16/2014 14  6 - 20 mg/dL Final  . Creatinine, Ser 12/16/2014 0.91  0.44 - 1.00 mg/dL Final  . Calcium 12/16/2014 8.5* 8.9 - 10.3 mg/dL Final  . Total Protein 12/16/2014 6.9  6.5 - 8.1 g/dL Final  . Albumin 12/16/2014 4.0  3.5 - 5.0 g/dL Final  . AST 12/16/2014 29  15 - 41 U/L Final  . ALT 12/16/2014 32  14 - 54 U/L Final  . Alkaline Phosphatase 12/16/2014 74  38 - 126 U/L Final  . Total Bilirubin 12/16/2014 0.8  0.3 - 1.2 mg/dL Final  . GFR calc non Af Amer 12/16/2014 >60  >60 mL/min Final  . GFR calc Af Amer 12/16/2014 >60  >60 mL/min Final   Comment: (NOTE) The eGFR has been calculated using the CKD EPI  equation. This calculation has not been validated in all clinical situations. eGFR's persistently <60 mL/min signify possible Chronic Kidney Disease.   . Anion gap 12/16/2014 4* 5 - 15 Final  . LDH 12/16/2014 181  98 - 192 U/L Final    Lab Results  Component Value Date   LABCA2 114.0* 01/03/2012   Lab Results  Component Value Date   CA199 14 01/04/2012   Lab Results  Component Value Date   CEA 1.1 01/03/2012   No results found for: PSA Lab Results  Component Value Date   CA125 409.3* 01/03/2012     STUDIES: No results found.  ASSESSMENT: 44 year old lady with stage IV lymphoma.  Diffuse B large cell status post chemotherapy therapy with consolidation high-dose chemotherapy   MEDICAL DECISION MAKING:  I will lab data has been reviewed.  On clinical examination there is no evidence of recurrent disease. Repeat PET scan has been scheduled in September and then reevaluate patient  Patient expressed understanding and was in agreement with this plan. She also understands that She can call clinic at any time with any questions, concerns, or complaints.     Forest Gleason, MD   12/21/2014 12:21 PM

## 2015-01-09 ENCOUNTER — Emergency Department: Payer: BLUE CROSS/BLUE SHIELD

## 2015-01-09 ENCOUNTER — Encounter: Payer: Self-pay | Admitting: Emergency Medicine

## 2015-01-09 ENCOUNTER — Emergency Department
Admission: EM | Admit: 2015-01-09 | Discharge: 2015-01-10 | Disposition: A | Discharge status: Home / Self Care | Payer: BLUE CROSS/BLUE SHIELD | Attending: Emergency Medicine | Admitting: Emergency Medicine

## 2015-01-09 DIAGNOSIS — N200 Calculus of kidney: Secondary | ICD-10-CM | POA: Insufficient documentation

## 2015-01-09 DIAGNOSIS — R109 Unspecified abdominal pain: Secondary | ICD-10-CM

## 2015-01-09 DIAGNOSIS — N189 Chronic kidney disease, unspecified: Secondary | ICD-10-CM | POA: Insufficient documentation

## 2015-01-09 DIAGNOSIS — Z87891 Personal history of nicotine dependence: Secondary | ICD-10-CM | POA: Insufficient documentation

## 2015-01-09 DIAGNOSIS — Z7951 Long term (current) use of inhaled steroids: Secondary | ICD-10-CM | POA: Diagnosis not present

## 2015-01-09 DIAGNOSIS — Z79891 Long term (current) use of opiate analgesic: Secondary | ICD-10-CM | POA: Diagnosis not present

## 2015-01-09 DIAGNOSIS — Z880 Allergy status to penicillin: Secondary | ICD-10-CM | POA: Insufficient documentation

## 2015-01-09 DIAGNOSIS — Z79899 Other long term (current) drug therapy: Secondary | ICD-10-CM | POA: Diagnosis not present

## 2015-01-09 LAB — COMPREHENSIVE METABOLIC PANEL
ALT: 33 U/L (ref 14–54)
AST: 36 U/L (ref 15–41)
Albumin: 4.6 g/dL (ref 3.5–5.0)
Alkaline Phosphatase: 75 U/L (ref 38–126)
Anion gap: 4 — ABNORMAL LOW (ref 5–15)
BUN: 12 mg/dL (ref 6–20)
CO2: 29 mmol/L (ref 22–32)
Calcium: 8.9 mg/dL (ref 8.9–10.3)
Chloride: 106 mmol/L (ref 101–111)
Creatinine, Ser: 1.25 mg/dL — ABNORMAL HIGH (ref 0.44–1.00)
GFR calc Af Amer: 59 mL/min — ABNORMAL LOW (ref >60)
GFR calc non Af Amer: 51 mL/min — ABNORMAL LOW (ref >60)
Glucose, Bld: 115 mg/dL — ABNORMAL HIGH (ref 65–99)
Potassium: 3.8 mmol/L (ref 3.5–5.1)
Sodium: 139 mmol/L (ref 135–145)
Total Bilirubin: 0.6 mg/dL (ref 0.3–1.2)
Total Protein: 7.1 g/dL (ref 6.5–8.1)

## 2015-01-09 LAB — LIPASE, BLOOD: Lipase: 42 U/L (ref 22–51)

## 2015-01-09 MED ORDER — FENTANYL CITRATE (PF) 100 MCG/2ML IJ SOLN
50.0000 ug | Freq: Once | INTRAMUSCULAR | Status: AC
  Administered 2015-01-09: 50 ug via INTRAVENOUS

## 2015-01-09 MED ORDER — SODIUM CHLORIDE 0.9 % IV SOLN
1000.0000 mL | Freq: Once | INTRAVENOUS | Status: AC
  Administered 2015-01-09: 1000 mL via INTRAVENOUS

## 2015-01-09 MED ORDER — ONDANSETRON HCL 4 MG/2ML IJ SOLN
4.0000 mg | Freq: Once | INTRAMUSCULAR | Status: AC
  Administered 2015-01-09: 4 mg via INTRAVENOUS

## 2015-01-09 MED ORDER — ONDANSETRON HCL 4 MG/2ML IJ SOLN
INTRAMUSCULAR | Status: AC
  Administered 2015-01-09: 4 mg via INTRAVENOUS
  Filled 2015-01-09: qty 2

## 2015-01-09 MED ORDER — OXYCODONE-ACETAMINOPHEN 5-325 MG PO TABS
1.0000 | ORAL_TABLET | ORAL | Status: AC
  Administered 2015-01-10: 1 via ORAL

## 2015-01-09 MED ORDER — KETOROLAC TROMETHAMINE 30 MG/ML IJ SOLN: 30.0000 mg | Freq: Once | INTRAMUSCULAR | Status: AC

## 2015-01-09 MED ORDER — KETOROLAC TROMETHAMINE 60 MG/2ML IM SOLN
INTRAMUSCULAR | Status: AC
  Administered 2015-01-09: 30 mg
  Filled 2015-01-09: qty 2

## 2015-01-09 MED ORDER — FENTANYL CITRATE (PF) 100 MCG/2ML IJ SOLN
INTRAMUSCULAR | Status: AC
  Administered 2015-01-09: 50 ug via INTRAVENOUS
  Filled 2015-01-09: qty 2

## 2015-01-09 MED ORDER — ONDANSETRON 4 MG PO TBDP: 4.0000 mg | ORAL_TABLET | Freq: Four times a day (QID) | ORAL | Status: DC | PRN

## 2015-01-09 MED ORDER — OXYCODONE-ACETAMINOPHEN 5-325 MG PO TABS: 1.0000 | ORAL_TABLET | Freq: Four times a day (QID) | ORAL | Status: DC | PRN

## 2015-01-09 NOTE — ED Notes (Signed)
Pt up to bathroom, steady gait. Family at bedside.

## 2015-01-09 NOTE — ED Provider Notes (Addendum)
[facility] Emergency Department Provider Note  ____________________________________________  Time seen: Approximately 10:44 PM  I have reviewed the triage vital signs and the nursing notes.   HISTORY  Chief Complaint Flank Pain    HPI Bethany Kelly is a 44 y.o. female who is been having pain with urination for about 2 weeks. She was seen by her doctor who put her on a week of Cipro. She was, however later told that her urine test was normal. Then, today she developed severe right flank pain which is located around the right back. There is sharp, and severe. This caused her to vomit because of pain.  No fever or chills. No chest pain or shortness of breath. She has had a hysterectomy. She has had a previous history of cancer and bone marrow transplant, cancer in remission.  Pain sudden, sharp, severe and right flank. Associated with slightly darker urine.  Past Medical History  Diagnosis Date  . Chronic kidney disease   . Hypoalbuminemia   . Anemia   . Weight loss   . Fatigue   . Chronic sinusitis     Patient Active Problem List   Diagnosis Date Noted  . Chronic sinusitis   . Acid reflux 10/29/2012  . 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 BP (high blood pressure) 10/28/2012  . Bone marrow transplant complication 62/83/6629    Past Surgical History  Procedure Laterality Date  . Rectal prolapse repair    . Abdominal hysterectomy    . Tubal ligation    . Bladder tacking    . 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 citalopram (CELEXA) 10 MG tablet   Oral   Take 10 mg by mouth.         . Cranberry-Vitamin C-Probiotic (AZO CRANBERRY PO)   Oral   Take 1 capsule by mouth every 4 (four) hours as needed (urinary urgency).         Marland Kitchen ibuprofen (ADVIL,MOTRIN) 600 MG tablet   Oral   Take 600 mg by mouth every 6 (six) hours as needed for fever, headache or mild pain.           Marland Kitchen loperamide (IMODIUM) 2 MG capsule   Oral   Take 2 mg by mouth every 4 (four) hours as needed for diarrhea or loose stools.          Marland Kitchen omeprazole (PRILOSEC) 20 MG capsule   Oral   Take 20 mg by mouth.         Marland Kitchen PROAIR HFA 108 (90 BASE) MCG/ACT inhaler   Inhalation   Inhale 2 puffs into the lungs every 4 (four) hours as needed for wheezing or shortness of breath.       0     Dispense as written.   . Turmeric 500 MG CAPS   Oral   Take 1 capsule by mouth daily at 12 noon.          . benzonatate (TESSALON) 100 MG capsule            0   . chlorpheniramine-HYDROcodone (TUSSIONEX) 10-8 MG/5ML SUER            0   . citalopram (CELEXA) 10 MG tablet   Oral   Take 10 mg by mouth daily.         . hydrOXYzine (ATARAX/VISTARIL) 25 MG tablet   Oral  Take 25 mg by mouth.          . loperamide (IMODIUM) 2 MG capsule               . magnesium oxide (MAG-OX) 400 MG tablet   Oral   Take 400 mg by mouth daily.         Marland Kitchen omeprazole (PRILOSEC) 20 MG capsule   Oral   Take 20 mg by mouth daily.         Marland Kitchen omeprazole (PRILOSEC) 20 MG capsule   Oral   Take by mouth.         . oxyCODONE-acetaminophen (PERCOCET/ROXICET) 5-325 MG per tablet   Oral   Take by mouth every 6 (six) hours as needed for severe pain.         Marland Kitchen oxyCODONE-acetaminophen (PERCOCET/ROXICET) 5-325 MG per tablet   Oral   Take by mouth.           Allergies Penicillins  No family history on file.  Social History History  Substance Use Topics  . Smoking status: Former Smoker -- 1.00 packs/day    Types: Cigarettes    Quit date: 12/16/1998  . Smokeless tobacco: Never Used  . Alcohol Use: Yes     Comment: Occassional    Review of Systems Constitutional: No fever/chills Eyes: No visual changes. ENT: No sore throat. Cardiovascular: Denies chest pain. Respiratory: Denies shortness of breath. Gastrointestinal: See history of present illness  No diarrhea.  No  constipation. Genitourinary: No abnormal odor, does have aching and burning with urination. No vaginal discharge or bleeding. Musculoskeletal: Negative for back pain. Skin: Negative for rash. Neurological: Negative for headaches, focal weakness or numbness.  10-point ROS otherwise negative.  ____________________________________________   PHYSICAL EXAM:  VITAL SIGNS: ED Triage Vitals  Enc Vitals Group     BP 01/09/15 2038 172/96 mmHg     Pulse Rate 01/09/15 2038 96     Resp 01/09/15 2038 20     Temp 01/09/15 2038 98 F (36.7 C)     Temp Source 01/09/15 2038 Oral     SpO2 01/09/15 2038 100 %     Weight 01/09/15 2038 210 lb (95.255 kg)     Height 01/09/15 2038 5\' 8"  (1.727 m)     Head Cir --      Peak Flow --      Pain Score 01/09/15 2039 8     Pain Loc --      Pain Edu? --      Excl. in [place]? --     Constitutional: Alert and oriented. Well appearing and in no acute distress. Eyes: Conjunctivae are normal. PERRL. EOMI. Head: Atraumatic. Nose: No congestion/rhinnorhea. Mouth/Throat: Mucous membranes are moist.  Oropharynx non-erythematous. Neck: No stridor.   Cardiovascular: Normal rate, regular rhythm. Grossly normal heart sounds.  Good peripheral circulation. Respiratory: Normal respiratory effort.  No retractions. Lungs CTAB. Gastrointestinal: Soft and nontender. No distention. No abdominal bruits. Mild R CVAT. Neg murphy. No pain at [place]. Musculoskeletal: No lower extremity tenderness nor edema.  No joint effusions. Neurologic:  Normal speech and language. No gross focal neurologic deficits are appreciated. Speech is normal. No gait instability. Skin:  Skin is warm, dry and intact. No rash noted. Psychiatric: Mood and affect are normal. Speech and behavior are normal.  ____________________________________________   LABS (all labs ordered are listed, but only abnormal results are displayed)  Labs Reviewed  COMPREHENSIVE METABOLIC PANEL - Abnormal; Notable for the  following:  Glucose, Bld 115 (*)    Creatinine, Ser 1.25 (*)    GFR calc non Af Amer 51 (*)    GFR calc Af Amer 59 (*)    Anion gap 4 (*)    All other components within normal limits  LIPASE, BLOOD  URINALYSIS COMPLETEWITH MICROSCOPIC ([facility] ONLY)   ____________________________________________   ____________________________________________  RADIOLOGY  CT stone pending at time of sign out ____________________________________________   PROCEDURES  Procedure(s) performed: None  Critical Care performed: No  ____________________________________________   INITIAL IMPRESSION / ASSESSMENT AND PLAN / ED COURSE  Pertinent labs & imaging results that were available during my care of the patient were reviewed by me and considered in my medical decision making (see chart for details).  Approximately 2 weeks of dysuria, now with sudden onset right posterior flank pain. Patient's history and symptoms seem to suggest genitourinary etiology, possibly kidney stone. Alternative considerations would include cholelithiasis, ovarian cyst, etc. Seems highly unlikely that this would represent acute infectious etiology such as appendicitis or cholecystitis. She has a normal white count, afebrile, in addition the patient does not have right lower pelvic pain which would argue against acute pelvic process.  Plan of care is to await urinalysis and obtain CT stone protocol based on symptomatology. Patient is presently pain free, with very reassuring exam. Plan of care and follow-up on urinalysis and CT signed out to Dr. Dineen Kid at this time. 11 PM. Should CT demonstrate adnexal abnormality, then I would recommend transvaginal ultrasound to further evaluate for ovarian process. ____________________________________________   FINAL CLINICAL IMPRESSION(S) / ED DIAGNOSES  Final diagnoses:  Acute right flank pain    ----------------------------------------- 11:51 PM on  01/09/2015 -----------------------------------------  Patient fully awake and alert. Resting comfortably does still have some achy pain over the right flank. I discussed with the patient and the only active medicine she has is Celexa. I will give her a prescription for Percocet for breakthrough pain. I discussed with her and her husband note driving tonight or while taking Percocet. In addition, I reviewed safety instructions and safe use never to take more than 1 tablet every 6 hours. Patient is agreeable.  Delman Kitten, MD 01/09/15 3016  Delman Kitten, MD 01/09/15 2352

## 2015-01-09 NOTE — ED Notes (Signed)
Pt. States having rt. Sided flank pain.  Pt. States pain to the rt. Lower back.  Pt. States dysuria.  Pt. States finishing up on antibiotics yesterday,  For possible urine infection.  Pt. States results of urinalysis did not show UTI on visit to primary doctor.    Pt. Denies hx of kidney stones. Pt. Able to ambulate but restless due to flank pain.

## 2015-01-09 NOTE — Discharge Instructions (Signed)

## 2015-01-10 ENCOUNTER — Encounter: Payer: Self-pay | Admitting: Emergency Medicine

## 2015-01-10 LAB — URINALYSIS COMPLETE WITH MICROSCOPIC (ARMC ONLY): Specific Gravity, Urine: 1.024 (ref 1.005–1.030)

## 2015-01-10 LAB — CBC
HCT: 36.4 % (ref 35.0–47.0)
Hemoglobin: 11.9 g/dL — ABNORMAL LOW (ref 12.0–16.0)
MCH: 28.9 pg (ref 26.0–34.0)
MCHC: 32.8 g/dL (ref 32.0–36.0)
MCV: 88.2 fL (ref 80.0–100.0)
Platelets: 178 10*3/uL (ref 150–440)
RBC: 4.13 MIL/uL (ref 3.80–5.20)
RDW: 13.7 % (ref 11.5–14.5)
WBC: 10.3 10*3/uL (ref 3.6–11.0)

## 2015-01-10 MED ORDER — TAMSULOSIN HCL 0.4 MG PO CAPS
0.4000 mg | ORAL_CAPSULE | Freq: Once | ORAL | Status: AC
  Administered 2015-01-10: 0.4 mg via ORAL

## 2015-01-10 MED ORDER — TAMSULOSIN HCL 0.4 MG PO CAPS
ORAL_CAPSULE | ORAL | Status: AC
  Administered 2015-01-10: 0.4 mg via ORAL
  Filled 2015-01-10: qty 1

## 2015-01-10 MED ORDER — TAMSULOSIN HCL 0.4 MG PO CAPS: 0.4000 mg | ORAL_CAPSULE | Freq: Every day | ORAL | Status: DC

## 2015-01-10 NOTE — ED Notes (Signed)
Pt alert and in NAD at time of d/c to husband. 

## 2015-01-10 NOTE — ED Provider Notes (Signed)
  Physical Exam  BP 147/92 mmHg  Pulse 76  Temp(Src) 98 F (36.7 C) (Oral)  Resp 20  Ht 5\' 8"  (1.727 m)  Wt 210 lb (95.255 kg)  BMI 31.94 kg/m2  SpO2 98%  Physical Exam Patient resting comfortable now with pain improved. ED Course  Procedures  MDM UA confounded because patient taking Azo. Says went to urgent care 1 week ago and had a urine culture which was negative. Despite this still continued with a week's worth of Cipro. Which she has completed. Symptoms likely related to kidney stones. We'll discharge with Flomax and pain and nausea control. Patient to follow up with urology. Patient and husband counseled return if any fever, chills or worsening symptoms. However at this time the labs and vital signs are reassuring.       Orbie Pyo, MD 01/10/15 [place]

## 2015-02-05 ENCOUNTER — Inpatient Hospital Stay: Payer: BLUE CROSS/BLUE SHIELD

## 2015-02-08 ENCOUNTER — Inpatient Hospital Stay: Payer: BLUE CROSS/BLUE SHIELD | Attending: Oncology

## 2015-02-08 DIAGNOSIS — C8590 Non-Hodgkin lymphoma, unspecified, unspecified site: Secondary | ICD-10-CM | POA: Diagnosis not present

## 2015-02-08 DIAGNOSIS — Z452 Encounter for adjustment and management of vascular access device: Secondary | ICD-10-CM | POA: Diagnosis not present

## 2015-02-08 DIAGNOSIS — C801 Malignant (primary) neoplasm, unspecified: Secondary | ICD-10-CM

## 2015-02-08 MED ORDER — SODIUM CHLORIDE 0.9 % IJ SOLN
10.0000 mL | INTRAMUSCULAR | Status: DC | PRN
  Administered 2015-02-08: 10 mL via INTRAVENOUS
  Filled 2015-02-08: qty 10

## 2015-02-08 MED ORDER — HEPARIN SOD (PORK) LOCK FLUSH 100 UNIT/ML IV SOLN
500.0000 [IU] | Freq: Once | INTRAVENOUS | Status: AC
  Administered 2015-02-08: 500 [IU] via INTRAVENOUS

## 2015-02-08 MED ORDER — HEPARIN SOD (PORK) LOCK FLUSH 100 UNIT/ML IV SOLN
INTRAVENOUS | Status: AC
  Filled 2015-02-08: qty 5

## 2015-02-19 ENCOUNTER — Other Ambulatory Visit: Payer: Self-pay | Admitting: *Deleted

## 2015-02-19 DIAGNOSIS — C8590 Non-Hodgkin lymphoma, unspecified, unspecified site: Secondary | ICD-10-CM

## 2015-03-09 ENCOUNTER — Ambulatory Visit: Admission: RE | Admit: 2015-03-09 | Payer: BLUE CROSS/BLUE SHIELD | Source: Ambulatory Visit

## 2015-03-11 ENCOUNTER — Other Ambulatory Visit: Payer: BLUE CROSS/BLUE SHIELD

## 2015-03-11 ENCOUNTER — Ambulatory Visit: Payer: BLUE CROSS/BLUE SHIELD | Admitting: Oncology

## 2015-03-12 ENCOUNTER — Ambulatory Visit
Admission: RE | Admit: 2015-03-12 | Discharge: 2015-03-12 | Disposition: A | Discharge status: Home / Self Care | Payer: BLUE CROSS/BLUE SHIELD | Source: Ambulatory Visit | Attending: Oncology | Admitting: Oncology

## 2015-03-12 DIAGNOSIS — C8590 Non-Hodgkin lymphoma, unspecified, unspecified site: Secondary | ICD-10-CM

## 2015-03-12 LAB — GLUCOSE, CAPILLARY: Glucose-Capillary: 91 mg/dL (ref 65–99)

## 2015-03-12 MED ORDER — FLUDEOXYGLUCOSE F - 18 (FDG) INJECTION
12.9500 | Freq: Once | INTRAVENOUS | Status: DC | PRN
  Administered 2015-03-12: 12.95 via INTRAVENOUS
  Filled 2015-03-12: qty 12.95

## 2015-03-18 ENCOUNTER — Inpatient Hospital Stay: Payer: BLUE CROSS/BLUE SHIELD | Attending: Oncology

## 2015-03-18 ENCOUNTER — Inpatient Hospital Stay: Payer: BLUE CROSS/BLUE SHIELD

## 2015-03-18 ENCOUNTER — Inpatient Hospital Stay (HOSPITAL_BASED_OUTPATIENT_CLINIC_OR_DEPARTMENT_OTHER): Payer: BLUE CROSS/BLUE SHIELD | Admitting: Oncology

## 2015-03-18 ENCOUNTER — Encounter: Payer: Self-pay | Admitting: Oncology

## 2015-03-18 VITALS — BP 121/83 | HR 80 | Temp 97.1°F | Wt 215.2 lb

## 2015-03-18 DIAGNOSIS — R413 Other amnesia: Secondary | ICD-10-CM

## 2015-03-18 DIAGNOSIS — C8338 Diffuse large B-cell lymphoma, lymph nodes of multiple sites: Secondary | ICD-10-CM | POA: Insufficient documentation

## 2015-03-18 DIAGNOSIS — Z79899 Other long term (current) drug therapy: Secondary | ICD-10-CM | POA: Insufficient documentation

## 2015-03-18 DIAGNOSIS — Z87891 Personal history of nicotine dependence: Secondary | ICD-10-CM | POA: Diagnosis not present

## 2015-03-18 DIAGNOSIS — N189 Chronic kidney disease, unspecified: Secondary | ICD-10-CM | POA: Diagnosis not present

## 2015-03-18 DIAGNOSIS — C8590 Non-Hodgkin lymphoma, unspecified, unspecified site: Secondary | ICD-10-CM

## 2015-03-18 DIAGNOSIS — Z9221 Personal history of antineoplastic chemotherapy: Secondary | ICD-10-CM | POA: Diagnosis not present

## 2015-03-18 DIAGNOSIS — R5383 Other fatigue: Secondary | ICD-10-CM | POA: Insufficient documentation

## 2015-03-18 DIAGNOSIS — C8330 Diffuse large B-cell lymphoma, unspecified site: Secondary | ICD-10-CM

## 2015-03-18 LAB — COMPREHENSIVE METABOLIC PANEL
ALT: 24 U/L (ref 14–54)
AST: 23 U/L (ref 15–41)
Albumin: 3.9 g/dL (ref 3.5–5.0)
Alkaline Phosphatase: 65 U/L (ref 38–126)
Anion gap: 3 — ABNORMAL LOW (ref 5–15)
BUN: 13 mg/dL (ref 6–20)
CO2: 26 mmol/L (ref 22–32)
Calcium: 8.6 mg/dL — ABNORMAL LOW (ref 8.9–10.3)
Chloride: 108 mmol/L (ref 101–111)
Creatinine, Ser: 0.99 mg/dL (ref 0.44–1.00)
GFR calc Af Amer: >60 mL/min (ref >60)
GFR calc non Af Amer: >60 mL/min (ref >60)
Glucose, Bld: 107 mg/dL — ABNORMAL HIGH (ref 65–99)
Potassium: 3.9 mmol/L (ref 3.5–5.1)
Sodium: 137 mmol/L (ref 135–145)
Total Bilirubin: 0.5 mg/dL (ref 0.3–1.2)
Total Protein: 7.1 g/dL (ref 6.5–8.1)

## 2015-03-18 LAB — CBC WITH DIFFERENTIAL/PLATELET
Basophils Absolute: 0 10*3/uL (ref 0–0.1)
Basophils Relative: 1 %
Eosinophils Absolute: 0.1 10*3/uL (ref 0–0.7)
Eosinophils Relative: 1 %
HCT: 37.9 % (ref 35.0–47.0)
Hemoglobin: 12.8 g/dL (ref 12.0–16.0)
Lymphocytes Relative: 31 %
Lymphs Abs: 1.9 10*3/uL (ref 1.0–3.6)
MCH: 29.2 pg (ref 26.0–34.0)
MCHC: 33.8 g/dL (ref 32.0–36.0)
MCV: 86.6 fL (ref 80.0–100.0)
Monocytes Absolute: 0.4 10*3/uL (ref 0.2–0.9)
Monocytes Relative: 7 %
Neutro Abs: 3.7 10*3/uL (ref 1.4–6.5)
Neutrophils Relative %: 60 %
Platelets: 208 10*3/uL (ref 150–440)
RBC: 4.38 MIL/uL (ref 3.80–5.20)
RDW: 13.2 % (ref 11.5–14.5)
WBC: 6.1 10*3/uL (ref 3.6–11.0)

## 2015-03-18 LAB — LACTATE DEHYDROGENASE: LDH: 151 U/L (ref 98–192)

## 2015-03-18 MED ORDER — SODIUM CHLORIDE 0.9 % IJ SOLN
10.0000 mL | Freq: Once | INTRAMUSCULAR | Status: AC
  Administered 2015-03-18: 10 mL via INTRAVENOUS
  Filled 2015-03-18: qty 10

## 2015-03-18 MED ORDER — HEPARIN SOD (PORK) LOCK FLUSH 100 UNIT/ML IV SOLN
500.0000 [IU] | Freq: Once | INTRAVENOUS | Status: AC
  Administered 2015-03-18: 500 [IU] via INTRAVENOUS

## 2015-03-18 NOTE — Progress Notes (Signed)
[place] @ [facility] Telephone:([phone]) [phone]  Fax:([phone] [place]: [birth year]  MR#: [id number]  XVQ#:[id number]  Patient Care Team: No Pcp Per Patient as PCP - General (General Practice)  CHIEF COMPLAINT:  Chief Complaint  Patient presents with  . Follow-up    Oncology History   Chief Complaint/Diagnosis:   70. 44 year old lady was admitted in the hospital with hypercalcemia and abnormal CT scan of the abdomen  2. 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 Patient was started on RCHOP chemotherapy June of 2013 4. High-dose methotrexate in alternate cycles starting from July 29. 5.last dose of all chemotherapy with R. CHOP (April 29, 2012) patient received total 6 cycles of chemotherapy and tolerated treatment very well without any significant side effect 6.and underwent high dose chemotherapy and stem cell support High-dose chemotherapy with BEAM regimen.  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  Since last evaluation patient does not have any significant problem.  No chills.  No fever.  Patient is gaining weight.  There well-appearing moderate obesity  March 18, 2015 Patient is here for further follow-up regarding diffuse large cell lymphoma stage IV status post chemotherapy and consultation therapy with high-dose chemotherapy and stem cell support. Patient had a repeat PET scan done.  Patient continues to have problem with some weakness as well as complaints of memory loss.  No headache or dizziness. No chills or  fever REVIEW OF SYSTEMS:   GENERAL:  Feels good.  Active.  No fevers, sweats or weight loss. PERFORMANCE STATUS (ECOG):  0 HEENT:  No visual changes, runny nose, sore throat, mouth sores or tenderness. Lungs: No shortness of breath or cough.  No hemoptysis. Cardiac:  No chest pain, palpitations, orthopnea, or PND. GI:  No nausea, vomiting, diarrhea, constipation, melena or hematochezia. GU:  No urgency, frequency, dysuria, or hematuria. Musculoskeletal:  No back pain.  No joint pain.  No muscle tenderness. Extremities:  No pain or swelling. Skin:  No rashes or skin changes. Neuro:  No headache, numbness or weakness, balance or coordination issues. Endocrine:  No diabetes, thyroid issues, hot flashes or night sweats. Psych:  No mood changes, depression or anxiety. Pain:  No focal pain. Review of systems:  All other systems reviewed and found to be negative.  As per HPI. Otherwise, a complete review of systems is negatve.  PAST MEDICAL HISTORY: Past Medical History  Diagnosis Date  . Chronic kidney disease   . Hypoalbuminemia   . Anemia   . Weight loss   . Fatigue   . Chronic sinusitis   . Cancer     PAST SURGICAL HISTORY: Past Surgical History  Procedure Laterality Date  . Rectal prolapse repair    . Abdominal hysterectomy    . Tubal ligation    . Bladder tacking    . 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  PFSH: Comments: Patient is adopted  Social History: negative alcohol, negative tobacco  Comments: Used to smoke in the past  Additional Past Medical and Surgical History: No significant past medical history   FAMILY HISTORY She  was adopted      ADVANCED DIRECTIVES: Patient does not have any advanced healthcare  directive. Information has been given.   HEALTH MAINTENANCE: Social History  Substance Use Topics  . Smoking status: Former Smoker -- 1.00 packs/day    Types: Cigarettes    Quit date: 12/16/1998  . Smokeless tobacco: Never Used  . Alcohol Use: Yes     Comment: Occassional    :  Allergies  Allergen Reactions  . Penicillins Shortness Of Breath    Current Outpatient Prescriptions  Medication Sig Dispense Refill  . citalopram (CELEXA) 10 MG tablet Take 10 mg by mouth daily.    . ondansetron (ZOFRAN ODT) 4 MG disintegrating tablet Take 1 tablet (4 mg total) by mouth every 6 (six) hours as needed for nausea or vomiting. 20 tablet 0  . oxyCODONE-acetaminophen (ROXICET) 5-325 MG per tablet Take 1 tablet by mouth every 6 (six) hours as needed for severe pain. 20 tablet 0  . tamsulosin (FLOMAX) 0.4 MG CAPS capsule Take 1 capsule (0.4 mg total) by mouth daily. 30 capsule 0  . citalopram (CELEXA) 10 MG tablet Take 10 mg by mouth.    . [DISCONTINUED] omeprazole (PRILOSEC) 20 MG capsule Take 20 mg by mouth daily.    . [DISCONTINUED] PROAIR HFA 108 (90 BASE) MCG/ACT inhaler Inhale 2 puffs into the lungs every 4 (four) hours as needed for wheezing or shortness of breath.   0   No current facility-administered medications for this visit.    OBJECTIVE:  Filed Vitals:   03/18/15 0904  BP: 121/83  Pulse: 80  Temp: 97.1 F (36.2 C)     Body mass index is 32.72 kg/(m^2).    ECOG FS:0 - Asymptomatic  PHYSICAL EXAM: Goal status: Performance status is good.  Patient has not lost significant weight HEENT: No evidence of stomatitis. Sclera and conjunctivae :: No jaundice.   pale looking. Lungs: Air  entry equal on both sides.  No rhonchi.  No rales.  Cardiac: Heart sounds are normal.  No pericardial rub.  No murmur. Lymphatic system: Cervical, axillary, inguinal, lymph nodes not palpable GI: Abdomen is soft.  No ascites.  Liver spleen not palpable.  No tenderness.  Bowel sounds are within  normal limit Lower extremity: No edema Neurological system: Higher functions, cranial nerves intact no evidence of peripheral neuropathy. Skin: No rash.  No ecchymosis.Marland Kitchen   LAB RESULTS:  Infusion on 03/18/2015  Component Date Value Ref Range Status  . WBC 03/18/2015 6.1  3.6 - 11.0 K/uL Final   A-LINE DRAW  . RBC 03/18/2015 4.38  3.80 - 5.20 MIL/uL Final  . Hemoglobin 03/18/2015 12.8  12.0 - 16.0 g/dL Final  . HCT 03/18/2015 37.9  35.0 - 47.0 % Final  . MCV 03/18/2015 86.6  80.0 - 100.0 fL Final  . MCH 03/18/2015 29.2  26.0 - 34.0 pg Final  . MCHC 03/18/2015 33.8  32.0 - 36.0 g/dL Final  . RDW 03/18/2015 13.2  11.5 - 14.5 % Final  . Platelets 03/18/2015 208  150 - 440 K/uL Final  . Neutrophils Relative % 03/18/2015 60   Final  . Neutro Abs 03/18/2015 3.7  1.4 - 6.5 K/uL Final  . Lymphocytes Relative 03/18/2015 31   Final  . Lymphs Abs 03/18/2015 1.9  1.0 - 3.6 K/uL Final  . Monocytes Relative 03/18/2015 7   Final  . Monocytes Absolute 03/18/2015 0.4  0.2 - 0.9 K/uL Final  . Eosinophils Relative 03/18/2015 1   Final  . Eosinophils Absolute 03/18/2015 0.1  0 - 0.7 K/uL Final  . Basophils Relative 03/18/2015 1   Final  . Basophils Absolute 03/18/2015 0.0  0 - 0.1 K/uL Final  . Sodium 03/18/2015 137  135 - 145 mmol/L Final  . Potassium 03/18/2015 3.9  3.5 - 5.1 mmol/L Final  . Chloride 03/18/2015 108  101 - 111 mmol/L Final  . CO2 03/18/2015 26  22 - 32 mmol/L Final  . Glucose, Bld 03/18/2015 107* 65 - 99 mg/dL Final  . BUN 03/18/2015 13  6 - 20 mg/dL Final  . Creatinine, Ser 03/18/2015 0.99  0.44 - 1.00 mg/dL Final  . Calcium 03/18/2015 8.6* 8.9 - 10.3 mg/dL Final  . Total Protein 03/18/2015 7.1  6.5 - 8.1 g/dL Final  . Albumin 03/18/2015 3.9  3.5 - 5.0 g/dL Final  . AST 03/18/2015 23  15 - 41 U/L Final  . ALT 03/18/2015 24  14 - 54 U/L Final  . Alkaline Phosphatase 03/18/2015 65  38 - 126 U/L Final  . Total Bilirubin 03/18/2015 0.5  0.3 - 1.2 mg/dL Final  . GFR calc non Af  Amer 03/18/2015 >60  >60 mL/min Final  . GFR calc Af Amer 03/18/2015 >60  >60 mL/min Final   Comment: (NOTE) The eGFR has been calculated using the CKD EPI equation. This calculation has not been validated in all clinical situations. eGFR's persistently <60 mL/min signify possible Chronic Kidney Disease.   . Anion gap 03/18/2015 3* 5 - 15 Final  . LDH 03/18/2015 151  98 - 192 U/L Final    Lab Results  Component Value Date   LABCA2 114.0* 01/03/2012   Lab Results  Component Value Date   CA199 14 01/04/2012   Lab Results  Component Value Date   CEA 1.1 01/03/2012   No results found for: PSA Lab Results  Component Value Date   CA125 409.3* 01/03/2012     STUDIES: Nm Pet Image Restag (ps) Skull Base To Thigh  03/12/2015   CLINICAL DATA:  Subsequent treatment strategy for lymphoma.  EXAM: NUCLEAR MEDICINE PET SKULL BASE TO THIGH  TECHNIQUE: 12.95 mCi F-18 FDG was injected intravenously. Full-ring PET imaging was performed from the skull base to thigh after the radiotracer. CT data was obtained and used for attenuation correction and anatomic localization.  FASTING BLOOD GLUCOSE:  Value: 91 mg/dl  COMPARISON:  CT abdomen pelvis dated 01/09/2015. PET-CT dated 04/13/2014.  FINDINGS: NECK  No hypermetabolic lymph nodes in the neck.  CHEST  Lungs are clear. No suspicious pulmonary nodules. No focal consolidation. No pleural effusion or pneumothorax.  The heart is normal in size. No pericardial effusion. Right chest port terminates in the upper right atrium.  No hypermetabolic mediastinal or hilar lymphadenopathy. 6 mm short axis right axillary node (series 3/ image 58), max SUV 1.9, favored to be reactive.  ABDOMEN/PELVIS  No abnormal hypermetabolic activity within the liver, pancreas, adrenal glands, or spleen. Mild hepatic steatosis.  Two nonobstructing right renal calculi measuring up to 4 mm in the right lower pole (series 3/image 158). Colonic diverticulosis, without evidence of  diverticulitis.  No hypermetabolic lymph nodes in the abdomen or  pelvis. 6 mm short axis right inguinal node (series 3/image 228), max SUV 1.9, favored to be reactive.  SKELETON  No focal hypermetabolic activity to suggest skeletal metastasis.  IMPRESSION: No evidence of active lymphomatous involvement.   Electronically Signed   By: Julian Hy M.D.   On: 03/12/2015 15:34    ASSESSMENT: 44 year old lady with stage IV lymphoma.  Diffuse B large cell status post chemotherapy therapy with consolidation high-dose chemotherapy   MEDICAL DECISION MAKING:  All lab data has been reviewed. PET scan has been reviewed independently and reviewed with the patient that is no evidence of recurrent or progressive disease. A return appointment in 6 months or before the patient develops any new symptoms  Patient expressed understanding and was in agreement with this plan. She also understands that She can call clinic at any time with any questions, concerns, or complaints.     Forest Gleason, MD   03/18/2015 8:23 PM

## 2015-03-18 NOTE — Progress Notes (Signed)
Patient does not have living will.  Materials given previously. Former smoker.  Patient complains of fatigue and ongoing upset stomach.

## 2015-03-25 ENCOUNTER — Ambulatory Visit: Payer: BLUE CROSS/BLUE SHIELD

## 2015-03-30 ENCOUNTER — Ambulatory Visit: Payer: BLUE CROSS/BLUE SHIELD | Admitting: Oncology

## 2015-03-30 ENCOUNTER — Other Ambulatory Visit: Payer: BLUE CROSS/BLUE SHIELD

## 2015-04-29 ENCOUNTER — Inpatient Hospital Stay: Payer: BLUE CROSS/BLUE SHIELD | Attending: Oncology

## 2015-04-29 DIAGNOSIS — Z23 Encounter for immunization: Secondary | ICD-10-CM | POA: Diagnosis not present

## 2015-04-29 DIAGNOSIS — Z452 Encounter for adjustment and management of vascular access device: Secondary | ICD-10-CM | POA: Insufficient documentation

## 2015-04-29 DIAGNOSIS — Z9221 Personal history of antineoplastic chemotherapy: Secondary | ICD-10-CM | POA: Diagnosis not present

## 2015-04-29 DIAGNOSIS — C8338 Diffuse large B-cell lymphoma, lymph nodes of multiple sites: Secondary | ICD-10-CM | POA: Diagnosis not present

## 2015-04-29 DIAGNOSIS — C8590 Non-Hodgkin lymphoma, unspecified, unspecified site: Secondary | ICD-10-CM

## 2015-04-29 MED ORDER — INFLUENZA VAC SPLIT QUAD 0.5 ML IM SUSY
0.5000 mL | PREFILLED_SYRINGE | Freq: Once | INTRAMUSCULAR | Status: AC
  Administered 2015-04-29: 0.5 mL via INTRAMUSCULAR
  Filled 2015-04-29: qty 0.5

## 2015-04-29 MED ORDER — HEPARIN SOD (PORK) LOCK FLUSH 100 UNIT/ML IV SOLN
INTRAVENOUS | Status: AC
  Filled 2015-04-29: qty 5

## 2015-04-29 MED ORDER — HEPARIN SOD (PORK) LOCK FLUSH 100 UNIT/ML IV SOLN
500.0000 [IU] | Freq: Once | INTRAVENOUS | Status: AC
  Administered 2015-04-29: 500 [IU] via INTRAVENOUS

## 2015-04-29 MED ORDER — SODIUM CHLORIDE 0.9 % IJ SOLN
10.0000 mL | Freq: Once | INTRAMUSCULAR | Status: AC
  Administered 2015-04-29: 10 mL via INTRAVENOUS
  Filled 2015-04-29: qty 10

## 2015-06-10 ENCOUNTER — Inpatient Hospital Stay: Payer: BLUE CROSS/BLUE SHIELD | Attending: Oncology

## 2015-06-10 DIAGNOSIS — Z9221 Personal history of antineoplastic chemotherapy: Secondary | ICD-10-CM | POA: Insufficient documentation

## 2015-06-10 DIAGNOSIS — C8338 Diffuse large B-cell lymphoma, lymph nodes of multiple sites: Secondary | ICD-10-CM | POA: Diagnosis not present

## 2015-06-10 DIAGNOSIS — C8590 Non-Hodgkin lymphoma, unspecified, unspecified site: Secondary | ICD-10-CM

## 2015-06-10 DIAGNOSIS — Z452 Encounter for adjustment and management of vascular access device: Secondary | ICD-10-CM | POA: Insufficient documentation

## 2015-06-10 MED ORDER — HEPARIN SOD (PORK) LOCK FLUSH 100 UNIT/ML IV SOLN
500.0000 [IU] | Freq: Once | INTRAVENOUS | Status: AC
  Administered 2015-06-10: 500 [IU] via INTRAVENOUS

## 2015-06-10 MED ORDER — HEPARIN SOD (PORK) LOCK FLUSH 100 UNIT/ML IV SOLN
INTRAVENOUS | Status: AC
  Filled 2015-06-10: qty 5

## 2015-06-10 MED ORDER — SODIUM CHLORIDE 0.9 % IJ SOLN
10.0000 mL | Freq: Once | INTRAMUSCULAR | Status: AC
  Administered 2015-06-10: 10 mL via INTRAVENOUS
  Filled 2015-06-10: qty 10

## 2015-06-28 ENCOUNTER — Telehealth: Payer: Self-pay | Admitting: *Deleted

## 2015-06-28 NOTE — Telephone Encounter (Signed)
Patient requesting refill for Celexa.

## 2015-06-29 MED ORDER — CITALOPRAM HYDROBROMIDE 10 MG PO TABS: 10.0000 mg | ORAL_TABLET | Freq: Every day | ORAL | Status: DC

## 2015-06-29 NOTE — Telephone Encounter (Signed)
Called patient to inform her that prescription has been sent for Celexa.

## 2015-06-29 NOTE — Telephone Encounter (Signed)
Refill for celexa has been sent to pharmacy.

## 2015-07-22 ENCOUNTER — Inpatient Hospital Stay: Payer: BLUE CROSS/BLUE SHIELD | Attending: Oncology

## 2015-07-30 ENCOUNTER — Inpatient Hospital Stay: Payer: BLUE CROSS/BLUE SHIELD

## 2015-08-09 ENCOUNTER — Inpatient Hospital Stay: Payer: BLUE CROSS/BLUE SHIELD | Attending: Oncology

## 2015-08-09 DIAGNOSIS — C8330 Diffuse large B-cell lymphoma, unspecified site: Secondary | ICD-10-CM

## 2015-08-09 DIAGNOSIS — Z9221 Personal history of antineoplastic chemotherapy: Secondary | ICD-10-CM | POA: Diagnosis not present

## 2015-08-09 DIAGNOSIS — C8338 Diffuse large B-cell lymphoma, lymph nodes of multiple sites: Secondary | ICD-10-CM | POA: Insufficient documentation

## 2015-08-09 DIAGNOSIS — Z452 Encounter for adjustment and management of vascular access device: Secondary | ICD-10-CM | POA: Insufficient documentation

## 2015-08-09 MED ORDER — HEPARIN SOD (PORK) LOCK FLUSH 100 UNIT/ML IV SOLN
500.0000 [IU] | Freq: Once | INTRAVENOUS | Status: AC
  Administered 2015-08-09: 500 [IU] via INTRAVENOUS
  Filled 2015-08-09: qty 5

## 2015-08-09 MED ORDER — SODIUM CHLORIDE 0.9 % IJ SOLN
10.0000 mL | Freq: Once | INTRAMUSCULAR | Status: AC
  Administered 2015-08-09: 10 mL via INTRAVENOUS
  Filled 2015-08-09: qty 10

## 2015-09-02 ENCOUNTER — Inpatient Hospital Stay: Payer: BLUE CROSS/BLUE SHIELD

## 2015-09-16 ENCOUNTER — Ambulatory Visit: Payer: BLUE CROSS/BLUE SHIELD | Admitting: Oncology

## 2015-09-16 ENCOUNTER — Other Ambulatory Visit: Payer: BLUE CROSS/BLUE SHIELD

## 2015-09-27 ENCOUNTER — Inpatient Hospital Stay: Payer: BLUE CROSS/BLUE SHIELD | Attending: Oncology | Admitting: Oncology

## 2015-09-27 ENCOUNTER — Inpatient Hospital Stay: Payer: BLUE CROSS/BLUE SHIELD

## 2015-09-27 ENCOUNTER — Inpatient Hospital Stay (HOSPITAL_BASED_OUTPATIENT_CLINIC_OR_DEPARTMENT_OTHER): Payer: BLUE CROSS/BLUE SHIELD

## 2015-09-27 ENCOUNTER — Encounter: Payer: Self-pay | Admitting: Oncology

## 2015-09-27 VITALS — BP 120/83 | HR 92 | Temp 96.6°F | Resp 18 | Wt 221.6 lb

## 2015-09-27 DIAGNOSIS — Z9221 Personal history of antineoplastic chemotherapy: Secondary | ICD-10-CM

## 2015-09-27 DIAGNOSIS — Z87442 Personal history of urinary calculi: Secondary | ICD-10-CM | POA: Diagnosis not present

## 2015-09-27 DIAGNOSIS — R140 Abdominal distension (gaseous): Secondary | ICD-10-CM | POA: Diagnosis not present

## 2015-09-27 DIAGNOSIS — C8338 Diffuse large B-cell lymphoma, lymph nodes of multiple sites: Secondary | ICD-10-CM

## 2015-09-27 DIAGNOSIS — C8330 Diffuse large B-cell lymphoma, unspecified site: Secondary | ICD-10-CM

## 2015-09-27 DIAGNOSIS — N189 Chronic kidney disease, unspecified: Secondary | ICD-10-CM

## 2015-09-27 DIAGNOSIS — Z79899 Other long term (current) drug therapy: Secondary | ICD-10-CM

## 2015-09-27 DIAGNOSIS — Z87891 Personal history of nicotine dependence: Secondary | ICD-10-CM

## 2015-09-27 DIAGNOSIS — Z95828 Presence of other vascular implants and grafts: Secondary | ICD-10-CM

## 2015-09-27 DIAGNOSIS — Z1231 Encounter for screening mammogram for malignant neoplasm of breast: Secondary | ICD-10-CM

## 2015-09-27 DIAGNOSIS — C8337 Diffuse large B-cell lymphoma, spleen: Secondary | ICD-10-CM | POA: Insufficient documentation

## 2015-09-27 LAB — CBC WITH DIFFERENTIAL/PLATELET
Basophils Absolute: 0 10*3/uL (ref 0–0.1)
Basophils Relative: 0 %
Eosinophils Absolute: 0.1 10*3/uL (ref 0–0.7)
Eosinophils Relative: 1 %
HCT: 38.7 % (ref 35.0–47.0)
Hemoglobin: 13.2 g/dL (ref 12.0–16.0)
Lymphocytes Relative: 23 %
Lymphs Abs: 1.9 10*3/uL (ref 1.0–3.6)
MCH: 29.9 pg (ref 26.0–34.0)
MCHC: 34.2 g/dL (ref 32.0–36.0)
MCV: 87.4 fL (ref 80.0–100.0)
Monocytes Absolute: 0.4 10*3/uL (ref 0.2–0.9)
Monocytes Relative: 5 %
Neutro Abs: 5.9 10*3/uL (ref 1.4–6.5)
Neutrophils Relative %: 71 %
Platelets: 199 10*3/uL (ref 150–440)
RBC: 4.42 MIL/uL (ref 3.80–5.20)
RDW: 12.9 % (ref 11.5–14.5)
WBC: 8.2 10*3/uL (ref 3.6–11.0)

## 2015-09-27 LAB — COMPREHENSIVE METABOLIC PANEL
ALT: 26 U/L (ref 14–54)
AST: 24 U/L (ref 15–41)
Albumin: 4.3 g/dL (ref 3.5–5.0)
Alkaline Phosphatase: 62 U/L (ref 38–126)
Anion gap: 4 — ABNORMAL LOW (ref 5–15)
BUN: 14 mg/dL (ref 6–20)
CO2: 27 mmol/L (ref 22–32)
Calcium: 8.5 mg/dL — ABNORMAL LOW (ref 8.9–10.3)
Chloride: 103 mmol/L (ref 101–111)
Creatinine, Ser: 0.95 mg/dL (ref 0.44–1.00)
GFR calc Af Amer: >60 mL/min (ref >60)
GFR calc non Af Amer: >60 mL/min (ref >60)
Glucose, Bld: 106 mg/dL — ABNORMAL HIGH (ref 65–99)
Potassium: 3.9 mmol/L (ref 3.5–5.1)
Sodium: 134 mmol/L — ABNORMAL LOW (ref 135–145)
Total Bilirubin: 0.7 mg/dL (ref 0.3–1.2)
Total Protein: 7.3 g/dL (ref 6.5–8.1)

## 2015-09-27 LAB — TSH: TSH: 4.622 u[IU]/mL — ABNORMAL HIGH (ref 0.350–4.500)

## 2015-09-27 MED ORDER — HEPARIN SOD (PORK) LOCK FLUSH 100 UNIT/ML IV SOLN
500.0000 [IU] | Freq: Once | INTRAVENOUS | Status: AC
  Administered 2015-09-27: 500 [IU] via INTRAVENOUS

## 2015-09-27 MED ORDER — SODIUM CHLORIDE 0.9% FLUSH
10.0000 mL | INTRAVENOUS | Status: DC | PRN
  Administered 2015-09-27: 10 mL via INTRAVENOUS
  Filled 2015-09-27: qty 10

## 2015-09-27 NOTE — Progress Notes (Signed)
Pt feeling bloated continuously. Also having pressure in head. Having right flank pain. History of renal calculi.

## 2015-09-27 NOTE — Progress Notes (Signed)
[place] @ [facility] Telephone:(336) [phone]  Fax:([phone]) [place]: [birth year]  MR#: [id number]  QJJ#:[id number]  Patient Care Team: No Pcp Per Patient as PCP - General (General Practice)  CHIEF COMPLAINT:  Chief Complaint  Patient presents with  . Lymphoma     Oncology History   Chief Complaint/Diagnosis:   35. 45 year old lady was admitted in the hospital with hypercalcemia and abnormal CT scan of the abdomen  2. 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 Patient was started on RCHOP chemotherapy June of 2013 4. High-dose methotrexate in alternate cycles starting from July 29. 5.last dose of all chemotherapy with R. CHOP (April 29, 2012) patient received total 6 cycles of chemotherapy and tolerated treatment very well without any significant side effect 6.and underwent high dose chemotherapy and stem cell support High-dose chemotherapy with BEAM regimen.  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  Since last evaluation patient does not have any significant problem.  No chills.  No fever.  Patient is gaining weight.  There well-appearing moderate obesity Patient complains of some bloating and abdomen have not changed since last evaluation. Last PET scan was done in August of 2016 was normal Patient had a kidney stone seen in June of last year which according to patient at past.  She has some aching discomfort in the right costophrenic angle patient also had herpes zoster in that area She is due for screening mammogram Here for further follow-up and treatment  consideration REVIEW OF SYSTEMS:   GENERAL:  Feels good.  Active.  No fevers, sweats or weight loss. PERFORMANCE STATUS (ECOG):  0 HEENT:  No visual changes, runny nose, sore throat, mouth sores or tenderness. Lungs: No shortness of breath or cough.  No hemoptysis. Cardiac:  No chest pain, palpitations, orthopnea, or PND. GI:  No nausea, vomiting, diarrhea, constipation, melena or hematochezia. GU:  No urgency, frequency, dysuria, or hematuria. Musculoskeletal:  No back pain.  No joint pain.  No muscle tenderness. Extremities:  No pain or swelling. Skin:  No rashes or skin changes. Neuro:  No headache, numbness or weakness, balance or coordination issues. Endocrine:  No diabetes, thyroid issues, hot flashes or night sweats. Psych:  No mood changes, depression or anxiety. Pain:  No focal pain. Review of systems:  All other systems reviewed and found to be negative.  As per HPI. Otherwise, a complete review of systems is negatve.  PAST MEDICAL HISTORY: Past Medical History  Diagnosis Date  . Chronic kidney disease   . Hypoalbuminemia   . Anemia   . Weight loss   . Fatigue   . Chronic sinusitis   . Cancer [facility])     PAST SURGICAL HISTORY: Past Surgical History  Procedure Laterality Date  . Rectal prolapse repair    . Abdominal hysterectomy    . Tubal ligation    . Bladder tacking    . 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  PFSH: Comments: Patient is adopted  Social History: negative alcohol, negative tobacco  Comments: Used to smoke in the past  Additional Past Medical and Surgical History: No significant past medical history   FAMILY HISTORY She  was adopted      ADVANCED DIRECTIVES: Patient does not have any advanced  healthcare directive. Information has been given.   HEALTH MAINTENANCE: Social History  Substance Use Topics  . Smoking status: Former Smoker -- 1.00 packs/day    Types: Cigarettes    Quit date: 12/16/1998  . Smokeless tobacco: Never Used  . Alcohol Use: Yes     Comment: Occassional    :  Allergies  Allergen Reactions  . Penicillins Shortness Of Breath    Current Outpatient Prescriptions  Medication Sig Dispense Refill  . citalopram (CELEXA) 10 MG tablet Take 1 tablet (10 mg total) by mouth daily. 30 tablet 6  . flunisolide (NASALIDE) 25 MCG/ACT (0.025%) SOLN     . loperamide (IMODIUM) 2 MG capsule     . ondansetron (ZOFRAN ODT) 4 MG disintegrating tablet Take 1 tablet (4 mg total) by mouth every 6 (six) hours as needed for nausea or vomiting. 20 tablet 0  . oxyCODONE-acetaminophen (ROXICET) 5-325 MG per tablet Take 1 tablet by mouth every 6 (six) hours as needed for severe pain. 20 tablet 0  . tamsulosin (FLOMAX) 0.4 MG CAPS capsule Take 1 capsule (0.4 mg total) by mouth daily. 30 capsule 0  . [DISCONTINUED] omeprazole (PRILOSEC) 20 MG capsule Take 20 mg by mouth daily.    . [DISCONTINUED] PROAIR HFA 108 (90 BASE) MCG/ACT inhaler Inhale 2 puffs into the lungs every 4 (four) hours as needed for wheezing or shortness of breath.   0   No current facility-administered medications for this visit.    OBJECTIVE:  Filed Vitals:   09/27/15 1022  BP: 120/83  Pulse: 92  Temp: 96.6 F (35.9 C)  Resp: 18     Body mass index is 33.7 kg/(m^2).    ECOG FS:0 - Asymptomatic  PHYSICAL EXAM: Goal status: Performance status is good.  Patient has not lost significant weight HEENT: No evidence of stomatitis. Sclera and conjunctivae :: No jaundice.   pale looking. Lungs: Air  entry equal on both sides.  No rhonchi.  No rales.  Cardiac: Heart sounds are normal.  No pericardial rub.  No murmur. Lymphatic system: Cervical, axillary, inguinal, lymph nodes not palpable GI: Abdomen is soft.   No ascites.  Liver spleen not palpable.  No tenderness.  Bowel sounds are within normal limit Lower extremity: No edema Neurological system: Higher functions, cranial nerves intact no evidence of peripheral neuropathy. Skin: No rash.  No ecchymosis.Marland Kitchen   LAB RESULTS:  Appointment on 09/27/2015  Component Date Value Ref Range Status  . WBC 09/27/2015 8.2  3.6 - 11.0 K/uL Final  . RBC 09/27/2015 4.42  3.80 - 5.20 MIL/uL Final  . Hemoglobin 09/27/2015 13.2  12.0 - 16.0 g/dL Final  . HCT 09/27/2015 38.7  35.0 - 47.0 % Final  . MCV 09/27/2015 87.4  80.0 - 100.0 fL Final  . MCH 09/27/2015 29.9  26.0 - 34.0 pg Final  . MCHC 09/27/2015 34.2  32.0 - 36.0 g/dL Final  . RDW 09/27/2015 12.9  11.5 - 14.5 % Final  . Platelets 09/27/2015 199  150 - 440 K/uL Final  . Neutrophils Relative %  09/27/2015 71   Final  . Neutro Abs 09/27/2015 5.9  1.4 - 6.5 K/uL Final  . Lymphocytes Relative 09/27/2015 23   Final  . Lymphs Abs 09/27/2015 1.9  1.0 - 3.6 K/uL Final  . Monocytes Relative 09/27/2015 5   Final  . Monocytes Absolute 09/27/2015 0.4  0.2 - 0.9 K/uL Final  . Eosinophils Relative 09/27/2015 1   Final  . Eosinophils Absolute 09/27/2015 0.1  0 - 0.7 K/uL Final  . Basophils Relative 09/27/2015 0   Final  . Basophils Absolute 09/27/2015 0.0  0 - 0.1 K/uL Final  . Sodium 09/27/2015 134* 135 - 145 mmol/L Final  . Potassium 09/27/2015 3.9  3.5 - 5.1 mmol/L Final  . Chloride 09/27/2015 103  101 - 111 mmol/L Final  . CO2 09/27/2015 27  22 - 32 mmol/L Final  . Glucose, Bld 09/27/2015 106* 65 - 99 mg/dL Final  . BUN 09/27/2015 14  6 - 20 mg/dL Final  . Creatinine, Ser 09/27/2015 0.95  0.44 - 1.00 mg/dL Final  . Calcium 09/27/2015 8.5* 8.9 - 10.3 mg/dL Final  . Total Protein 09/27/2015 7.3  6.5 - 8.1 g/dL Final  . Albumin 09/27/2015 4.3  3.5 - 5.0 g/dL Final  . AST 09/27/2015 24  15 - 41 U/L Final  . ALT 09/27/2015 26  14 - 54 U/L Final  . Alkaline Phosphatase 09/27/2015 62  38 - 126 U/L Final  . Total  Bilirubin 09/27/2015 0.7  0.3 - 1.2 mg/dL Final  . GFR calc non Af Amer 09/27/2015 >60  >60 mL/min Final  . GFR calc Af Amer 09/27/2015 >60  >60 mL/min Final   Comment: (NOTE) The eGFR has been calculated using the CKD EPI equation. This calculation has not been validated in all clinical situations. eGFR's persistently <60 mL/min signify possible Chronic Kidney Disease.   . Anion gap 09/27/2015 4* 5 - 15 Final    No results found for: PSA Lab Results  Component Value Date   CA125 409.3* 01/03/2012     STUDIES: No results found.  ASSESSMENT: 45 year old lady with stage IV lymphoma.  Diffuse B large cell status post chemotherapy therapy with consolidation high-dose chemotherapy   MEDICAL DECISION MAKING:  All lab data has been reviewed. No evidence of recurrent or progressive disease. Routine T4 and TSH is pending. Screening mammogram is been scheduled patient will be evaluated in 6 months for follow-up regarding diffuse large B-cell lymphoma Another PET scan has been scheduled in August of 2017 if insurance does not approve that then is routine CT scan can be done and if it is abnormal PET scan can be followed  The patient was advised to see urology if  pain in the right costophrenic angle continues as a part of follow-up for kidney stone Patient still had a port and can be removed at any time patient desires to keep it till five-year marks. I told patient that it can be removed at any time if she desires.  My planned retirement in 6 months patient would be seen by one of my associate  Patient expressed understanding and was in agreement with this plan. She also understands that She can call clinic at any time with any questions, concerns, or complaints.     Forest Gleason, MD   09/27/2015 10:48 AM

## 2015-09-28 ENCOUNTER — Other Ambulatory Visit: Payer: BLUE CROSS/BLUE SHIELD

## 2015-09-28 ENCOUNTER — Ambulatory Visit: Payer: BLUE CROSS/BLUE SHIELD | Admitting: Oncology

## 2015-09-28 LAB — T4: T4, Total: 7.9 ug/dL (ref 4.5–12.0)

## 2015-09-30 ENCOUNTER — Ambulatory Visit: Payer: BLUE CROSS/BLUE SHIELD | Admitting: Oncology

## 2015-09-30 ENCOUNTER — Other Ambulatory Visit: Payer: BLUE CROSS/BLUE SHIELD

## 2015-10-04 ENCOUNTER — Ambulatory Visit
Admission: RE | Admit: 2015-10-04 | Discharge: 2015-10-04 | Disposition: A | Discharge status: Home / Self Care | Payer: BLUE CROSS/BLUE SHIELD | Source: Ambulatory Visit | Attending: Oncology | Admitting: Oncology

## 2015-10-04 DIAGNOSIS — Z1231 Encounter for screening mammogram for malignant neoplasm of breast: Secondary | ICD-10-CM | POA: Diagnosis present

## 2015-10-12 ENCOUNTER — Telehealth: Payer: Self-pay | Admitting: *Deleted

## 2015-10-14 ENCOUNTER — Telehealth: Payer: Self-pay | Admitting: *Deleted

## 2015-10-14 NOTE — Telephone Encounter (Signed)
Patient would like to know TSH results.  Gave patient results.  Slightly elevated.  MD will continue to watch.  Patient verbalized understanding.

## 2015-12-17 ENCOUNTER — Inpatient Hospital Stay: Payer: BLUE CROSS/BLUE SHIELD | Attending: Internal Medicine

## 2015-12-17 ENCOUNTER — Encounter (INDEPENDENT_AMBULATORY_CARE_PROVIDER_SITE_OTHER): Payer: Self-pay

## 2015-12-17 DIAGNOSIS — C8338 Diffuse large B-cell lymphoma, lymph nodes of multiple sites: Secondary | ICD-10-CM | POA: Insufficient documentation

## 2015-12-17 DIAGNOSIS — C801 Malignant (primary) neoplasm, unspecified: Secondary | ICD-10-CM

## 2015-12-17 DIAGNOSIS — Z9221 Personal history of antineoplastic chemotherapy: Secondary | ICD-10-CM | POA: Insufficient documentation

## 2015-12-17 MED ORDER — HEPARIN SOD (PORK) LOCK FLUSH 100 UNIT/ML IV SOLN
500.0000 [IU] | Freq: Once | INTRAVENOUS | Status: AC
  Administered 2015-12-17: 500 [IU] via INTRAVENOUS

## 2015-12-17 MED ORDER — SODIUM CHLORIDE 0.9% FLUSH
10.0000 mL | INTRAVENOUS | Status: DC | PRN
  Administered 2015-12-17: 10 mL via INTRAVENOUS
  Filled 2015-12-17: qty 10

## 2016-01-28 ENCOUNTER — Encounter (INDEPENDENT_AMBULATORY_CARE_PROVIDER_SITE_OTHER): Payer: Self-pay

## 2016-01-28 ENCOUNTER — Inpatient Hospital Stay: Payer: BLUE CROSS/BLUE SHIELD | Attending: Internal Medicine

## 2016-01-28 DIAGNOSIS — Z95828 Presence of other vascular implants and grafts: Secondary | ICD-10-CM

## 2016-01-28 DIAGNOSIS — Z8572 Personal history of non-Hodgkin lymphomas: Secondary | ICD-10-CM | POA: Diagnosis not present

## 2016-01-28 DIAGNOSIS — Z452 Encounter for adjustment and management of vascular access device: Secondary | ICD-10-CM | POA: Insufficient documentation

## 2016-01-28 MED ORDER — SODIUM CHLORIDE 0.9% FLUSH
10.0000 mL | INTRAVENOUS | Status: DC | PRN
  Administered 2016-01-28: 10 mL via INTRAVENOUS
  Filled 2016-01-28: qty 10

## 2016-01-28 MED ORDER — HEPARIN SOD (PORK) LOCK FLUSH 100 UNIT/ML IV SOLN
500.0000 [IU] | Freq: Once | INTRAVENOUS | Status: AC
  Administered 2016-01-28: 500 [IU] via INTRAVENOUS

## 2016-03-10 ENCOUNTER — Inpatient Hospital Stay: Payer: BLUE CROSS/BLUE SHIELD | Attending: Internal Medicine

## 2016-03-10 DIAGNOSIS — Z8572 Personal history of non-Hodgkin lymphomas: Secondary | ICD-10-CM | POA: Insufficient documentation

## 2016-03-10 DIAGNOSIS — Z79899 Other long term (current) drug therapy: Secondary | ICD-10-CM | POA: Insufficient documentation

## 2016-03-10 DIAGNOSIS — C801 Malignant (primary) neoplasm, unspecified: Secondary | ICD-10-CM

## 2016-03-10 MED ORDER — SODIUM CHLORIDE 0.9% FLUSH
10.0000 mL | INTRAVENOUS | Status: DC | PRN
  Filled 2016-03-10: qty 10

## 2016-03-10 MED ORDER — HEPARIN SOD (PORK) LOCK FLUSH 100 UNIT/ML IV SOLN
500.0000 [IU] | INTRAVENOUS | Status: AC | PRN
  Administered 2016-03-10: 500 [IU]

## 2016-03-10 MED ORDER — SODIUM CHLORIDE 0.9% FLUSH
10.0000 mL | INTRAVENOUS | Status: AC | PRN
  Administered 2016-03-10: 10 mL
  Filled 2016-03-10: qty 10

## 2016-03-31 ENCOUNTER — Other Ambulatory Visit: Payer: Self-pay | Admitting: *Deleted

## 2016-03-31 ENCOUNTER — Ambulatory Visit: Payer: BLUE CROSS/BLUE SHIELD | Admitting: Internal Medicine

## 2016-03-31 ENCOUNTER — Other Ambulatory Visit: Payer: Self-pay | Admitting: Internal Medicine

## 2016-03-31 DIAGNOSIS — C8330 Diffuse large B-cell lymphoma, unspecified site: Secondary | ICD-10-CM

## 2016-04-03 ENCOUNTER — Ambulatory Visit: Payer: BLUE CROSS/BLUE SHIELD

## 2016-04-05 ENCOUNTER — Inpatient Hospital Stay: Payer: BLUE CROSS/BLUE SHIELD | Admitting: Internal Medicine

## 2016-04-05 ENCOUNTER — Ambulatory Visit
Admission: RE | Admit: 2016-04-05 | Discharge: 2016-04-05 | Disposition: A | Discharge status: Home / Self Care | Payer: BLUE CROSS/BLUE SHIELD | Source: Ambulatory Visit | Attending: Internal Medicine | Admitting: Internal Medicine

## 2016-04-05 ENCOUNTER — Inpatient Hospital Stay: Payer: BLUE CROSS/BLUE SHIELD

## 2016-04-05 DIAGNOSIS — K760 Fatty (change of) liver, not elsewhere classified: Secondary | ICD-10-CM | POA: Insufficient documentation

## 2016-04-05 DIAGNOSIS — R591 Generalized enlarged lymph nodes: Secondary | ICD-10-CM | POA: Diagnosis not present

## 2016-04-05 DIAGNOSIS — I700 Atherosclerosis of aorta: Secondary | ICD-10-CM | POA: Diagnosis not present

## 2016-04-05 DIAGNOSIS — C8330 Diffuse large B-cell lymphoma, unspecified site: Secondary | ICD-10-CM

## 2016-04-05 DIAGNOSIS — N200 Calculus of kidney: Secondary | ICD-10-CM | POA: Diagnosis not present

## 2016-04-05 HISTORY — DX: Diffuse large B-cell lymphoma, unspecified site: C83.30

## 2016-04-05 MED ORDER — IOPAMIDOL (ISOVUE-300) INJECTION 61%: 100.0000 mL | Freq: Once | INTRAVENOUS | Status: DC | PRN

## 2016-04-07 ENCOUNTER — Inpatient Hospital Stay: Payer: BLUE CROSS/BLUE SHIELD | Attending: Internal Medicine

## 2016-04-07 ENCOUNTER — Encounter: Payer: Self-pay | Admitting: Internal Medicine

## 2016-04-07 ENCOUNTER — Inpatient Hospital Stay (HOSPITAL_BASED_OUTPATIENT_CLINIC_OR_DEPARTMENT_OTHER): Payer: BLUE CROSS/BLUE SHIELD | Admitting: Internal Medicine

## 2016-04-07 ENCOUNTER — Inpatient Hospital Stay: Payer: BLUE CROSS/BLUE SHIELD

## 2016-04-07 VITALS — BP 109/74 | HR 98 | Temp 97.9°F | Resp 18 | Ht 68.0 in | Wt 224.4 lb

## 2016-04-07 DIAGNOSIS — R634 Abnormal weight loss: Secondary | ICD-10-CM | POA: Insufficient documentation

## 2016-04-07 DIAGNOSIS — D649 Anemia, unspecified: Secondary | ICD-10-CM

## 2016-04-07 DIAGNOSIS — Z79899 Other long term (current) drug therapy: Secondary | ICD-10-CM

## 2016-04-07 DIAGNOSIS — E8809 Other disorders of plasma-protein metabolism, not elsewhere classified: Secondary | ICD-10-CM

## 2016-04-07 DIAGNOSIS — C8330 Diffuse large B-cell lymphoma, unspecified site: Secondary | ICD-10-CM | POA: Insufficient documentation

## 2016-04-07 DIAGNOSIS — Z87891 Personal history of nicotine dependence: Secondary | ICD-10-CM | POA: Insufficient documentation

## 2016-04-07 DIAGNOSIS — Z9484 Stem cells transplant status: Secondary | ICD-10-CM

## 2016-04-07 DIAGNOSIS — R5383 Other fatigue: Secondary | ICD-10-CM | POA: Diagnosis not present

## 2016-04-07 DIAGNOSIS — Z9221 Personal history of antineoplastic chemotherapy: Secondary | ICD-10-CM | POA: Insufficient documentation

## 2016-04-07 DIAGNOSIS — N189 Chronic kidney disease, unspecified: Secondary | ICD-10-CM

## 2016-04-07 LAB — LACTATE DEHYDROGENASE: LDH: 169 U/L (ref 98–192)

## 2016-04-07 LAB — CBC WITH DIFFERENTIAL/PLATELET
Basophils Absolute: 0 10*3/uL (ref 0–0.1)
Basophils Relative: 1 %
Eosinophils Absolute: 0.1 10*3/uL (ref 0–0.7)
Eosinophils Relative: 2 %
HCT: 35.8 % (ref 35.0–47.0)
Hemoglobin: 12.6 g/dL (ref 12.0–16.0)
Lymphocytes Relative: 23 %
Lymphs Abs: 1.9 10*3/uL (ref 1.0–3.6)
MCH: 30.8 pg (ref 26.0–34.0)
MCHC: 35.1 g/dL (ref 32.0–36.0)
MCV: 87.7 fL (ref 80.0–100.0)
Monocytes Absolute: 0.4 10*3/uL (ref 0.2–0.9)
Monocytes Relative: 5 %
Neutro Abs: 5.6 10*3/uL (ref 1.4–6.5)
Neutrophils Relative %: 69 %
Platelets: 224 10*3/uL (ref 150–440)
RBC: 4.08 MIL/uL (ref 3.80–5.20)
RDW: 13.5 % (ref 11.5–14.5)
WBC: 8.1 10*3/uL (ref 3.6–11.0)

## 2016-04-07 LAB — COMPREHENSIVE METABOLIC PANEL
ALT: 32 U/L (ref 14–54)
AST: 26 U/L (ref 15–41)
Albumin: 3.8 g/dL (ref 3.5–5.0)
Alkaline Phosphatase: 66 U/L (ref 38–126)
Anion gap: 5 (ref 5–15)
BUN: 14 mg/dL (ref 6–20)
CO2: 26 mmol/L (ref 22–32)
Calcium: 8.8 mg/dL — ABNORMAL LOW (ref 8.9–10.3)
Chloride: 106 mmol/L (ref 101–111)
Creatinine, Ser: 0.88 mg/dL (ref 0.44–1.00)
GFR calc Af Amer: >60 mL/min (ref >60)
GFR calc non Af Amer: >60 mL/min (ref >60)
Glucose, Bld: 138 mg/dL — ABNORMAL HIGH (ref 65–99)
Potassium: 3.7 mmol/L (ref 3.5–5.1)
Sodium: 137 mmol/L (ref 135–145)
Total Bilirubin: 0.4 mg/dL (ref 0.3–1.2)
Total Protein: 6.9 g/dL (ref 6.5–8.1)

## 2016-04-07 MED ORDER — HEPARIN SOD (PORK) LOCK FLUSH 100 UNIT/ML IV SOLN
500.0000 [IU] | Freq: Once | INTRAVENOUS | Status: AC
  Administered 2016-04-07: 500 [IU] via INTRAVENOUS

## 2016-04-07 MED ORDER — ONDANSETRON 4 MG PO TBDP: 4.0000 mg | ORAL_TABLET | Freq: Four times a day (QID) | ORAL | 6 refills | Status: DC | PRN

## 2016-04-07 MED ORDER — SODIUM CHLORIDE 0.9% FLUSH
10.0000 mL | INTRAVENOUS | Status: DC | PRN
  Administered 2016-04-07: 10 mL via INTRAVENOUS
  Filled 2016-04-07: qty 10

## 2016-04-07 MED ORDER — OMEPRAZOLE 20 MG PO CPDR: 20.0000 mg | DELAYED_RELEASE_CAPSULE | Freq: Every day | ORAL | 3 refills | Status: DC

## 2016-04-07 NOTE — Assessment & Plan Note (Addendum)
#   History of diffuse large B-cell lymphoma status post autologous transplant 2013- the CT scan chest abdomen pelvis shows- 1.2 cm right inguinal lymph node; less than 1 cm lymph node in the external iliac region concerning for recurrence. Rest of the CBC normal  # ? Recurrence based on the CT scan- recommend PET scan. If PET positive/ plan Bx; if negative- follow up in 3 months CT  # pt agrees with plan.  [[phone]]. Based on the PET scan results-plan follow-up.  # 25 minutes face-to-face with the patient discussing the above plan of care; more than 50% of time spent on prognosis/ natural history; counseling and coordination.

## 2016-04-07 NOTE — Progress Notes (Signed)
Pt reports she always has nausea, fatigue...  CT scan last Wednesday

## 2016-04-07 NOTE — Progress Notes (Signed)
[place] OFFICE PROGRESS NOTE  Patient Care Team: No Pcp Per Patient as PCP - General (General Practice)  No matching staging information was found for the patient.   Oncology History   Chief Complaint/Diagnosis:   # 2013-  admitted with hypercalcemia and abnormal CT scan of the abdomen  2. 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 Patient was started on RCHOP chemotherapy June of 2013 4. High-dose methotrexate in alternate cycles starting from July 29. 5.last dose of all chemotherapy with R. CHOP (April 29, 2012) patient received total 6 cycles of chemotherapy and tolerated treatment very well without any significant side effect 6.and underwent high dose chemotherapy and stem cell support High-dose chemotherapy with BEAM regimen.  (March 24: 014  # SEP 13th 2017- ~1.2cm R Ingiunal LN; 42mm ex Iliac LN ? 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 I reviewed the patient's prior charts/pertinent labs/imaging in detail; findings are summarized above.    INTERVAL HISTORY:  Bethany Kelly 45 y.o.  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  Patient denies any weight loss. Denies any nausea vomiting denies any unusual headaches.  Patient recently had exposure to poison IV had skin rash on her legs and also upper extremities [place] been working in the yard]. No  night sweats or weight loss or any numbness  REVIEW OF SYSTEMS:  A complete 10 point review of system is done which is negative except mentioned above/history of present illness.   PAST MEDICAL HISTORY :  Past Medical History:  Diagnosis Date  . Anemia   . Chronic kidney disease   . Chronic sinusitis   . Diffuse large B cell lymphoma ([place])   . Fatigue   . Hypoalbuminemia   . Weight loss     PAST SURGICAL HISTORY :   Past Surgical History:  Procedure Laterality Date  . ABDOMINAL HYSTERECTOMY    . bladder tacking    . 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 RECTAL PROLAPSE REPAIR    . TUBAL LIGATION      FAMILY HISTORY :   Family History  Problem Relation Age of Onset  . Adopted: Yes    SOCIAL HISTORY:   Social History  Substance Use Topics  . Smoking status: Former Smoker    Packs/day: 1.00    Types: Cigarettes    Quit date: 12/16/1998  . Smokeless tobacco: Never Used  . Alcohol use Yes     Comment: Occassional    ALLERGIES:  is allergic to penicillins.  MEDICATIONS:  Current Outpatient Prescriptions  Medication Sig Dispense Refill  . albuterol (PROVENTIL HFA;VENTOLIN HFA) 108 (90 Base) MCG/ACT inhaler Inhale into the lungs.    . citalopram (CELEXA) 10 MG tablet Take 1 tablet (10 mg total) by mouth daily. 30 tablet 6  . Cyanocobalamin (RA VITAMIN B-12 TR) 1000 MCG TBCR Take by mouth.    Marland Kitchen  flunisolide (NASALIDE) 25 MCG/ACT (0.025%) SOLN     . ibuprofen (ADVIL,MOTRIN) 600 MG tablet Take by mouth.    Marland Kitchen LACTOBACILLUS RHAMNOSUS, GG, PO Take by mouth.    . loperamide (IMODIUM) 2 MG capsule     . Multiple Vitamin (MULTI-VITAMINS) TABS Take by mouth.    Marland Kitchen omeprazole (PRILOSEC) 20 MG capsule Take 1 capsule (20 mg total) by mouth daily. 30 capsule 3  . ondansetron (ZOFRAN ODT) 4 MG disintegrating tablet Take 1 tablet (4 mg total) by mouth every 6 (six) hours as needed for nausea or vomiting. 30 tablet 6  . oxyCODONE-acetaminophen (ROXICET) 5-325 MG per tablet Take 1  tablet by mouth every 6 (six) hours as needed for severe pain. 20 tablet 0  . pyridOXINE (VITAMIN B-6) 25 MG tablet Take by mouth.    . triamcinolone ointment (KENALOG) 0.1 % Apply topically.    . Turmeric Curcumin 500 MG CAPS Take by mouth.    . tamsulosin (FLOMAX) 0.4 MG CAPS capsule Take 1 capsule (0.4 mg total) by mouth daily. (Patient not taking: Reported on 04/07/2016) 30 capsule 0   No current facility-administered medications for this visit.     PHYSICAL EXAMINATION: ECOG PERFORMANCE STATUS: 0 - Asymptomatic  BP 109/74 (BP Location: Left Arm, Patient Position: Sitting)   Pulse 98   Temp 97.9 F (36.6 C) (Tympanic)   Resp 18   Ht 5\' 8"  (1.727 m)   Wt 224 lb 6.4 oz (101.8 kg)   BMI 34.12 kg/m   Filed Weights   04/07/16 1357  Weight: 224 lb 6.4 oz (101.8 kg)    GENERAL: Well-nourished well-developed; Alert, no distress and comfortable.   Alone. EYES: no pallor or icterus OROPHARYNX: no thrush or ulceration; good dentition  NECK: supple, no masses felt LYMPH:  no palpable lymphadenopathy in the cervical, axillary or inguinal regions LUNGS: clear to auscultation and  No wheeze or crackles HEART/CVS: regular rate & rhythm and no murmurs; No lower extremity edema ABDOMEN:abdomen soft, non-tender and normal bowel sounds Musculoskeletal:no cyanosis of digits and no clubbing  PSYCH: alert & oriented x 3 with fluent speech NEURO: no focal motor/sensory deficits SKIN:  Macular rash noted bilateral upper extremities; overall improving as per patient  LABORATORY DATA:  I have reviewed the data as listed    Component Value Date/Time   NA 137 04/07/2016 1332   NA 141 08/19/2014 1038   K 3.7 04/07/2016 1332   K 4.3 08/19/2014 1038   CL 106 04/07/2016 1332   CL 103 08/19/2014 1038   CO2 26 04/07/2016 1332   CO2 29 08/19/2014 1038   GLUCOSE 138 (H) 04/07/2016 1332   GLUCOSE 103 (H) 08/19/2014 1038   BUN 14 04/07/2016 1332   BUN 9 08/19/2014 1038   CREATININE 0.88  04/07/2016 1332   CREATININE 1.07 08/19/2014 1038   CALCIUM 8.8 (L) 04/07/2016 1332   CALCIUM 8.6 08/19/2014 1038   PROT 6.9 04/07/2016 1332   PROT 7.0 08/19/2014 1038   ALBUMIN 3.8 04/07/2016 1332   ALBUMIN 3.7 08/19/2014 1038   AST 26 04/07/2016 1332   AST 18 08/19/2014 1038   ALT 32 04/07/2016 1332   ALT 36 08/19/2014 1038   ALKPHOS 66 04/07/2016 1332   ALKPHOS 81 08/19/2014 1038   BILITOT 0.4 04/07/2016 1332   BILITOT 0.5 08/19/2014 1038   GFRNONAA >60 04/07/2016 1332   GFRNONAA 59 (L) 08/19/2014 1038   GFRNONAA >60 03/16/2014 0911   GFRAA >60 04/07/2016 1332   GFRAA >60  08/19/2014 1038   GFRAA >60 03/16/2014 0911    No results found for: SPEP, UPEP  Lab Results  Component Value Date   WBC 8.1 04/07/2016   NEUTROABS 5.6 04/07/2016   HGB 12.6 04/07/2016   HCT 35.8 04/07/2016   MCV 87.7 04/07/2016   PLT 224 04/07/2016      Chemistry      Component Value Date/Time   NA 137 04/07/2016 1332   NA 141 08/19/2014 1038   K 3.7 04/07/2016 1332   K 4.3 08/19/2014 1038   CL 106 04/07/2016 1332   CL 103 08/19/2014 1038   CO2 26 04/07/2016 1332   CO2 29 08/19/2014 1038   BUN 14 04/07/2016 1332   BUN 9 08/19/2014 1038   CREATININE 0.88 04/07/2016 1332   CREATININE 1.07 08/19/2014 1038      Component Value Date/Time   CALCIUM 8.8 (L) 04/07/2016 1332   CALCIUM 8.6 08/19/2014 1038   ALKPHOS 66 04/07/2016 1332   ALKPHOS 81 08/19/2014 1038   AST 26 04/07/2016 1332   AST 18 08/19/2014 1038   ALT 32 04/07/2016 1332   ALT 36 08/19/2014 1038   BILITOT 0.4 04/07/2016 1332   BILITOT 0.5 08/19/2014 1038       RADIOGRAPHIC STUDIES: I have personally reviewed the radiological images as listed and agreed with the findings in the report. No results found.   ASSESSMENT & PLAN:  Malignant lymphoma, large cell, diffuse ([place]) # History of diffuse large B-cell lymphoma status post autologous transplant 2013- the CT scan chest abdomen pelvis shows- 1.2 cm right inguinal lymph  node; less than 1 cm lymph node in the external iliac region concerning for recurrence. Rest of the CBC normal  # ? Recurrence based on the CT scan- recommend PET scan. If PET positive/ plan Bx; if negative- follow up in 3 months CT  # pt agrees with plan.  [[phone]]. Based on the PET scan results-plan follow-up.  # 25 minutes face-to-face with the patient discussing the above plan of care; more than 50% of time spent on prognosis/ natural history; counseling and coordination.  # I reviewed the blood work- with the patient in detail; also reviewed the imaging independently [as summarized above]; and with the patient in detail.    Orders Placed This Encounter  Procedures  . NM PET Image Restag (PS) Skull Base To Thigh    Standing Status:   Future    Standing Expiration Date:   06/07/2017    Order Specific Question:   Reason for Exam (SYMPTOM  OR DIAGNOSIS REQUIRED)    Answer:   lymphoma    Order Specific Question:   Is the patient pregnant?    Answer:   No    Order Specific Question:   Preferred imaging location?    Answer:   [facility]  . CBC with Differential    Standing Status:   Future    Standing Expiration Date:   07/07/2017  . Comprehensive metabolic panel    Standing Status:   Future    Standing Expiration Date:   07/07/2017  . Lactate dehydrogenase    Standing Status:   Future    Standing Expiration Date:   07/07/2017   All questions were answered. The patient knows to call the clinic with any problems, questions or concerns.      Cammie Sickle, MD 04/09/2016 11:19 AM

## 2016-04-12 ENCOUNTER — Ambulatory Visit: Payer: BLUE CROSS/BLUE SHIELD | Admitting: Internal Medicine

## 2016-04-12 ENCOUNTER — Other Ambulatory Visit: Payer: BLUE CROSS/BLUE SHIELD

## 2016-04-12 ENCOUNTER — Ambulatory Visit
Admission: RE | Admit: 2016-04-12 | Discharge: 2016-04-12 | Disposition: A | Discharge status: Home / Self Care | Payer: BLUE CROSS/BLUE SHIELD | Source: Ambulatory Visit | Attending: Internal Medicine | Admitting: Internal Medicine

## 2016-04-12 DIAGNOSIS — C8330 Diffuse large B-cell lymphoma, unspecified site: Secondary | ICD-10-CM | POA: Diagnosis not present

## 2016-04-12 DIAGNOSIS — K8020 Calculus of gallbladder without cholecystitis without obstruction: Secondary | ICD-10-CM | POA: Diagnosis not present

## 2016-04-12 DIAGNOSIS — I700 Atherosclerosis of aorta: Secondary | ICD-10-CM | POA: Insufficient documentation

## 2016-04-12 DIAGNOSIS — K5730 Diverticulosis of large intestine without perforation or abscess without bleeding: Secondary | ICD-10-CM | POA: Insufficient documentation

## 2016-04-12 DIAGNOSIS — N200 Calculus of kidney: Secondary | ICD-10-CM | POA: Diagnosis not present

## 2016-04-12 DIAGNOSIS — K449 Diaphragmatic hernia without obstruction or gangrene: Secondary | ICD-10-CM | POA: Insufficient documentation

## 2016-04-12 DIAGNOSIS — K760 Fatty (change of) liver, not elsewhere classified: Secondary | ICD-10-CM | POA: Insufficient documentation

## 2016-04-12 LAB — GLUCOSE, CAPILLARY: Glucose-Capillary: 96 mg/dL (ref 65–99)

## 2016-04-12 MED ORDER — FLUDEOXYGLUCOSE F - 18 (FDG) INJECTION
12.0000 | Freq: Once | INTRAVENOUS | Status: AC
  Administered 2016-04-12: 12.02 via INTRAVENOUS

## 2016-04-17 ENCOUNTER — Telehealth: Payer: Self-pay | Admitting: Internal Medicine

## 2016-04-17 ENCOUNTER — Other Ambulatory Visit: Payer: Self-pay | Admitting: *Deleted

## 2016-04-17 DIAGNOSIS — R591 Generalized enlarged lymph nodes: Secondary | ICD-10-CM

## 2016-04-17 NOTE — Telephone Encounter (Signed)
Discussed with dr.Byrnett- recommend Excisoinal LN Biopsy- refer to Dr.Byrnett.   LVM to call us back re: Biopsy-   Heather/brnady -instead of ultrasound-guided lymph node biopsy recommend excisional biopsy; please call the patient tomorrow regarding the change of plan. And referred to Dr. Bary Castilla. Thx

## 2016-04-20 ENCOUNTER — Ambulatory Visit (INDEPENDENT_AMBULATORY_CARE_PROVIDER_SITE_OTHER): Payer: BLUE CROSS/BLUE SHIELD | Admitting: General Surgery

## 2016-04-20 ENCOUNTER — Encounter
Admission: RE | Admit: 2016-04-20 | Discharge: 2016-04-20 | Disposition: A | Discharge status: Home / Self Care | Payer: BLUE CROSS/BLUE SHIELD | Source: Ambulatory Visit | Attending: General Surgery | Admitting: General Surgery

## 2016-04-20 ENCOUNTER — Encounter: Payer: Self-pay | Admitting: General Surgery

## 2016-04-20 ENCOUNTER — Other Ambulatory Visit: Payer: Self-pay

## 2016-04-20 VITALS — BP 122/82 | HR 78 | Resp 14 | Ht 68.0 in | Wt 224.0 lb

## 2016-04-20 DIAGNOSIS — R634 Abnormal weight loss: Secondary | ICD-10-CM | POA: Diagnosis not present

## 2016-04-20 DIAGNOSIS — I129 Hypertensive chronic kidney disease with stage 1 through stage 4 chronic kidney disease, or unspecified chronic kidney disease: Secondary | ICD-10-CM | POA: Diagnosis not present

## 2016-04-20 DIAGNOSIS — J329 Chronic sinusitis, unspecified: Secondary | ICD-10-CM | POA: Diagnosis not present

## 2016-04-20 DIAGNOSIS — Z9484 Stem cells transplant status: Secondary | ICD-10-CM | POA: Diagnosis not present

## 2016-04-20 DIAGNOSIS — K219 Gastro-esophageal reflux disease without esophagitis: Secondary | ICD-10-CM | POA: Diagnosis not present

## 2016-04-20 DIAGNOSIS — R591 Generalized enlarged lymph nodes: Secondary | ICD-10-CM | POA: Diagnosis present

## 2016-04-20 DIAGNOSIS — Z90710 Acquired absence of both cervix and uterus: Secondary | ICD-10-CM | POA: Diagnosis not present

## 2016-04-20 DIAGNOSIS — Z87891 Personal history of nicotine dependence: Secondary | ICD-10-CM | POA: Diagnosis not present

## 2016-04-20 DIAGNOSIS — C8335 Diffuse large B-cell lymphoma, lymph nodes of inguinal region and lower limb: Secondary | ICD-10-CM | POA: Diagnosis not present

## 2016-04-20 DIAGNOSIS — F419 Anxiety disorder, unspecified: Secondary | ICD-10-CM | POA: Diagnosis not present

## 2016-04-20 DIAGNOSIS — E8809 Other disorders of plasma-protein metabolism, not elsewhere classified: Secondary | ICD-10-CM | POA: Diagnosis not present

## 2016-04-20 DIAGNOSIS — E669 Obesity, unspecified: Secondary | ICD-10-CM | POA: Diagnosis not present

## 2016-04-20 DIAGNOSIS — Z880 Allergy status to penicillin: Secondary | ICD-10-CM | POA: Diagnosis not present

## 2016-04-20 DIAGNOSIS — I10 Essential (primary) hypertension: Secondary | ICD-10-CM | POA: Diagnosis not present

## 2016-04-20 DIAGNOSIS — Z6834 Body mass index (BMI) 34.0-34.9, adult: Secondary | ICD-10-CM | POA: Diagnosis not present

## 2016-04-20 DIAGNOSIS — R5383 Other fatigue: Secondary | ICD-10-CM | POA: Diagnosis not present

## 2016-04-20 DIAGNOSIS — Z79899 Other long term (current) drug therapy: Secondary | ICD-10-CM | POA: Diagnosis not present

## 2016-04-20 DIAGNOSIS — Z8572 Personal history of non-Hodgkin lymphomas: Secondary | ICD-10-CM | POA: Diagnosis not present

## 2016-04-20 HISTORY — DX: Anxiety disorder, unspecified: F41.9

## 2016-04-20 HISTORY — DX: Personal history of antineoplastic chemotherapy: Z92.21

## 2016-04-20 HISTORY — DX: Essential (primary) hypertension: I10

## 2016-04-20 HISTORY — DX: Gastro-esophageal reflux disease without esophagitis: K21.9

## 2016-04-20 NOTE — Patient Instructions (Addendum)
The patient is aware to call back for any questions or concerns. Recommend excisional biopsy under anesthesia

## 2016-04-20 NOTE — H&P (Signed)
Patient ID: Bethany Kelly, female   DOB: February 02, 1971, 45 y.o.   MRN: [id number]  Chief Complaint  Patient presents with  . Other    lymph node evaluation    HPI Bethany Kelly is a 45 y.o. female.  Here today for evaluation of a right groin lymph node with a known history of B cell lymphoma followed by Dr. Rogue Bussing. PET scan was 04-12-16.  HPI  Past Medical History:  Diagnosis Date  . Anemia   . Chronic kidney disease   . Chronic sinusitis   . 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 Fatigue   . Hypoalbuminemia   . Weight loss     Past Surgical History:  Procedure Laterality Date  . ABDOMINAL HYSTERECTOMY    . bladder tacking    . 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 PORTACATH PLACEMENT  2013  . RECTAL PROLAPSE REPAIR    . TUBAL LIGATION      Family History  Problem Relation Age of Onset  . Adopted: Yes    Social History Social History  Substance Use Topics  . Smoking status: Former Smoker    Packs/day: 1.00    Types: Cigarettes    Quit date: 12/16/1998  . Smokeless tobacco: Never Used  . Alcohol use Yes     Comment: Occassional    Allergies  Allergen Reactions  . Penicillins Shortness Of Breath    Current Outpatient Prescriptions  Medication Sig Dispense Refill  . albuterol (PROVENTIL HFA;VENTOLIN HFA) 108 (90 Base) MCG/ACT inhaler Inhale into the lungs.    . citalopram (CELEXA) 10 MG tablet Take 1 tablet (10 mg total) by mouth daily. 30 tablet 6  . Cyanocobalamin (RA VITAMIN B-12 TR) 1000 MCG TBCR Take by mouth.    Marland Kitchen ibuprofen (ADVIL,MOTRIN) 600 MG tablet Take by mouth every 6 (six) hours as needed.     Marland Kitchen LACTOBACILLUS RHAMNOSUS, GG, PO Take by mouth.    . loperamide (IMODIUM) 2 MG capsule as needed.     . Multiple Vitamin (MULTI-VITAMINS) TABS Take by mouth.    Marland Kitchen omeprazole (PRILOSEC) 20 MG capsule Take 1 capsule (20 mg total) by mouth daily. 30 capsule 3  . ondansetron (ZOFRAN ODT) 4 MG disintegrating tablet Take 1 tablet (4 mg total) by  mouth every 6 (six) hours as needed for nausea or vomiting. 30 tablet 6  . oxyCODONE-acetaminophen (ROXICET) 5-325 MG per tablet Take 1 tablet by mouth every 6 (six) hours as needed for severe pain. 20 tablet 0  . pyridOXINE (VITAMIN B-6) 25 MG tablet Take by mouth.    . tamsulosin (FLOMAX) 0.4 MG CAPS capsule Take 1 capsule (0.4 mg total) by mouth daily. (Patient taking differently: Take 0.4 mg by mouth as needed. ) 30 capsule 0  . triamcinolone ointment (KENALOG) 0.1 % Apply topically as needed.     . Turmeric Curcumin 500 MG CAPS Take by mouth daily.      No current facility-administered medications for this visit.     Review of Systems Review of Systems  Constitutional: Negative.   Respiratory: Negative.   Cardiovascular: Negative.     Blood pressure 122/82, pulse 78, resp. rate 14, height 5\' 8"  (1.727 m), weight 224 lb (101.6 kg).  Physical Exam Physical Exam  Constitutional: She is oriented to person, place, and time. She appears well-developed and well-nourished.  HENT:  Mouth/Throat: Oropharynx is clear and moist.  Eyes: Conjunctivae are normal. No scleral icterus.  Neck: Neck supple.  Cardiovascular: Normal rate, regular rhythm and normal heart sounds.   Pulses:      Femoral pulses are 2+ on the right side, and 2+ on the left side. No lower leg edema  Pulmonary/Chest: Effort normal and breath sounds normal.  Abdominal: Soft. Bowel sounds are normal. There is no tenderness.    Lymphadenopathy:    She has no cervical adenopathy.    She has no axillary adenopathy.  Neurological: She is alert and oriented to person, place, and time.  Skin: Skin is warm and dry.  Psychiatric: Her behavior is normal.    Data Reviewed CT of the abdomen, pelvis and chest dated 04/05/2016 reviewed. PET scan of 04/12/2016 reviewed. 1. Enlarged hypermetabolic bilateral pelvic lymphadenopathy involving the left common iliac, left external iliac and right inguinal nodal chains, new since  03/12/2015 PET-CT, consistent with recurrent lymphoma. 2. New hypermetabolism in the right hilum, nonspecific, potentially an additional site of lymphoma recurrence. 3. Additional findings include tiny hiatal hernia, diffuse hepatic steatosis, cholelithiasis, nonobstructing bilateral nephrolithiasis, moderate sigmoid diverticulosis and aortic atherosclerosis.  Ultrasound examination of the right groin (no images, no charge) showed a dominant lymph node measuring up to 2.2 cm in diameter with loss of normal nodal architecture.  CBC dated 04/07/2016 showed a hemoglobin of 12.6 with an MCV of 88, white blood cell count of 8100 with normal differential. Platelet count of 224,000.  Comprehensive metabolic panel of the same date was notable for a modest elevation of the blood sugar per she is nonfasting) sees. Normal electrode, normal renal function, normal liver functions.  Assessment    Possible recurrent lymphoma.    Plan     The right inguinal node is the most easily accessible for complete excision. Indications for open inguinal node biopsy reviewed. Potential for lymphedema discussed.       Recommend excisional biopsy under anesthesia.   This information has been scribed by Karie Fetch RN, BSN,BC.   Robert Bellow 04/20/2016, 8:59 AM

## 2016-04-20 NOTE — Progress Notes (Deleted)
  The note originally documented on this encounter has been moved the the encounter in which it belongs.  

## 2016-04-20 NOTE — Patient Instructions (Signed)
  Your procedure is scheduled [id number] 29, 2017 Report to Same Day Surgery 2nd floor  [facility] ARRIVAL TIME 12:45 PM  Remember: Instructions that are not followed completely may result in serious medical risk, up to and including death, or upon the discretion of your surgeon and anesthesiologist your surgery may need to be rescheduled.    _x___ 1. Do not eat food or drink liquids after midnight. No gum chewing or hard candies.     _x ___ 2. No Alcohol for 24 hours before or after surgery.   _x  ___3. No Smoking for 24 prior to surgery.   ____  4. Bring all medications with you on the day of surgery if instructed.    __x__ 5. Notify your doctor if there is any change in your medical condition     (cold, fever, infections).     Do not wear jewelry, make-up, hairpins, clips or nail polish.  Do not wear lotions, powders, or perfumes. You may wear deodorant.  Do not shave 48 hours prior to surgery. Men may shave face and neck.  Do not bring valuables to the hospital.    [facility] is not responsible for any belongings or valuables.               Contacts, dentures or bridgework may not be worn into surgery.  Leave your suitcase in the car. After surgery it may be brought to your room.  For patients admitted to the hospital, discharge time is determined by your treatment team.   Patients discharged the day of surgery will not be allowed to drive home.    Please read over the following fact sheets that you were given:   [facility] Preparing for Surgery and or MRSA Information   _x___ Take these medicines the morning of surgery with A SIP OF WATER:    1. OMEPRAZOLE (PRILOSEC)  2.  3.  4.  5.  6.  ____Fleets enema or Magnesium Citrate as directed.   _x___ Use CHG Soap or sage wipes as directed on instruction sheet   _x___ Use inhalers on the day of surgery and bring to hospital day of surgery (Use Albuterol inhaler the morning of surgery and bring to hospital)  ____  Stop metformin 2 days prior to surgery    ____ Take 1/2 of usual insulin dose the night before surgery and none on the morning of surgery          _x___ Stop aspirin or coumadin, or plavix (NO ASPIRIN)  x__ Stop Anti-inflammatories such as Advil, Aleve, Ibuprofen, Motrin, Naproxen,          Naprosyn, Goodies powders or aspirin products. Ok to take Tylenol.   _x___ Stop supplements until after surgery.   (STOP  Vitamin B-6,  B-12 , Turmeric and Lactobacillus now)  ____ Bring C-Pap to the hospital.

## 2016-04-21 ENCOUNTER — Ambulatory Visit: Payer: BLUE CROSS/BLUE SHIELD | Admitting: Anesthesiology

## 2016-04-21 ENCOUNTER — Encounter: Payer: Self-pay | Admitting: Anesthesiology

## 2016-04-21 ENCOUNTER — Ambulatory Visit
Admission: RE | Admit: 2016-04-21 | Discharge: 2016-04-21 | Disposition: A | Discharge status: Home / Self Care | Payer: BLUE CROSS/BLUE SHIELD | Source: Ambulatory Visit | Attending: General Surgery | Admitting: General Surgery

## 2016-04-21 ENCOUNTER — Encounter
Admission: RE | Disposition: A | Discharge status: Home / Self Care | Payer: Self-pay | Source: Ambulatory Visit | Attending: General Surgery

## 2016-04-21 DIAGNOSIS — Z6834 Body mass index (BMI) 34.0-34.9, adult: Secondary | ICD-10-CM | POA: Insufficient documentation

## 2016-04-21 DIAGNOSIS — R590 Localized enlarged lymph nodes: Secondary | ICD-10-CM | POA: Diagnosis not present

## 2016-04-21 DIAGNOSIS — R5383 Other fatigue: Secondary | ICD-10-CM | POA: Insufficient documentation

## 2016-04-21 DIAGNOSIS — R634 Abnormal weight loss: Secondary | ICD-10-CM | POA: Insufficient documentation

## 2016-04-21 DIAGNOSIS — Z87891 Personal history of nicotine dependence: Secondary | ICD-10-CM | POA: Insufficient documentation

## 2016-04-21 DIAGNOSIS — E8809 Other disorders of plasma-protein metabolism, not elsewhere classified: Secondary | ICD-10-CM | POA: Insufficient documentation

## 2016-04-21 DIAGNOSIS — Z8572 Personal history of non-Hodgkin lymphomas: Secondary | ICD-10-CM | POA: Insufficient documentation

## 2016-04-21 DIAGNOSIS — E669 Obesity, unspecified: Secondary | ICD-10-CM | POA: Insufficient documentation

## 2016-04-21 DIAGNOSIS — Z79899 Other long term (current) drug therapy: Secondary | ICD-10-CM | POA: Insufficient documentation

## 2016-04-21 DIAGNOSIS — I129 Hypertensive chronic kidney disease with stage 1 through stage 4 chronic kidney disease, or unspecified chronic kidney disease: Secondary | ICD-10-CM | POA: Insufficient documentation

## 2016-04-21 DIAGNOSIS — K219 Gastro-esophageal reflux disease without esophagitis: Secondary | ICD-10-CM | POA: Insufficient documentation

## 2016-04-21 DIAGNOSIS — J329 Chronic sinusitis, unspecified: Secondary | ICD-10-CM | POA: Insufficient documentation

## 2016-04-21 DIAGNOSIS — Z880 Allergy status to penicillin: Secondary | ICD-10-CM | POA: Insufficient documentation

## 2016-04-21 DIAGNOSIS — F419 Anxiety disorder, unspecified: Secondary | ICD-10-CM | POA: Insufficient documentation

## 2016-04-21 DIAGNOSIS — Z9484 Stem cells transplant status: Secondary | ICD-10-CM | POA: Insufficient documentation

## 2016-04-21 DIAGNOSIS — Z90710 Acquired absence of both cervix and uterus: Secondary | ICD-10-CM | POA: Insufficient documentation

## 2016-04-21 DIAGNOSIS — R591 Generalized enlarged lymph nodes: Secondary | ICD-10-CM

## 2016-04-21 HISTORY — PX: INGUINAL LYMPH NODE BIOPSY: SHX5865

## 2016-04-21 SURGERY — BIOPSY, LYMPH NODE, INGUINAL, OPEN
Anesthesia: General | Laterality: Right

## 2016-04-21 MED ORDER — FAMOTIDINE 20 MG PO TABS: 20.0000 mg | ORAL_TABLET | Freq: Once | ORAL | Status: DC

## 2016-04-21 MED ORDER — BUPIVACAINE-EPINEPHRINE (PF) 0.5% -1:200000 IJ SOLN
INTRAMUSCULAR | Status: AC
  Filled 2016-04-21: qty 10

## 2016-04-21 MED ORDER — DEXAMETHASONE SODIUM PHOSPHATE 10 MG/ML IJ SOLN
INTRAMUSCULAR | Status: DC | PRN
  Administered 2016-04-21: 5 mg via INTRAVENOUS

## 2016-04-21 MED ORDER — OXYCODONE-ACETAMINOPHEN 5-325 MG PO TABS: 1.0000 | ORAL_TABLET | ORAL | 0 refills | Status: DC | PRN

## 2016-04-21 MED ORDER — MIDAZOLAM HCL 5 MG/5ML IJ SOLN
INTRAMUSCULAR | Status: DC | PRN
  Administered 2016-04-21: 2 mg via INTRAVENOUS

## 2016-04-21 MED ORDER — FENTANYL CITRATE (PF) 100 MCG/2ML IJ SOLN: 25.0000 ug | INTRAMUSCULAR | Status: DC | PRN

## 2016-04-21 MED ORDER — LIDOCAINE HCL (PF) 2 % IJ SOLN
INTRAMUSCULAR | Status: DC | PRN
  Administered 2016-04-21: 50 mg

## 2016-04-21 MED ORDER — ONDANSETRON HCL 4 MG/2ML IJ SOLN
INTRAMUSCULAR | Status: DC | PRN
  Administered 2016-04-21: 4 mg via INTRAVENOUS

## 2016-04-21 MED ORDER — PROPOFOL 10 MG/ML IV BOLUS
INTRAVENOUS | Status: DC | PRN
  Administered 2016-04-21: 200 mg via INTRAVENOUS

## 2016-04-21 MED ORDER — LACTATED RINGERS IV SOLN
INTRAVENOUS | Status: DC
  Administered 2016-04-21: 12:00:00 via INTRAVENOUS

## 2016-04-21 MED ORDER — ONDANSETRON HCL 4 MG/2ML IJ SOLN: 4.0000 mg | Freq: Once | INTRAMUSCULAR | Status: DC | PRN

## 2016-04-21 MED ORDER — GLYCOPYRROLATE 0.2 MG/ML IJ SOLN
INTRAMUSCULAR | Status: DC | PRN
  Administered 2016-04-21: 0.2 mg via INTRAVENOUS

## 2016-04-21 MED ORDER — BUPIVACAINE-EPINEPHRINE (PF) 0.5% -1:200000 IJ SOLN
INTRAMUSCULAR | Status: DC | PRN
  Administered 2016-04-21: 15 mL

## 2016-04-21 MED ORDER — FENTANYL CITRATE (PF) 100 MCG/2ML IJ SOLN
INTRAMUSCULAR | Status: DC | PRN
  Administered 2016-04-21 (×3): 50 ug via INTRAVENOUS

## 2016-04-21 SURGICAL SUPPLY — 27 items
BLADE SURG 15 STRL SS SAFETY (BLADE) ×3 IMPLANT
CANISTER SUCT 1200ML W/VALVE (MISCELLANEOUS) ×3 IMPLANT
CHLORAPREP W/TINT 26ML (MISCELLANEOUS) ×3 IMPLANT
CLOSURE WOUND 1/2 X4 (GAUZE/BANDAGES/DRESSINGS) ×1
CNTNR SPEC 2.5X3XGRAD LEK (MISCELLANEOUS) ×1
CONT SPEC 4OZ STER OR WHT (MISCELLANEOUS) ×2
CONTAINER SPEC 2.5X3XGRAD LEK (MISCELLANEOUS) ×1 IMPLANT
COVER PROBE FLX POLY STRL (MISCELLANEOUS) ×3 IMPLANT
DRAPE LAPAROTOMY 100X77 ABD (DRAPES) ×3 IMPLANT
DRESSING TELFA 4X3 1S ST N-ADH (GAUZE/BANDAGES/DRESSINGS) ×3 IMPLANT
DRSG TEGADERM 4X4.75 (GAUZE/BANDAGES/DRESSINGS) ×3 IMPLANT
ELECT REM PT RETURN 9FT ADLT (ELECTROSURGICAL) ×3
ELECTRODE REM PT RTRN 9FT ADLT (ELECTROSURGICAL) ×1 IMPLANT
GLOVE BIO SURGEON STRL SZ7.5 (GLOVE) ×3 IMPLANT
GLOVE INDICATOR 8.0 STRL GRN (GLOVE) ×3 IMPLANT
GOWN STRL REUS W/ TWL LRG LVL3 (GOWN DISPOSABLE) ×2 IMPLANT
GOWN STRL REUS W/TWL LRG LVL3 (GOWN DISPOSABLE) ×4
NDL SAFETY 25GX1.5 (NEEDLE) ×3 IMPLANT
PACK BASIN MINOR ARMC (MISCELLANEOUS) ×3 IMPLANT
STRIP CLOSURE SKIN 1/2X4 (GAUZE/BANDAGES/DRESSINGS) ×2 IMPLANT
SUT VIC AB 2-0 CT1 (SUTURE) ×3 IMPLANT
SUT VIC AB 3-0 SH 27 (SUTURE) ×2
SUT VIC AB 3-0 SH 27X BRD (SUTURE) ×1 IMPLANT
SUT VIC AB 4-0 FS2 27 (SUTURE) ×3 IMPLANT
SUT VICRYL+ 3-0 144IN (SUTURE) ×3 IMPLANT
SWABSTK COMLB BENZOIN TINCTURE (MISCELLANEOUS) ×3 IMPLANT
SYR CONTROL 10ML (SYRINGE) ×3 IMPLANT

## 2016-04-21 NOTE — H&P (Signed)
No change since yesterday.

## 2016-04-21 NOTE — Op Note (Signed)
Preoperative diagnosis: Lymphadenopathy.  Postoperative diagnosis: Same.  Operative procedure: Right inguinal node biopsy.  Operating surgeon: Lanney Gins, M.D.  Anesthesia: Gen. by LMA, Marcaine 0.5% with 1-200,000 units epinephrine, 15 mL local infiltration.  Estimated blood loss: Less than 5 mL.  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 Screening CT scan suggested a lymphadenopathy and PET scan showed increased uptake in the both the right inguinal and the iliac areas bilaterally. Ultrasound showed an enlarged node in the right inguinal area and was elected to proceed open biopsy.  Operative note: With the patient under adequate general anesthesia the area was prepped with ChloraPrep and draped. Ultrasound was used to confirm the location of the nonpalpable node. The area was marked and local anesthetic infiltrated for postoperative analgesia. A skin line incision in the right groin was carried aphthous consulting this tissue with hemostasis tube electrocautery. The enlarged lymph gland was removed with hemostasis achieved by 3-0 Vicryl ties and a single 3-0 Vicryl suture ligature. The wound was closed in layers with running 3-0 Vicryl suture to the adipose tissue and then a superficial layer of 3-0 Vicryl simple suture. The skin was closed with a running 4-0 Vicryl subcuticular suture. Benzoin, Steri-Strips, Telfa and Tegaderm dressing applied.  The patient tolerated the procedure well and was taken to the recovery room in stable condition.

## 2016-04-21 NOTE — Anesthesia Procedure Notes (Signed)
Procedure Name: LMA Insertion Performed by: Rolla Plate Pre-anesthesia Checklist: Patient identified, Patient being monitored, Timeout performed, Emergency Drugs available and Suction available Patient Re-evaluated:Patient Re-evaluated prior to inductionOxygen Delivery Method: Circle system utilized Preoxygenation: Pre-oxygenation with 100% oxygen Intubation Type: IV induction LMA: LMA inserted LMA Size: 3.5 Tube type: Oral Number of attempts: 1 Placement Confirmation: positive ETCO2 and breath sounds checked- equal and bilateral Tube secured with: Tape Dental Injury: Teeth and Oropharynx as per pre-operative assessment

## 2016-04-21 NOTE — Transfer of Care (Signed)
Immediate Anesthesia Transfer of Care Note  Patient: Bethany Kelly  Procedure(s) Performed: Procedure(s): INGUINAL LYMPH NODE BIOPSY (Right)  Patient Location: PACU  Anesthesia Type:General  Level of Consciousness: sedated  Airway & Oxygen Therapy: Patient Spontanous Breathing and Patient connected to face mask oxygen  Post-op Assessment: Report given to RN and Post -op Vital signs reviewed and stable  Post vital signs: Reviewed  Last Vitals:  Vitals:   04/21/16 1156 04/21/16 1414  BP: (!) 133/92 111/60  Pulse: 88 85  Resp: 20 19  Temp: 36.9 C 36.1 C    Last Pain:  Vitals:   04/21/16 1156  TempSrc: Oral  PainSc: 0-No pain      Patients Stated Pain Goal: 2 ([id number])  Complications: No apparent anesthesia complications

## 2016-04-21 NOTE — Discharge Instructions (Signed)

## 2016-04-21 NOTE — Anesthesia Preprocedure Evaluation (Signed)
Anesthesia Evaluation  Patient identified by MRN, date of birth, ID band Patient awake    Reviewed: Allergy & Precautions, NPO status , Patient's Chart, lab work & pertinent test results, reviewed documented beta blocker date and time   Airway Mallampati: III  TM Distance: >3 FB     Dental  (+) Chipped   Pulmonary former smoker,           Cardiovascular hypertension, Pt. on medications      Neuro/Psych Anxiety    GI/Hepatic GERD  Controlled,  Endo/Other    Renal/GU Renal InsufficiencyRenal disease     Musculoskeletal   Abdominal   Peds  Hematology  (+) anemia ,   Anesthesia Other Findings Lymphoma. Obese.  Reproductive/Obstetrics                             Anesthesia Physical Anesthesia Plan  ASA: III  Anesthesia Plan: General   Post-op Pain Management:    Induction: Intravenous  Airway Management Planned: LMA  Additional Equipment:   Intra-op Plan:   Post-operative Plan:   Informed Consent: I have reviewed the patients History and Physical, chart, labs and discussed the procedure including the risks, benefits and alternatives for the proposed anesthesia with the patient or authorized representative who has indicated his/her understanding and acceptance.     Plan Discussed with: CRNA  Anesthesia Plan Comments:         Anesthesia Quick Evaluation

## 2016-04-21 NOTE — Anesthesia Postprocedure Evaluation (Signed)
Anesthesia Post Note  Patient: Bethany Kelly  Procedure(s) Performed: Procedure(s) (LRB): INGUINAL LYMPH NODE BIOPSY (Right)  Patient location during evaluation: PACU Anesthesia Type: General Level of consciousness: awake and alert Pain management: pain level controlled Vital Signs Assessment: post-procedure vital signs reviewed and stable Respiratory status: spontaneous breathing, nonlabored ventilation, respiratory function stable and patient connected to nasal cannula oxygen Cardiovascular status: blood pressure returned to baseline and stable Postop Assessment: no signs of nausea or vomiting Anesthetic complications: no    Last Vitals:  Vitals:   04/21/16 1444 04/21/16 1458  BP: 133/88 131/78  Pulse: 86 77  Resp: 12 16  Temp: 36.4 C 36.5 C    Last Pain:  Vitals:   04/21/16 1458  TempSrc: Tympanic  PainSc: [place]

## 2016-04-25 ENCOUNTER — Telehealth: Payer: Self-pay | Admitting: Internal Medicine

## 2016-04-25 NOTE — Telephone Encounter (Signed)
She would like you to call to advise regarding bx done Friday. Can you please call: [phone].

## 2016-04-26 ENCOUNTER — Other Ambulatory Visit: Payer: Self-pay | Admitting: Internal Medicine

## 2016-04-26 NOTE — Telephone Encounter (Addendum)
Explained that pathology results are not yet available. Patient expresses that she "upset that the pathology is taking this long. I am very anxious and would like to know what is going on with my care."  Reassurance provided. Will update patient as results come available.  msg given to Dr. Rogue Bussing. He will contact Dr. Reuel Derby to determine when results will be available.

## 2016-04-26 NOTE — Telephone Encounter (Signed)
Left msg that call was being returned.

## 2016-04-28 ENCOUNTER — Ambulatory Visit (INDEPENDENT_AMBULATORY_CARE_PROVIDER_SITE_OTHER): Payer: BLUE CROSS/BLUE SHIELD | Admitting: *Deleted

## 2016-04-28 ENCOUNTER — Telehealth: Payer: Self-pay | Admitting: *Deleted

## 2016-04-28 ENCOUNTER — Encounter: Payer: Self-pay | Admitting: *Deleted

## 2016-04-28 DIAGNOSIS — C8335 Diffuse large B-cell lymphoma, lymph nodes of inguinal region and lower limb: Secondary | ICD-10-CM

## 2016-04-28 NOTE — Progress Notes (Signed)
Chart reviewed. Pathology still pending.

## 2016-04-28 NOTE — Patient Instructions (Signed)
return as scheduled.

## 2016-04-28 NOTE — Telephone Encounter (Signed)
Dr. Rogue Bussing, patient is inquiring about her pathology results.- not yet resulted

## 2016-04-28 NOTE — Progress Notes (Signed)
Patient came in today for a wound check.  The wound is clean, with no signs of infection noted. .Follow up as scheduled.  

## 2016-05-01 ENCOUNTER — Telehealth: Payer: Self-pay | Admitting: Internal Medicine

## 2016-05-01 ENCOUNTER — Telehealth: Payer: Self-pay | Admitting: General Surgery

## 2016-05-01 LAB — SURGICAL PATHOLOGY

## 2016-05-01 NOTE — Telephone Encounter (Signed)
Patient notified results were benign.  She reports minimal thickening along the incision. No bruising.  Follow-up as scheduled on October 12. We'll forward biopsy results to medical oncology for their assessment.

## 2016-05-01 NOTE — Telephone Encounter (Signed)
Spoke to pt re: results of negative LNBx. Plan CT chest/ab/pelvis in 3 months.  Heather- please move her labs/ scans/my visit to 3 months/ scan few days prior; cancel dec appt.

## 2016-05-02 ENCOUNTER — Other Ambulatory Visit: Payer: Self-pay | Admitting: *Deleted

## 2016-05-02 DIAGNOSIS — C8330 Diffuse large B-cell lymphoma, unspecified site: Secondary | ICD-10-CM

## 2016-05-04 ENCOUNTER — Ambulatory Visit (INDEPENDENT_AMBULATORY_CARE_PROVIDER_SITE_OTHER): Payer: BLUE CROSS/BLUE SHIELD | Admitting: General Surgery

## 2016-05-04 ENCOUNTER — Encounter: Payer: Self-pay | Admitting: General Surgery

## 2016-05-04 VITALS — BP 132/78 | HR 74 | Resp 12 | Ht 68.0 in | Wt 224.0 lb

## 2016-05-04 DIAGNOSIS — C8335 Diffuse large B-cell lymphoma, lymph nodes of inguinal region and lower limb: Secondary | ICD-10-CM

## 2016-05-04 NOTE — Patient Instructions (Signed)
The patient is aware to call back for any questions or concerns.  

## 2016-05-04 NOTE — Progress Notes (Signed)
Patient ID: Bethany Kelly, female   DOB: 18-Jun-1971, 45 y.o.   MRN: [id number]  Chief Complaint  Patient presents with  . Routine Post Op    HPI Bethany Kelly is a 45 y.o. female.  Here today for postoperative visit, right lymph node biopsy on 04-21-16, they state they are doing well. Denies any gastrointestinal issues, bowels are moving regular. She states the area is still a little tender.  HPI  Past Medical History:  Diagnosis Date  . Anemia   . Anxiety   . Chronic kidney disease    kidney stones  . Chronic sinusitis   . 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 Fatigue   . GERD (gastroesophageal reflux disease)   . Hypertension    past history of hypertension  . Hypoalbuminemia   . Personal history of chemotherapy June 2013 , and  March 2014  . Weight loss     Past Surgical History:  Procedure Laterality Date  . ABDOMINAL HYSTERECTOMY    . bladder tacking    . INGUINAL LYMPH NODE BIOPSY Right 04/21/2016    INGUINAL LYMPH NODE BIOPSY;  REACTIVE LYMPHADENOPATHY.  Robert Bellow, MD;  [facility] ORS;General;  Laterality: Right;  . 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 PORTACATH PLACEMENT  2013  . RECTAL PROLAPSE REPAIR    . TUBAL LIGATION      Family History  Problem Relation Age of Onset  . Adopted: Yes  . Family history unknown: Yes    Social History Social History  Substance Use Topics  . Smoking status: Former Smoker    Packs/day: 1.00    Types: Cigarettes    Quit date: 12/16/1998  . Smokeless tobacco: Never Used  . Alcohol use Yes     Comment: Occassional    Allergies  Allergen Reactions  . Penicillins Shortness Of Breath and Swelling    "swelling of throat."    Current Outpatient Prescriptions  Medication Sig Dispense Refill  . albuterol (PROVENTIL HFA;VENTOLIN HFA) 108 (90 Base) MCG/ACT inhaler Inhale 2 puffs into the lungs every 4 (four) hours as needed.     . citalopram (CELEXA) 10 MG tablet Take 1 tablet (10 mg total) by mouth daily. 30  tablet 6  . Cyanocobalamin (RA VITAMIN B-12 TR) 1000 MCG TBCR Take 1 tablet by mouth daily.     Marland Kitchen ibuprofen (ADVIL,MOTRIN) 600 MG tablet Take 600 mg by mouth every 6 (six) hours as needed.     Marland Kitchen LACTOBACILLUS RHAMNOSUS, GG, PO Take by mouth.    . loperamide (IMODIUM) 2 MG capsule Take 2 mg by mouth as needed.     . Multiple Vitamin (MULTI-VITAMINS) TABS Take 1 tablet by mouth daily.     Marland Kitchen omeprazole (PRILOSEC) 20 MG capsule Take 1 capsule (20 mg total) by mouth daily. 30 capsule 3  . ondansetron (ZOFRAN ODT) 4 MG disintegrating tablet Take 1 tablet (4 mg total) by mouth every 6 (six) hours as needed for nausea or vomiting. 30 tablet 6  . oxyCODONE-acetaminophen (ROXICET) 5-325 MG tablet Take 1-2 tablets by mouth every 4 (four) hours as needed for severe pain. 20 tablet 0  . pyridOXINE (VITAMIN B-6) 25 MG tablet Take 25 mg by mouth daily.     . tamsulosin (FLOMAX) 0.4 MG CAPS capsule Take 1 capsule (0.4 mg total) by mouth daily. (Patient taking differently: Take 0.4 mg by mouth as needed. ) 30 capsule 0  . triamcinolone ointment (KENALOG) 0.1 % Apply topically as  needed.     . Turmeric Curcumin 500 MG CAPS Take by mouth daily.      No current facility-administered medications for this visit.     Review of Systems Review of Systems  Constitutional: Negative.   Respiratory: Negative.   Cardiovascular: Negative.     Blood pressure 132/78, pulse 74, resp. rate 12, height 5\' 8"  (1.727 m), weight 224 lb (101.6 kg).  Physical Exam Physical Exam  Constitutional: She is oriented to person, place, and time. She appears well-developed and well-nourished.  Musculoskeletal:       Legs: Neurological: She is alert and oriented to person, place, and time.  Skin: Skin is warm and dry.  Psychiatric: Her behavior is normal.    Data Reviewed DIAGNOSIS:  A. LYMPH NODE, RIGHT INGUINAL:  - REACTIVE LYMPHADENOPATHY.  - NEGATIVE FOR MALIGNANCY.   Assessment    Doing well status post right inguinal  node excision.    Plan    Patient is scheduled for a follow-up CT with her medical oncologist in 3 months.    Follow up as needed. The patient is aware to call back for any questions or concerns.   This information has been scribed by Karie Fetch RN, BSN,BC.   Robert Bellow 05/04/2016, 9:35 PM

## 2016-05-23 ENCOUNTER — Inpatient Hospital Stay: Payer: BLUE CROSS/BLUE SHIELD

## 2016-07-04 ENCOUNTER — Encounter: Payer: Self-pay | Admitting: *Deleted

## 2016-07-04 ENCOUNTER — Inpatient Hospital Stay: Payer: BLUE CROSS/BLUE SHIELD

## 2016-07-04 ENCOUNTER — Inpatient Hospital Stay: Payer: BLUE CROSS/BLUE SHIELD | Attending: Internal Medicine

## 2016-07-04 DIAGNOSIS — Z95828 Presence of other vascular implants and grafts: Secondary | ICD-10-CM

## 2016-07-04 DIAGNOSIS — C8335 Diffuse large B-cell lymphoma, lymph nodes of inguinal region and lower limb: Secondary | ICD-10-CM | POA: Insufficient documentation

## 2016-07-04 DIAGNOSIS — C8330 Diffuse large B-cell lymphoma, unspecified site: Secondary | ICD-10-CM

## 2016-07-04 LAB — COMPREHENSIVE METABOLIC PANEL
ALT: 30 U/L (ref 14–54)
AST: 28 U/L (ref 15–41)
Albumin: 3.9 g/dL (ref 3.5–5.0)
Alkaline Phosphatase: 58 U/L (ref 38–126)
Anion gap: 6 (ref 5–15)
BUN: 10 mg/dL (ref 6–20)
CO2: 27 mmol/L (ref 22–32)
Calcium: 8.6 mg/dL — ABNORMAL LOW (ref 8.9–10.3)
Chloride: 106 mmol/L (ref 101–111)
Creatinine, Ser: 0.87 mg/dL (ref 0.44–1.00)
GFR calc Af Amer: >60 mL/min (ref >60)
GFR calc non Af Amer: >60 mL/min (ref >60)
Glucose, Bld: 128 mg/dL — ABNORMAL HIGH (ref 65–99)
Potassium: 4 mmol/L (ref 3.5–5.1)
Sodium: 139 mmol/L (ref 135–145)
Total Bilirubin: 0.6 mg/dL (ref 0.3–1.2)
Total Protein: 6.9 g/dL (ref 6.5–8.1)

## 2016-07-04 LAB — CBC WITH DIFFERENTIAL/PLATELET
Basophils Absolute: 0.1 10*3/uL (ref 0–0.1)
Basophils Relative: 1 %
Eosinophils Absolute: 0.1 10*3/uL (ref 0–0.7)
Eosinophils Relative: 1 %
HCT: 38.1 % (ref 35.0–47.0)
Hemoglobin: 13 g/dL (ref 12.0–16.0)
Lymphocytes Relative: 33 %
Lymphs Abs: 2 10*3/uL (ref 1.0–3.6)
MCH: 30.4 pg (ref 26.0–34.0)
MCHC: 34.2 g/dL (ref 32.0–36.0)
MCV: 88.7 fL (ref 80.0–100.0)
Monocytes Absolute: 0.3 10*3/uL (ref 0.2–0.9)
Monocytes Relative: 5 %
Neutro Abs: 3.6 10*3/uL (ref 1.4–6.5)
Neutrophils Relative %: 60 %
Platelets: 216 10*3/uL (ref 150–440)
RBC: 4.29 MIL/uL (ref 3.80–5.20)
RDW: 13 % (ref 11.5–14.5)
WBC: 6 10*3/uL (ref 3.6–11.0)

## 2016-07-04 LAB — LACTATE DEHYDROGENASE: LDH: 166 U/L (ref 98–192)

## 2016-07-04 MED ORDER — HEPARIN SOD (PORK) LOCK FLUSH 100 UNIT/ML IV SOLN
500.0000 [IU] | Freq: Once | INTRAVENOUS | Status: AC
  Administered 2016-07-04: 500 [IU] via INTRAVENOUS

## 2016-07-04 MED ORDER — SODIUM CHLORIDE 0.9% FLUSH
10.0000 mL | INTRAVENOUS | Status: DC | PRN
  Administered 2016-07-04: 10 mL via INTRAVENOUS
  Filled 2016-07-04: qty 10

## 2016-07-04 MED ORDER — HEPARIN SOD (PORK) LOCK FLUSH 100 UNIT/ML IV SOLN: 500.0000 [IU] | Freq: Once | INTRAVENOUS | Status: AC

## 2016-07-04 MED ORDER — SODIUM CHLORIDE 0.9% FLUSH
10.0000 mL | INTRAVENOUS | Status: AC | PRN
  Filled 2016-07-04: qty 10

## 2016-07-07 ENCOUNTER — Ambulatory Visit: Payer: BLUE CROSS/BLUE SHIELD | Admitting: Internal Medicine

## 2016-07-07 ENCOUNTER — Other Ambulatory Visit: Payer: BLUE CROSS/BLUE SHIELD

## 2016-07-31 ENCOUNTER — Ambulatory Visit: Payer: BLUE CROSS/BLUE SHIELD

## 2016-08-01 ENCOUNTER — Ambulatory Visit: Payer: BLUE CROSS/BLUE SHIELD

## 2016-08-02 ENCOUNTER — Ambulatory Visit: Payer: BLUE CROSS/BLUE SHIELD | Admitting: Internal Medicine

## 2016-08-02 ENCOUNTER — Other Ambulatory Visit: Payer: BLUE CROSS/BLUE SHIELD

## 2016-08-03 ENCOUNTER — Ambulatory Visit
Admission: RE | Admit: 2016-08-03 | Discharge: 2016-08-03 | Disposition: A | Discharge status: Home / Self Care | Payer: BLUE CROSS/BLUE SHIELD | Source: Ambulatory Visit | Attending: Internal Medicine | Admitting: Internal Medicine

## 2016-08-03 DIAGNOSIS — C8330 Diffuse large B-cell lymphoma, unspecified site: Secondary | ICD-10-CM | POA: Insufficient documentation

## 2016-08-03 MED ORDER — IOPAMIDOL (ISOVUE-300) INJECTION 61%
100.0000 mL | Freq: Once | INTRAVENOUS | Status: AC | PRN
  Administered 2016-08-03: 100 mL via INTRAVENOUS

## 2016-08-07 ENCOUNTER — Inpatient Hospital Stay: Payer: BLUE CROSS/BLUE SHIELD | Attending: Internal Medicine | Admitting: Internal Medicine

## 2016-08-07 ENCOUNTER — Inpatient Hospital Stay: Payer: BLUE CROSS/BLUE SHIELD

## 2016-08-07 VITALS — BP 111/74 | HR 85 | Temp 98.0°F | Wt 228.0 lb

## 2016-08-07 DIAGNOSIS — E8809 Other disorders of plasma-protein metabolism, not elsewhere classified: Secondary | ICD-10-CM | POA: Insufficient documentation

## 2016-08-07 DIAGNOSIS — I10 Essential (primary) hypertension: Secondary | ICD-10-CM | POA: Diagnosis not present

## 2016-08-07 DIAGNOSIS — Z9484 Stem cells transplant status: Secondary | ICD-10-CM | POA: Diagnosis not present

## 2016-08-07 DIAGNOSIS — D649 Anemia, unspecified: Secondary | ICD-10-CM | POA: Insufficient documentation

## 2016-08-07 DIAGNOSIS — Z9221 Personal history of antineoplastic chemotherapy: Secondary | ICD-10-CM | POA: Diagnosis not present

## 2016-08-07 DIAGNOSIS — C8330 Diffuse large B-cell lymphoma, unspecified site: Secondary | ICD-10-CM

## 2016-08-07 DIAGNOSIS — F419 Anxiety disorder, unspecified: Secondary | ICD-10-CM | POA: Diagnosis not present

## 2016-08-07 DIAGNOSIS — Z8572 Personal history of non-Hodgkin lymphomas: Secondary | ICD-10-CM | POA: Diagnosis present

## 2016-08-07 DIAGNOSIS — R5383 Other fatigue: Secondary | ICD-10-CM | POA: Insufficient documentation

## 2016-08-07 DIAGNOSIS — G4700 Insomnia, unspecified: Secondary | ICD-10-CM | POA: Insufficient documentation

## 2016-08-07 DIAGNOSIS — Z79899 Other long term (current) drug therapy: Secondary | ICD-10-CM | POA: Diagnosis not present

## 2016-08-07 DIAGNOSIS — K219 Gastro-esophageal reflux disease without esophagitis: Secondary | ICD-10-CM | POA: Diagnosis not present

## 2016-08-07 DIAGNOSIS — Z87891 Personal history of nicotine dependence: Secondary | ICD-10-CM | POA: Diagnosis not present

## 2016-08-07 LAB — CBC WITH DIFFERENTIAL/PLATELET
Basophils Absolute: 0 10*3/uL (ref 0–0.1)
Basophils Relative: 0 %
Eosinophils Absolute: 0.1 10*3/uL (ref 0–0.7)
Eosinophils Relative: 1 %
HCT: 39.6 % (ref 35.0–47.0)
Hemoglobin: 13.5 g/dL (ref 12.0–16.0)
Lymphocytes Relative: 28 %
Lymphs Abs: 1.8 10*3/uL (ref 1.0–3.6)
MCH: 30.3 pg (ref 26.0–34.0)
MCHC: 34.1 g/dL (ref 32.0–36.0)
MCV: 88.8 fL (ref 80.0–100.0)
Monocytes Absolute: 0.3 10*3/uL (ref 0.2–0.9)
Monocytes Relative: 5 %
Neutro Abs: 4.1 10*3/uL (ref 1.4–6.5)
Neutrophils Relative %: 66 %
Platelets: 212 10*3/uL (ref 150–440)
RBC: 4.45 MIL/uL (ref 3.80–5.20)
RDW: 12.7 % (ref 11.5–14.5)
WBC: 6.4 10*3/uL (ref 3.6–11.0)

## 2016-08-07 LAB — COMPREHENSIVE METABOLIC PANEL
ALT: 34 U/L (ref 14–54)
AST: 40 U/L (ref 15–41)
Albumin: 4.1 g/dL (ref 3.5–5.0)
Alkaline Phosphatase: 63 U/L (ref 38–126)
Anion gap: 6 (ref 5–15)
BUN: 12 mg/dL (ref 6–20)
CO2: 28 mmol/L (ref 22–32)
Calcium: 9 mg/dL (ref 8.9–10.3)
Chloride: 104 mmol/L (ref 101–111)
Creatinine, Ser: 0.96 mg/dL (ref 0.44–1.00)
GFR calc Af Amer: >60 mL/min (ref >60)
GFR calc non Af Amer: >60 mL/min (ref >60)
Glucose, Bld: 110 mg/dL — ABNORMAL HIGH (ref 65–99)
Potassium: 4.1 mmol/L (ref 3.5–5.1)
Sodium: 138 mmol/L (ref 135–145)
Total Bilirubin: 0.9 mg/dL (ref 0.3–1.2)
Total Protein: 7.1 g/dL (ref 6.5–8.1)

## 2016-08-07 LAB — LACTATE DEHYDROGENASE: LDH: 177 U/L (ref 98–192)

## 2016-08-07 NOTE — Assessment & Plan Note (Addendum)
#   History of diffuse large B-cell lymphoma status post autologous transplant 2013;  JAN 2018- CT C/A/P- NED; except for subcm left pelvic LN; Sep 2017- Right inguinal LN s/p excision- NEG for malignancy-reactive lymph node. Labs- N. no evidence of recurrence.  # Insomnia-recommend melatonin.   # follow up CT A/P in 6 months; labs and M.D.  # cont Port flush every 8 weeks.   # I reviewed the blood work- with the patient in detail; also reviewed the imaging independently [as summarized above]; and with the patient in detail.   # 25 minutes face-to-face with the patient discussing the above plan of care; more than 50% of time spent on prognosis/ natural history; counseling and coordination.

## 2016-08-07 NOTE — Progress Notes (Signed)
[place] OFFICE PROGRESS NOTE  Patient Care Team: No Pcp Per Patient as PCP - General (General Practice) Cammie Sickle, MD as Consulting Physician (Internal Medicine) Robert Bellow, MD (General Surgery)  No matching staging information was found for the patient.   Oncology History   Chief Complaint/Diagnosis:   # 2013-  admitted with hypercalcemia and abnormal CT scan of the abdomen  2. 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 Patient was started on RCHOP chemotherapy June of 2013 4. High-dose methotrexate in alternate cycles starting from July 29. 5.last dose of all chemotherapy with R. CHOP (April 29, 2012) patient received total 6 cycles of chemotherapy and tolerated treatment very well without any significant side effect 6.and underwent high dose chemotherapy and stem cell support High-dose chemotherapy with BEAM regimen.  (March 24: 014  # SEP 13th 2017- ~1.2cm R Ingiunal LN; 74mm ex Iliac LN ? 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  In the interim patient denies any new lumps or bumps. Admits to insomnia. Patient denies any weight loss. Denies any nausea vomiting denies any unusual headaches.  No night sweats or weight loss or any numbness  REVIEW OF SYSTEMS:  A  complete 10 point review of system is done which is negative except mentioned above/history of present illness.   PAST MEDICAL HISTORY :  Past Medical History:  Diagnosis Date  . Anemia   . Anxiety   . Chronic kidney disease    kidney stones  . Chronic sinusitis   . Diffuse large B cell lymphoma ([place]) 2013  . Fatigue   . GERD (gastroesophageal reflux disease)   . Hypertension    past history of hypertension  . Hypoalbuminemia   . Personal history of chemotherapy June 2013 , and  March 2014  . Weight loss     PAST SURGICAL HISTORY :   Past Surgical History:  Procedure Laterality Date  . ABDOMINAL HYSTERECTOMY    . bladder tacking    . INGUINAL LYMPH NODE BIOPSY Right 04/21/2016    INGUINAL LYMPH NODE BIOPSY;  REACTIVE LYMPHADENOPATHY.  Robert Bellow, MD;  [facility] ORS;General;  Laterality: Right;  . 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 PORTACATH PLACEMENT  2013  . RECTAL PROLAPSE REPAIR    . TUBAL LIGATION      FAMILY HISTORY :   Family History  Problem Relation Age of Onset  . Adopted: Yes  . Family history unknown: Yes    SOCIAL HISTORY:   Social History  Substance Use Topics  . Smoking status: Former Smoker    Packs/day: 1.00    Types: Cigarettes    Quit date: 12/16/1998  . Smokeless tobacco: Never Used  . Alcohol use Yes     Comment: Occassional  ALLERGIES:  is allergic to penicillins.  MEDICATIONS:  Current Outpatient Prescriptions  Medication Sig Dispense Refill  . albuterol (PROVENTIL HFA;VENTOLIN HFA) 108 (90 Base) MCG/ACT inhaler Inhale 2 puffs into the lungs every 4 (four) hours as needed.     . citalopram (CELEXA) 10 MG tablet Take 1 tablet (10 mg total) by mouth daily. 30 tablet 6  . Cyanocobalamin (RA VITAMIN B-12 TR) 1000 MCG TBCR Take 1 tablet by mouth daily.     Marland Kitchen ibuprofen (ADVIL,MOTRIN) 600 MG tablet Take 600 mg by mouth every 6 (six) hours as needed.     Marland Kitchen LACTOBACILLUS RHAMNOSUS, GG, PO Take by mouth.    . Multiple Vitamin  (MULTI-VITAMINS) TABS Take 1 tablet by mouth daily.     Marland Kitchen omeprazole (PRILOSEC) 20 MG capsule Take 1 capsule (20 mg total) by mouth daily. 30 capsule 3  . ondansetron (ZOFRAN ODT) 4 MG disintegrating tablet Take 1 tablet (4 mg total) by mouth every 6 (six) hours as needed for nausea or vomiting. 30 tablet 6  . pyridOXINE (VITAMIN B-6) 25 MG tablet Take 25 mg by mouth daily.     . Turmeric Curcumin 500 MG CAPS Take by mouth daily.     Marland Kitchen loperamide (IMODIUM) 2 MG capsule Take 2 mg by mouth as needed.     . tamsulosin (FLOMAX) 0.4 MG CAPS capsule Take 1 capsule (0.4 mg total) by mouth daily. (Patient not taking: Reported on 08/07/2016) 30 capsule 0  . triamcinolone ointment (KENALOG) 0.1 % Apply topically as needed.      No current facility-administered medications for this visit.    Facility-Administered Medications Ordered in Other Visits  Medication Dose Route Frequency Provider Last Rate Last Dose  . sodium chloride flush (NS) 0.9 % injection 10 mL  10 mL Intravenous PRN Cammie Sickle, MD        PHYSICAL EXAMINATION: ECOG PERFORMANCE STATUS: 0 - Asymptomatic  BP 111/74 (BP Location: Left Arm, Patient Position: Sitting)   Pulse 85   Temp 98 F (36.7 C) (Oral)   Wt 228 lb (103.4 kg)   BMI 34.67 kg/m   Filed Weights   08/07/16 1015  Weight: 228 lb (103.4 kg)    GENERAL: Well-nourished well-developed; Alert, no distress and comfortable.   Alone. EYES: no pallor or icterus OROPHARYNX: no thrush or ulceration; good dentition  NECK: supple, no masses felt LYMPH:  no palpable lymphadenopathy in the cervical, axillary or inguinal regions LUNGS: clear to auscultation and  No wheeze or crackles HEART/CVS: regular rate & rhythm and no murmurs; No lower extremity edema ABDOMEN:abdomen soft, non-tender and normal bowel sounds Musculoskeletal:no cyanosis of digits and no clubbing  PSYCH: alert & oriented x 3 with fluent speech NEURO: no focal motor/sensory deficits SKIN:  No rash.    LABORATORY DATA:  I have reviewed the data as listed    Component Value Date/Time   NA 138 08/07/2016 0951   NA 141 08/19/2014 1038   K 4.1 08/07/2016 0951   K 4.3 08/19/2014 1038   CL 104 08/07/2016 0951   CL 103 08/19/2014 1038   CO2 28 08/07/2016 0951   CO2 29 08/19/2014 1038   GLUCOSE 110 (H) 08/07/2016 0951   GLUCOSE 103 (H) 08/19/2014 1038   BUN 12 08/07/2016 0951   BUN 9 08/19/2014 1038   CREATININE 0.96 08/07/2016 0951   CREATININE 1.07 08/19/2014 1038   CALCIUM 9.0 08/07/2016 0951   CALCIUM 8.6 08/19/2014 1038   PROT 7.1 08/07/2016 0951  PROT 7.0 08/19/2014 1038   ALBUMIN 4.1 08/07/2016 0951   ALBUMIN 3.7 08/19/2014 1038   AST 40 08/07/2016 0951   AST 18 08/19/2014 1038   ALT 34 08/07/2016 0951   ALT 36 08/19/2014 1038   ALKPHOS 63 08/07/2016 0951   ALKPHOS 81 08/19/2014 1038   BILITOT 0.9 08/07/2016 0951   BILITOT 0.5 08/19/2014 1038   GFRNONAA >60 08/07/2016 0951   GFRNONAA 59 (L) 08/19/2014 1038   GFRNONAA >60 03/16/2014 0911   GFRAA >60 08/07/2016 0951   GFRAA >60 08/19/2014 1038   GFRAA >60 03/16/2014 0911    No results found for: SPEP, UPEP  Lab Results  Component Value Date   WBC 6.4 08/07/2016   NEUTROABS 4.1 08/07/2016   HGB 13.5 08/07/2016   HCT 39.6 08/07/2016   MCV 88.8 08/07/2016   PLT 212 08/07/2016      Chemistry      Component Value Date/Time   NA 138 08/07/2016 0951   NA 141 08/19/2014 1038   K 4.1 08/07/2016 0951   K 4.3 08/19/2014 1038   CL 104 08/07/2016 0951   CL 103 08/19/2014 1038   CO2 28 08/07/2016 0951   CO2 29 08/19/2014 1038   BUN 12 08/07/2016 0951   BUN 9 08/19/2014 1038   CREATININE 0.96 08/07/2016 0951   CREATININE 1.07 08/19/2014 1038      Component Value Date/Time   CALCIUM 9.0 08/07/2016 0951   CALCIUM 8.6 08/19/2014 1038   ALKPHOS 63 08/07/2016 0951   ALKPHOS 81 08/19/2014 1038   AST 40 08/07/2016 0951   AST 18 08/19/2014 1038   ALT 34 08/07/2016 0951   ALT 36 08/19/2014 1038   BILITOT 0.9  08/07/2016 0951   BILITOT 0.5 08/19/2014 1038     IMPRESSION: Chest Impression:  1. No evidence of lymphoma in thorax.  Abdomen / Pelvis Impression:  1. Stable LEFT iliac lymph nodes. Excision of RIGHT inguinal lymph node. No new lymphadenopathy. 2. Normal size spleen.   Electronically Signed   By: Suzy Bouchard M.D.   On: 08/03/2016 14:42  RADIOGRAPHIC STUDIES: I have personally reviewed the radiological images as listed and agreed with the findings in the report. No results found.   ASSESSMENT & PLAN:  Malignant lymphoma, large cell, diffuse ([place]) # History of diffuse large B-cell lymphoma status post autologous transplant 2013;  JAN 2018- CT C/A/P- NED; except for subcm left pelvic LN; Sep 2017- Right inguinal LN s/p excision- NEG for malignancy-reactive lymph node. Labs- N. no evidence of recurrence.  # Insomnia-recommend melatonin.   # follow up CT A/P in 6 months; labs and M.D.  # cont Port flush every 8 weeks.   # I reviewed the blood work- with the patient in detail; also reviewed the imaging independently [as summarized above]; and with the patient in detail.   # 25 minutes face-to-face with the patient discussing the above plan of care; more than 50% of time spent on prognosis/ natural history; counseling and coordination.    Orders Placed This Encounter  Procedures  . CT ABDOMEN PELVIS W CONTRAST    Standing Status:   Future    Standing Expiration Date:   11/06/2017    Order Specific Question:   Reason for Exam (SYMPTOM  OR DIAGNOSIS REQUIRED)    Answer:   lymphoma    Order Specific Question:   Is the patient pregnant?    Answer:   No    Order Specific Question:   Preferred imaging  location?    Answer:   [facility]  . CBC with Differential/Platelet    Standing Status:   Future    Standing Expiration Date:   08/07/2017  . Comprehensive metabolic panel    Standing Status:   Future    Standing Expiration Date:   08/07/2017  . Lactate  dehydrogenase    Standing Status:   Future    Standing Expiration Date:   08/07/2017   All questions were answered. The patient knows to call the clinic with any problems, questions or concerns.      Cammie Sickle, MD 08/07/2016 10:49 AM

## 2016-08-07 NOTE — Progress Notes (Signed)
Patient here today for follow up.   

## 2016-08-28 ENCOUNTER — Other Ambulatory Visit: Payer: Self-pay | Admitting: Oncology

## 2016-10-05 ENCOUNTER — Other Ambulatory Visit: Payer: BLUE CROSS/BLUE SHIELD

## 2016-10-05 ENCOUNTER — Ambulatory Visit: Payer: BLUE CROSS/BLUE SHIELD | Admitting: Internal Medicine

## 2016-10-24 ENCOUNTER — Inpatient Hospital Stay: Payer: BLUE CROSS/BLUE SHIELD | Attending: Internal Medicine

## 2016-10-24 DIAGNOSIS — Z8572 Personal history of non-Hodgkin lymphomas: Secondary | ICD-10-CM | POA: Insufficient documentation

## 2016-10-24 DIAGNOSIS — Z452 Encounter for adjustment and management of vascular access device: Secondary | ICD-10-CM | POA: Insufficient documentation

## 2016-10-24 DIAGNOSIS — Z95828 Presence of other vascular implants and grafts: Secondary | ICD-10-CM

## 2016-10-24 MED ORDER — HEPARIN SOD (PORK) LOCK FLUSH 100 UNIT/ML IV SOLN
500.0000 [IU] | Freq: Once | INTRAVENOUS | Status: AC
  Administered 2016-10-24: 500 [IU] via INTRAVENOUS

## 2016-10-24 MED ORDER — SODIUM CHLORIDE 0.9% FLUSH
10.0000 mL | INTRAVENOUS | Status: DC | PRN
  Administered 2016-10-24: 10 mL via INTRAVENOUS
  Filled 2016-10-24: qty 10

## 2016-12-08 ENCOUNTER — Other Ambulatory Visit: Payer: Self-pay | Admitting: Internal Medicine

## 2017-02-02 ENCOUNTER — Ambulatory Visit
Admission: RE | Admit: 2017-02-02 | Discharge: 2017-02-02 | Disposition: A | Discharge status: Home / Self Care | Payer: BLUE CROSS/BLUE SHIELD | Source: Ambulatory Visit | Attending: Internal Medicine | Admitting: Internal Medicine

## 2017-02-02 DIAGNOSIS — R599 Enlarged lymph nodes, unspecified: Secondary | ICD-10-CM | POA: Insufficient documentation

## 2017-02-02 DIAGNOSIS — I700 Atherosclerosis of aorta: Secondary | ICD-10-CM | POA: Insufficient documentation

## 2017-02-02 DIAGNOSIS — K760 Fatty (change of) liver, not elsewhere classified: Secondary | ICD-10-CM | POA: Insufficient documentation

## 2017-02-02 DIAGNOSIS — N200 Calculus of kidney: Secondary | ICD-10-CM | POA: Diagnosis not present

## 2017-02-02 DIAGNOSIS — C8330 Diffuse large B-cell lymphoma, unspecified site: Secondary | ICD-10-CM | POA: Diagnosis present

## 2017-02-02 DIAGNOSIS — K8020 Calculus of gallbladder without cholecystitis without obstruction: Secondary | ICD-10-CM | POA: Diagnosis not present

## 2017-02-02 MED ORDER — IOPAMIDOL (ISOVUE-300) INJECTION 61%
100.0000 mL | Freq: Once | INTRAVENOUS | Status: AC | PRN
  Administered 2017-02-02: 100 mL via INTRAVENOUS

## 2017-02-05 ENCOUNTER — Inpatient Hospital Stay: Payer: BLUE CROSS/BLUE SHIELD

## 2017-02-05 ENCOUNTER — Inpatient Hospital Stay: Payer: BLUE CROSS/BLUE SHIELD | Attending: Internal Medicine | Admitting: Internal Medicine

## 2017-02-05 VITALS — BP 114/81 | HR 96 | Temp 99.5°F | Resp 18 | Wt 221.4 lb

## 2017-02-05 DIAGNOSIS — Z9484 Stem cells transplant status: Secondary | ICD-10-CM | POA: Insufficient documentation

## 2017-02-05 DIAGNOSIS — K219 Gastro-esophageal reflux disease without esophagitis: Secondary | ICD-10-CM | POA: Diagnosis not present

## 2017-02-05 DIAGNOSIS — C8330 Diffuse large B-cell lymphoma, unspecified site: Secondary | ICD-10-CM

## 2017-02-05 DIAGNOSIS — K8020 Calculus of gallbladder without cholecystitis without obstruction: Secondary | ICD-10-CM | POA: Insufficient documentation

## 2017-02-05 DIAGNOSIS — E8809 Other disorders of plasma-protein metabolism, not elsewhere classified: Secondary | ICD-10-CM

## 2017-02-05 DIAGNOSIS — D649 Anemia, unspecified: Secondary | ICD-10-CM | POA: Diagnosis not present

## 2017-02-05 DIAGNOSIS — Z8572 Personal history of non-Hodgkin lymphomas: Secondary | ICD-10-CM | POA: Diagnosis not present

## 2017-02-05 DIAGNOSIS — Z79899 Other long term (current) drug therapy: Secondary | ICD-10-CM

## 2017-02-05 DIAGNOSIS — K760 Fatty (change of) liver, not elsewhere classified: Secondary | ICD-10-CM | POA: Insufficient documentation

## 2017-02-05 DIAGNOSIS — J328 Other chronic sinusitis: Secondary | ICD-10-CM | POA: Diagnosis not present

## 2017-02-05 DIAGNOSIS — M549 Dorsalgia, unspecified: Secondary | ICD-10-CM | POA: Diagnosis not present

## 2017-02-05 DIAGNOSIS — Z87442 Personal history of urinary calculi: Secondary | ICD-10-CM

## 2017-02-05 DIAGNOSIS — N189 Chronic kidney disease, unspecified: Secondary | ICD-10-CM | POA: Diagnosis not present

## 2017-02-05 DIAGNOSIS — Z9221 Personal history of antineoplastic chemotherapy: Secondary | ICD-10-CM

## 2017-02-05 DIAGNOSIS — I129 Hypertensive chronic kidney disease with stage 1 through stage 4 chronic kidney disease, or unspecified chronic kidney disease: Secondary | ICD-10-CM | POA: Diagnosis not present

## 2017-02-05 DIAGNOSIS — F419 Anxiety disorder, unspecified: Secondary | ICD-10-CM | POA: Diagnosis not present

## 2017-02-05 DIAGNOSIS — Z87891 Personal history of nicotine dependence: Secondary | ICD-10-CM | POA: Insufficient documentation

## 2017-02-05 LAB — COMPREHENSIVE METABOLIC PANEL
ALT: 41 U/L (ref 14–54)
AST: 34 U/L (ref 15–41)
Albumin: 4.1 g/dL (ref 3.5–5.0)
Alkaline Phosphatase: 67 U/L (ref 38–126)
Anion gap: 6 (ref 5–15)
BUN: 10 mg/dL (ref 6–20)
CO2: 27 mmol/L (ref 22–32)
Calcium: 9 mg/dL (ref 8.9–10.3)
Chloride: 104 mmol/L (ref 101–111)
Creatinine, Ser: 1 mg/dL (ref 0.44–1.00)
GFR calc Af Amer: >60 mL/min (ref >60)
GFR calc non Af Amer: >60 mL/min (ref >60)
Glucose, Bld: 134 mg/dL — ABNORMAL HIGH (ref 65–99)
Potassium: 4 mmol/L (ref 3.5–5.1)
Sodium: 137 mmol/L (ref 135–145)
Total Bilirubin: 0.8 mg/dL (ref 0.3–1.2)
Total Protein: 7.3 g/dL (ref 6.5–8.1)

## 2017-02-05 LAB — CBC WITH DIFFERENTIAL/PLATELET
Basophils Absolute: 0 10*3/uL (ref 0–0.1)
Basophils Relative: 0 %
Eosinophils Absolute: 0.1 10*3/uL (ref 0–0.7)
Eosinophils Relative: 1 %
HCT: 38.3 % (ref 35.0–47.0)
Hemoglobin: 13.5 g/dL (ref 12.0–16.0)
Lymphocytes Relative: 15 %
Lymphs Abs: 1.3 10*3/uL (ref 1.0–3.6)
MCH: 32.1 pg (ref 26.0–34.0)
MCHC: 35.4 g/dL (ref 32.0–36.0)
MCV: 90.8 fL (ref 80.0–100.0)
Monocytes Absolute: 0.6 10*3/uL (ref 0.2–0.9)
Monocytes Relative: 6 %
Neutro Abs: 6.9 10*3/uL — ABNORMAL HIGH (ref 1.4–6.5)
Neutrophils Relative %: 78 %
Platelets: 183 10*3/uL (ref 150–440)
RBC: 4.22 MIL/uL (ref 3.80–5.20)
RDW: 13.6 % (ref 11.5–14.5)
WBC: 8.9 10*3/uL (ref 3.6–11.0)

## 2017-02-05 LAB — LACTATE DEHYDROGENASE: LDH: 167 U/L (ref 98–192)

## 2017-02-05 MED ORDER — HEPARIN SOD (PORK) LOCK FLUSH 100 UNIT/ML IV SOLN
500.0000 [IU] | Freq: Once | INTRAVENOUS | Status: AC
  Administered 2017-02-05: 500 [IU] via INTRAVENOUS

## 2017-02-05 MED ORDER — SODIUM CHLORIDE 0.9% FLUSH
10.0000 mL | INTRAVENOUS | Status: DC | PRN
  Administered 2017-02-05: 10 mL via INTRAVENOUS
  Filled 2017-02-05: qty 10

## 2017-02-05 NOTE — Progress Notes (Signed)
Patient would like discuss feeling fatigued and diffuse bone pain that is worse in lower back.  Has a lump on right side neck for a few months.

## 2017-02-05 NOTE — Assessment & Plan Note (Addendum)
#    diffuse large B-cell lymphoma status post autologous transplant 2013;  July 10 th 2018- CT C/A/P- NED; except for STABLE subcm left pelvic LN; Sep 2017- Right inguinal LN s/p excision- NEG for malignancy-reactive lymph node. Labs- N.   # Given the CT scan of the chest abdomen pelvis is negative for any recurrence; the worsening back pain is not explainable based on current imaging. A PET would be reasonable; however I think MRI would be a better  to look at the spinal cord  # severe Low back pain- 3 months.. On NSAIDs.   # cont Port flush every 2 months; see me/no labs.   # I reviewed the blood work- with the patient in detail; also reviewed the imaging independently [as summarized above]; and with the patient in detail.

## 2017-02-05 NOTE — Progress Notes (Signed)
[place] OFFICE PROGRESS NOTE  Patient Care Team: Patient, No Pcp Per as PCP - General ([facility]) Cammie Sickle, MD as Consulting Physician (Internal Medicine) Bary Castilla Forest Gleason, MD (General Surgery)  Cancer Staging No matching staging information was found for the patient.   Oncology History   Chief Complaint/Diagnosis:   # 2013-  admitted with hypercalcemia and abnormal CT scan of the abdomen  2. 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 Patient was started on RCHOP chemotherapy June of 2013 4. High-dose methotrexate in alternate cycles starting from July 29. 5.last dose of all chemotherapy with R. CHOP (April 29, 2012) patient received total 6 cycles of chemotherapy and tolerated treatment very well without any significant side effect 6.and underwent high dose chemotherapy and stem cell support High-dose chemotherapy with BEAM regimen.  (March 24: 014  # SEP 13th 2017- ~1.2cm R Ingiunal LN; 63mm ex Iliac LN ? Recurrence; RIGHT INGUINAL LN excisional Bx- NEGATIVE [Dr.Byrnett]; JAN 2018- CT NED; sub cm LN     Malignant lymphoma, large cell, diffuse ([place])   10/28/2012 Initial Diagnosis    Malignant lymphoma, large cell, diffuse ([facility])       INTERVAL HISTORY:  Bethany Kelly 46 y.o.  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  In the interim patient denies any new lumps or bumps. Patient admits to excruciating pain mid to lower back over the last 2-3 months progressively getting worse. Radiation down the leg. Made worse with movement. Patient denies any weight loss. Denies any nausea vomiting denies any unusual headaches.  No night sweats or weight loss or  any numbness  REVIEW OF SYSTEMS:  A complete 10 point review of system is done which is negative except mentioned above/history of present illness.   PAST MEDICAL HISTORY :  Past Medical History:  Diagnosis Date  . Anemia   . Anxiety   . Chronic kidney disease    kidney stones  . Chronic sinusitis   . Diffuse large B cell lymphoma ([place]) 2013  . Fatigue   . GERD (gastroesophageal reflux disease)   . Hypertension    past history of hypertension  . Hypoalbuminemia   . Personal history of chemotherapy June 2013 , and  March 2014  . Weight loss     PAST SURGICAL HISTORY :   Past Surgical History:  Procedure Laterality Date  . ABDOMINAL HYSTERECTOMY    . bladder tacking    . INGUINAL LYMPH NODE BIOPSY Right 04/21/2016    INGUINAL LYMPH NODE BIOPSY;  REACTIVE LYMPHADENOPATHY.  Robert Bellow, MD;  [facility] ORS;General;  Laterality: Right;  . 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 PORTACATH PLACEMENT  2013  . RECTAL PROLAPSE REPAIR    . TUBAL LIGATION      FAMILY HISTORY :   Family History  Problem Relation Age of Onset  . Adopted: Yes  . Family history unknown: Yes    SOCIAL HISTORY:   Social History  Substance Use Topics  . Smoking status: Former Smoker    Packs/day: 1.00    Types: Cigarettes    Quit date: 12/16/1998  . Smokeless tobacco: Never Used  . Alcohol use Yes     Comment: Occassional  ALLERGIES:  is allergic to penicillins.  MEDICATIONS:  Current Outpatient Prescriptions  Medication Sig Dispense Refill  . albuterol (PROVENTIL HFA;VENTOLIN HFA) 108 (90 Base) MCG/ACT inhaler Inhale 2 puffs into the lungs every 4 (four) hours as needed.     . citalopram (CELEXA) 10 MG tablet TAKE ONE TABLET BY MOUTH ONCE DAILY 30 tablet 6  . Cyanocobalamin (RA VITAMIN B-12 TR) 1000 MCG TBCR Take 1 tablet by mouth daily.     Marland Kitchen ibuprofen (ADVIL,MOTRIN) 600 MG tablet Take 600 mg by mouth every 6 (six) hours as needed.     Marland Kitchen LACTOBACILLUS RHAMNOSUS, GG, PO Take by mouth.     . loperamide (IMODIUM) 2 MG capsule Take 2 mg by mouth as needed.     . Multiple Vitamin (MULTI-VITAMINS) TABS Take 1 tablet by mouth daily.     Marland Kitchen omeprazole (PRILOSEC) 20 MG capsule TAKE ONE CAPSULE BY MOUTH ONCE DAILY 30 capsule 3  . ondansetron (ZOFRAN ODT) 4 MG disintegrating tablet Take 1 tablet (4 mg total) by mouth every 6 (six) hours as needed for nausea or vomiting. 30 tablet 6  . oxyCODONE-acetaminophen (PERCOCET/ROXICET) 5-325 MG tablet Take 1 tablet by mouth every 4 (four) hours as needed for severe pain.    Marland Kitchen pyridOXINE (VITAMIN B-6) 25 MG tablet Take 25 mg by mouth daily.     . tamsulosin (FLOMAX) 0.4 MG CAPS capsule Take 1 capsule (0.4 mg total) by mouth daily. 30 capsule 0  . triamcinolone ointment (KENALOG) 0.1 % Apply topically as needed.     . Turmeric Curcumin 500 MG CAPS Take by mouth daily.      No current facility-administered medications for this visit.    Facility-Administered Medications Ordered in Other Visits  Medication Dose Route Frequency Provider Last Rate Last Dose  . sodium chloride flush (NS) 0.9 % injection 10 mL  10 mL Intravenous PRN Cammie Sickle, MD        PHYSICAL EXAMINATION: ECOG PERFORMANCE STATUS: 0 - Asymptomatic  BP 114/81   Pulse 96   Temp 99.5 F (37.5 C) (Tympanic)   Resp 18   Wt 221 lb 6.4 oz (100.4 kg)   BMI 33.66 kg/m   Filed Weights   02/05/17 0939  Weight: 221 lb 6.4 oz (100.4 kg)    GENERAL: Well-nourished well-developed; Alert, no distress and comfortable.   Alone. EYES: no pallor or icterus OROPHARYNX: no thrush or ulceration; good dentition  NECK: supple, no masses felt LYMPH:  no palpable lymphadenopathy in the cervical, axillary or inguinal regions LUNGS: clear to auscultation and  No wheeze or crackles HEART/CVS: regular rate & rhythm and no murmurs; No lower extremity edema ABDOMEN:abdomen soft, non-tender and normal bowel sounds Musculoskeletal:no cyanosis of digits and no clubbing  PSYCH: alert &  oriented x 3 with fluent speech NEURO: no focal motor/sensory deficits SKIN:  No rash.   LABORATORY DATA:  I have reviewed the data as listed    Component Value Date/Time   NA 137 02/05/2017 0923   NA 141 08/19/2014 1038   K 4.0 02/05/2017 0923   K 4.3 08/19/2014 1038   CL 104 02/05/2017 0923   CL 103 08/19/2014 1038   CO2 27 02/05/2017 0923   CO2 29 08/19/2014 1038   GLUCOSE 134 (H) 02/05/2017 0923   GLUCOSE 103 (H) 08/19/2014 1038   BUN 10 02/05/2017 0923   BUN 9 08/19/2014 1038   CREATININE 1.00 02/05/2017 0923   CREATININE 1.07 08/19/2014 1038   CALCIUM 9.0 02/05/2017  1660   CALCIUM 8.6 08/19/2014 1038   PROT 7.3 02/05/2017 0923   PROT 7.0 08/19/2014 1038   ALBUMIN 4.1 02/05/2017 0923   ALBUMIN 3.7 08/19/2014 1038   AST 34 02/05/2017 0923   AST 18 08/19/2014 1038   ALT 41 02/05/2017 0923   ALT 36 08/19/2014 1038   ALKPHOS 67 02/05/2017 0923   ALKPHOS 81 08/19/2014 1038   BILITOT 0.8 02/05/2017 0923   BILITOT 0.5 08/19/2014 1038   GFRNONAA >60 02/05/2017 0923   GFRNONAA 59 (L) 08/19/2014 1038   GFRNONAA >60 03/16/2014 0911   GFRAA >60 02/05/2017 0923   GFRAA >60 08/19/2014 1038   GFRAA >60 03/16/2014 0911    No results found for: SPEP, UPEP  Lab Results  Component Value Date   WBC 8.9 02/05/2017   NEUTROABS 6.9 (H) 02/05/2017   HGB 13.5 02/05/2017   HCT 38.3 02/05/2017   MCV 90.8 02/05/2017   PLT 183 02/05/2017      Chemistry      Component Value Date/Time   NA 137 02/05/2017 0923   NA 141 08/19/2014 1038   K 4.0 02/05/2017 0923   K 4.3 08/19/2014 1038   CL 104 02/05/2017 0923   CL 103 08/19/2014 1038   CO2 27 02/05/2017 0923   CO2 29 08/19/2014 1038   BUN 10 02/05/2017 0923   BUN 9 08/19/2014 1038   CREATININE 1.00 02/05/2017 0923   CREATININE 1.07 08/19/2014 1038      Component Value Date/Time   CALCIUM 9.0 02/05/2017 0923   CALCIUM 8.6 08/19/2014 1038   ALKPHOS 67 02/05/2017 0923   ALKPHOS 81 08/19/2014 1038   AST 34 02/05/2017 0923    AST 18 08/19/2014 1038   ALT 41 02/05/2017 0923   ALT 36 08/19/2014 1038   BILITOT 0.8 02/05/2017 0923   BILITOT 0.5 08/19/2014 1038     IMPRESSION: 1. Stable exam.  No new or progressive findings. 2. Retrocaval and left iliac lymph nodes are stable since prior study. 3. Cholelithiasis. 4. Nonobstructing tiny bilateral renal stones. 5. Hepatic steatosis. 6.  Aortic Atherosclerois (ICD10-170.0)   Electronically Signed   By: Misty Stanley M.D.   On: 02/02/2017 11:56  RADIOGRAPHIC STUDIES: I have personally reviewed the radiological images as listed and agreed with the findings in the report. No results found.   ASSESSMENT & PLAN:  Malignant lymphoma, large cell, diffuse ([place]) #  diffuse large B-cell lymphoma status post autologous transplant 2013;  July 10 th 2018- CT C/A/P- NED; except for STABLE subcm left pelvic LN; Sep 2017- Right inguinal LN s/p excision- NEG for malignancy-reactive lymph node. Labs- N.   # Given the CT scan of the chest abdomen pelvis is negative for any recurrence; the worsening back pain is not explainable based on current imaging. A PET would be reasonable; however I think MRI would be a better  to look at the spinal cord  # severe Low back pain- 3 months.. On NSAIDs.   # cont Port flush every 2 months; see me/no labs.   # I reviewed the blood work- with the patient in detail; also reviewed the imaging independently [as summarized above]; and with the patient in detail.     Orders Placed This Encounter  Procedures  . NM PET Image Restag (PS) Skull Base To Thigh    Standing Status:   Future    Standing Expiration Date:   04/07/2018    Order Specific Question:   Reason for Exam (SYMPTOM  OR  DIAGNOSIS REQUIRED)    Answer:   DLBCL; with severe back pain    Order Specific Question:   Is the patient pregnant?    Answer:   No    Order Specific Question:   Preferred imaging location?    Answer:   [facility]  . MR Lumbar Spine W Wo Contrast     Standing Status:   Future    Standing Expiration Date:   04/09/2018    Order Specific Question:   If indicated for the ordered procedure, I authorize the administration of contrast media per Radiology protocol    Answer:   Yes    Order Specific Question:   Reason for Exam (SYMPTOM  OR DIAGNOSIS REQUIRED)    Answer:   back pain    Order Specific Question:   What is the patient's sedation requirement?    Answer:   No Sedation    Order Specific Question:   Does the patient have a pacemaker or implanted devices?    Answer:   No    Order Specific Question:   Preferred imaging location?    Answer:   [facility] (table limit-300lbs)    Order Specific Question:   Radiology Contrast Protocol - do NOT remove file path    Answer:   [email or web address]   All questions were answered. The patient knows to call the clinic with any problems, questions or concerns.      Cammie Sickle, MD 02/06/2017 8:16 AM

## 2017-02-06 ENCOUNTER — Telehealth: Payer: Self-pay | Admitting: Internal Medicine

## 2017-02-06 DIAGNOSIS — M549 Dorsalgia, unspecified: Secondary | ICD-10-CM | POA: Insufficient documentation

## 2017-02-06 NOTE — Telephone Encounter (Signed)
Attempted to reach patient. Unable to leave vm. Scheduling has already scheduled mri. & cnl pet

## 2017-02-06 NOTE — Telephone Encounter (Signed)
Please inform patient that I would like to get an MRI of the lumbar spine for further evaluation; instead of the PET scan. CANCEL the PET  [it is scheduled for tomorrow]; set up MRI ASAP.

## 2017-02-07 ENCOUNTER — Telehealth: Payer: Self-pay | Admitting: *Deleted

## 2017-02-07 ENCOUNTER — Ambulatory Visit: Admission: RE | Admit: 2017-02-07 | Payer: BLUE CROSS/BLUE SHIELD | Source: Ambulatory Visit

## 2017-02-07 NOTE — Telephone Encounter (Signed)
Called to report she is having chills fever, ha, body aches, did have a sore throat last week, but not now. Per Dr B, take Tylenol for next couple of days if no better in a couple of days, call back Friday and we will see about ordering abx. She repeated this back to me

## 2017-02-10 ENCOUNTER — Ambulatory Visit
Admission: RE | Admit: 2017-02-10 | Discharge: 2017-02-10 | Disposition: A | Discharge status: Home / Self Care | Payer: BLUE CROSS/BLUE SHIELD | Source: Ambulatory Visit | Attending: Internal Medicine | Admitting: Internal Medicine

## 2017-02-10 DIAGNOSIS — C8330 Diffuse large B-cell lymphoma, unspecified site: Secondary | ICD-10-CM | POA: Diagnosis not present

## 2017-02-10 DIAGNOSIS — M5127 Other intervertebral disc displacement, lumbosacral region: Secondary | ICD-10-CM | POA: Diagnosis not present

## 2017-02-10 DIAGNOSIS — M549 Dorsalgia, unspecified: Secondary | ICD-10-CM | POA: Diagnosis not present

## 2017-02-10 MED ORDER — GADOBENATE DIMEGLUMINE 529 MG/ML IV SOLN
20.0000 mL | Freq: Once | INTRAVENOUS | Status: AC | PRN
  Administered 2017-02-10: 20 mL via INTRAVENOUS

## 2017-02-11 ENCOUNTER — Emergency Department: Payer: BLUE CROSS/BLUE SHIELD

## 2017-02-11 ENCOUNTER — Emergency Department
Admission: EM | Admit: 2017-02-11 | Discharge: 2017-02-11 | Disposition: A | Discharge status: Home / Self Care | Payer: BLUE CROSS/BLUE SHIELD | Attending: Emergency Medicine | Admitting: Emergency Medicine

## 2017-02-11 DIAGNOSIS — S82891A Other fracture of right lower leg, initial encounter for closed fracture: Secondary | ICD-10-CM

## 2017-02-11 DIAGNOSIS — Z79899 Other long term (current) drug therapy: Secondary | ICD-10-CM | POA: Insufficient documentation

## 2017-02-11 DIAGNOSIS — W010XXA Fall on same level from slipping, tripping and stumbling without subsequent striking against object, initial encounter: Secondary | ICD-10-CM | POA: Diagnosis not present

## 2017-02-11 DIAGNOSIS — Y929 Unspecified place or not applicable: Secondary | ICD-10-CM | POA: Insufficient documentation

## 2017-02-11 DIAGNOSIS — I129 Hypertensive chronic kidney disease with stage 1 through stage 4 chronic kidney disease, or unspecified chronic kidney disease: Secondary | ICD-10-CM | POA: Diagnosis not present

## 2017-02-11 DIAGNOSIS — Z87891 Personal history of nicotine dependence: Secondary | ICD-10-CM | POA: Diagnosis not present

## 2017-02-11 DIAGNOSIS — S8291XA Unspecified fracture of right lower leg, initial encounter for closed fracture: Secondary | ICD-10-CM | POA: Insufficient documentation

## 2017-02-11 DIAGNOSIS — Y939 Activity, unspecified: Secondary | ICD-10-CM | POA: Insufficient documentation

## 2017-02-11 DIAGNOSIS — Y999 Unspecified external cause status: Secondary | ICD-10-CM | POA: Diagnosis not present

## 2017-02-11 DIAGNOSIS — S99911A Unspecified injury of right ankle, initial encounter: Secondary | ICD-10-CM | POA: Diagnosis present

## 2017-02-11 DIAGNOSIS — N189 Chronic kidney disease, unspecified: Secondary | ICD-10-CM | POA: Diagnosis not present

## 2017-02-11 MED ORDER — IBUPROFEN 600 MG PO TABS: 600.0000 mg | ORAL_TABLET | Freq: Four times a day (QID) | ORAL | 0 refills | Status: DC | PRN

## 2017-02-11 MED ORDER — OXYCODONE-ACETAMINOPHEN 5-325 MG PO TABS
1.0000 | ORAL_TABLET | Freq: Once | ORAL | Status: AC
  Administered 2017-02-11: 1 via ORAL
  Filled 2017-02-11: qty 1

## 2017-02-11 MED ORDER — IBUPROFEN 600 MG PO TABS
600.0000 mg | ORAL_TABLET | Freq: Once | ORAL | Status: AC
  Administered 2017-02-11: 600 mg via ORAL
  Filled 2017-02-11: qty 1

## 2017-02-11 MED ORDER — OXYCODONE-ACETAMINOPHEN 7.5-325 MG PO TABS: 1.0000 | ORAL_TABLET | ORAL | 0 refills | Status: DC | PRN

## 2017-02-11 NOTE — Discharge Instructions (Signed)
Wear splint and ambulate with crutches until evaluation by orthopedic clinic. Call tomorrow morning to schedule appointment.

## 2017-02-11 NOTE — ED Provider Notes (Signed)
[facility] Emergency Department Provider Note   ____________________________________________   First MD Initiated Contact with Patient 02/11/17 2126     (approximate)  I have reviewed the triage vital signs and the nursing notes.   HISTORY  Chief Complaint Ankle Pain    HPI Bethany Kelly is a 46 y.o. female patient complaining of right ankle pain secondary stepped in a hole approximately one half hours ago. Patient stated pain increases with ambulation. Patient rates pain as a 1/10 at rest at 5/10 with weightbearing. Patient described a pain as "achy". No palliative measures except for ice pack which was applied at triage.   Past Medical History:  Diagnosis Date  . Anemia   . Anxiety   . Chronic kidney disease    kidney stones  . Chronic sinusitis   . 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 Fatigue   . GERD (gastroesophageal reflux disease)   . Hypertension    past history of hypertension  . Hypoalbuminemia   . Personal history of chemotherapy June 2013 , and  March 2014  . Weight loss     Patient Active Problem List   Diagnosis Date Noted  . Back pain, subacute 02/06/2017  . Non-Hodgkin's lymphoma ([place]) 09/27/2015  . Chronic sinusitis   . Acid reflux 10/29/2012  . BP (high blood pressure) 10/28/2012  . Malignant lymphoma, large cell, diffuse ([place]) 10/28/2012  . 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 ABDOMINAL HYSTERECTOMY    . bladder tacking    . INGUINAL LYMPH NODE BIOPSY Right 04/21/2016    INGUINAL LYMPH NODE BIOPSY;  REACTIVE LYMPHADENOPATHY.  Robert Bellow, MD;  [facility] ORS;General;  Laterality: Right;  . 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 PORTACATH PLACEMENT  2013  . RECTAL PROLAPSE REPAIR    . TUBAL LIGATION      Prior to Admission medications   Medication Sig Start Date End Date  Taking? Authorizing Provider  albuterol (PROVENTIL HFA;VENTOLIN HFA) 108 (90 Base) MCG/ACT inhaler Inhale 2 puffs into the lungs every 4 (four) hours as needed.  11/26/14   [provider]  citalopram (CELEXA) 10 MG tablet TAKE ONE TABLET BY MOUTH ONCE DAILY 08/28/16   Cammie Sickle, MD  Cyanocobalamin (RA VITAMIN B-12 TR) 1000 MCG TBCR Take 1 tablet by mouth daily.     [provider]  ibuprofen (ADVIL,MOTRIN) 600 MG tablet Take 600 mg by mouth every 6 (six) hours as needed.     [provider]  ibuprofen (ADVIL,MOTRIN) 600 MG tablet Take 1 tablet (600 mg total) by mouth every 6 (six) hours as needed. 02/11/17   Sable Feil, PA-C  LACTOBACILLUS RHAMNOSUS, GG, PO Take by mouth.    [provider]  loperamide (IMODIUM) 2 MG capsule Take 2 mg by mouth as needed.  04/08/14   [provider]  Multiple Vitamin (MULTI-VITAMINS) TABS Take 1 tablet by mouth daily.     [provider]  omeprazole (PRILOSEC) 20 MG capsule TAKE ONE CAPSULE BY MOUTH ONCE DAILY 12/08/16   Cammie Sickle, MD  ondansetron (ZOFRAN ODT) 4 MG disintegrating tablet Take 1 tablet (4 mg total) by mouth every 6 (six) hours as needed for nausea or vomiting. 04/07/16   Cammie Sickle, MD  oxyCODONE-acetaminophen (PERCOCET) 7.5-325 MG tablet Take 1 tablet by mouth every 4 (four) hours  as needed for severe pain. 02/11/17   Sable Feil, PA-C  oxyCODONE-acetaminophen (PERCOCET/ROXICET) 5-325 MG tablet Take 1 tablet by mouth every 4 (four) hours as needed for severe pain.    [provider]  pyridOXINE (VITAMIN B-6) 25 MG tablet Take 25 mg by mouth daily.     [provider]  tamsulosin (FLOMAX) 0.4 MG CAPS capsule Take 1 capsule (0.4 mg total) by mouth daily. 01/10/15   Orbie Pyo, MD  triamcinolone ointment (KENALOG) 0.1 % Apply topically as needed.  03/23/16 03/23/17  [provider]  Turmeric Curcumin 500 MG CAPS Take by mouth  daily.     [provider]    Allergies Penicillins  Family History  Problem Relation Age of Onset  . Adopted: Yes  . Family history unknown: Yes    Social History Social History  Substance Use Topics  . Smoking status: Former Smoker    Packs/day: 1.00    Types: Cigarettes    Quit date: 12/16/1998  . Smokeless tobacco: Never Used  . Alcohol use Yes     Comment: Occassional    Review of Systems  Constitutional: No fever/chills Eyes: No visual changes. ENT: No sore throat. Cardiovascular: Denies chest pain. Respiratory: Denies shortness of breath. Gastrointestinal: No abdominal pain.  No nausea, no vomiting.  No diarrhea.  No constipation. Genitourinary: Negative for dysuria. Musculoskeletal: Negative for back pain. Skin: Negative for rash. Neurological: Negative for headaches, focal weakness or numbness. Endocrine:Hypertension Allergic/Immunilogical: Penicillin ____________________________________________   PHYSICAL EXAM:  VITAL SIGNS: ED Triage Vitals  Enc Vitals Group     BP 02/11/17 2037 129/85     Pulse Rate 02/11/17 2037 81     Resp 02/11/17 2037 18     Temp 02/11/17 2037 98.8 F (37.1 C)     Temp Source 02/11/17 2037 Oral     SpO2 02/11/17 2037 98 %     Weight 02/11/17 2038 220 lb (99.8 kg)     Height 02/11/17 2038 5\' 8"  (1.727 m)     Head Circumference --      Peak Flow --      Pain Score 02/11/17 2045 1     Pain Loc --      Pain Edu? --      Excl. in [place]? --     Constitutional: Alert and oriented. Well appearing and in no acute distress. Cardiovascular: Normal rate, regular rhythm. Grossly normal heart sounds.  Good peripheral circulation. Respiratory: Normal respiratory effort.  No retractions. Lungs CTAB. Gastrointestinal: Soft and nontender. No distention. No abdominal bruits. No CVA tenderness. Musculoskeletal: No obvious deformity. Right lateral ankle pain with palpation.  Neurologic:  Normal speech and language. No gross focal  neurologic deficits are appreciated. No gait instability. Skin:  Skin is warm, dry and intact. No rash noted. Psychiatric: Mood and affect are normal. Speech and behavior are normal.  ____________________________________________   LABS (all labs ordered are listed, but only abnormal results are displayed)  Labs Reviewed - No data to display ____________________________________________  EKG   ____________________________________________  RADIOLOGY  Dg Ankle Complete Right  Result Date: 02/11/2017 CLINICAL DATA:  Twisting injury today.  Pain. EXAM: RIGHT ANKLE - COMPLETE 3+ VIEW COMPARISON:  None. FINDINGS: Lateral soft tissue swelling. Avulsion fracture at the tip of the fibula, nondisplaced. No other regional abnormality. IMPRESSION: Nondisplaced avulsion fracture at the tip of the fibula. Lateral soft tissue swelling. Electronically Signed   By: Nelson Chimes M.D.   On: 02/11/2017 21:25  ____________________________________________   PROCEDURES  Procedure(s) performed: None  Procedures  Critical Care performed: No  ____________________________________________   INITIAL IMPRESSION / ASSESSMENT AND PLAN / ED COURSE  Pertinent labs & imaging results that were available during my care of the patient were reviewed by me and considered in my medical decision making (see chart for details).  Right ankle fracture at the lateral malleolus. Discussed x-ray finding with patient. Patient given discharge Instructions. Patient placed in a splint and given crutches for ambulation. Patient advised to follow-up with orthopedics by calling for an appointment tomorrow morning.      ____________________________________________   FINAL CLINICAL IMPRESSION(S) / ED DIAGNOSES  Final diagnoses:  Closed fracture of right ankle, initial encounter      NEW MEDICATIONS STARTED DURING THIS VISIT:  New Prescriptions   IBUPROFEN (ADVIL,MOTRIN) 600 MG TABLET    Take 1 tablet (600 mg  total) by mouth every 6 (six) hours as needed.   OXYCODONE-ACETAMINOPHEN (PERCOCET) 7.5-325 MG TABLET    Take 1 tablet by mouth every 4 (four) hours as needed for severe pain.     Note:  This document was prepared using Dragon voice recognition software and may include unintentional dictation errors.    Sable Feil, PA-C 02/11/17 2144    Lavonia Drafts, MD 02/13/17 [phone]

## 2017-02-11 NOTE — ED Triage Notes (Signed)
Pt states she stepped in a hole approx one hour ago injuring right ankle. Cms intact to right toes. No obvious deformity noted.

## 2017-02-13 ENCOUNTER — Telehealth: Payer: Self-pay | Admitting: Internal Medicine

## 2017-02-13 NOTE — Telephone Encounter (Signed)
Pt left a msg on my VM that she was calling Dr B back. Her number is [phone].

## 2017-02-13 NOTE — Telephone Encounter (Signed)
Spoke to patient; recommend NSAIDs for week or so. If not improved then recommend follow-up with orthopedics.

## 2017-02-13 NOTE — Telephone Encounter (Signed)
Spoke to [facility] from radiology; reviewed the MRI of the lumbar spine/CT of the thoracic spine- no obvious evidence of malignancy to explain the ongoing back pain.  Left a message for the patient to call us back to discuss the above.

## 2017-02-13 NOTE — Telephone Encounter (Signed)
Called again LMV to call us back.

## 2017-02-14 ENCOUNTER — Other Ambulatory Visit: Payer: Self-pay | Admitting: Internal Medicine

## 2017-02-14 DIAGNOSIS — Z1231 Encounter for screening mammogram for malignant neoplasm of breast: Secondary | ICD-10-CM

## 2017-02-14 DIAGNOSIS — S8263XA Displaced fracture of lateral malleolus of unspecified fibula, initial encounter for closed fracture: Secondary | ICD-10-CM | POA: Insufficient documentation

## 2017-03-04 ENCOUNTER — Encounter: Payer: Self-pay | Admitting: Emergency Medicine

## 2017-03-04 ENCOUNTER — Emergency Department
Admission: EM | Admit: 2017-03-04 | Discharge: 2017-03-04 | Disposition: A | Discharge status: Home / Self Care | Payer: BLUE CROSS/BLUE SHIELD | Attending: Emergency Medicine | Admitting: Emergency Medicine

## 2017-03-04 ENCOUNTER — Emergency Department: Payer: BLUE CROSS/BLUE SHIELD

## 2017-03-04 DIAGNOSIS — I10 Essential (primary) hypertension: Secondary | ICD-10-CM | POA: Insufficient documentation

## 2017-03-04 DIAGNOSIS — Z79899 Other long term (current) drug therapy: Secondary | ICD-10-CM | POA: Insufficient documentation

## 2017-03-04 DIAGNOSIS — M79661 Pain in right lower leg: Secondary | ICD-10-CM | POA: Diagnosis present

## 2017-03-04 LAB — CBC WITH DIFFERENTIAL/PLATELET
Basophils Absolute: 0 10*3/uL (ref 0–0.1)
Basophils Relative: 0 %
Eosinophils Absolute: 0 10*3/uL (ref 0–0.7)
Eosinophils Relative: 0 %
HCT: 38.2 % (ref 35.0–47.0)
Hemoglobin: 13.5 g/dL (ref 12.0–16.0)
Lymphocytes Relative: 18 %
Lymphs Abs: 1.2 10*3/uL (ref 1.0–3.6)
MCH: 31.8 pg (ref 26.0–34.0)
MCHC: 35.3 g/dL (ref 32.0–36.0)
MCV: 90.2 fL (ref 80.0–100.0)
Monocytes Absolute: 0.4 10*3/uL (ref 0.2–0.9)
Monocytes Relative: 6 %
Neutro Abs: 4.7 10*3/uL (ref 1.4–6.5)
Neutrophils Relative %: 76 %
Platelets: 191 10*3/uL (ref 150–440)
RBC: 4.23 MIL/uL (ref 3.80–5.20)
RDW: 13 % (ref 11.5–14.5)
WBC: 6.3 10*3/uL (ref 3.6–11.0)

## 2017-03-04 LAB — COMPREHENSIVE METABOLIC PANEL
ALT: 59 U/L — ABNORMAL HIGH (ref 14–54)
AST: 62 U/L — ABNORMAL HIGH (ref 15–41)
Albumin: 4.1 g/dL (ref 3.5–5.0)
Alkaline Phosphatase: 63 U/L (ref 38–126)
Anion gap: 7 (ref 5–15)
BUN: 10 mg/dL (ref 6–20)
CO2: 28 mmol/L (ref 22–32)
Calcium: 8.8 mg/dL — ABNORMAL LOW (ref 8.9–10.3)
Chloride: 101 mmol/L (ref 101–111)
Creatinine, Ser: 1.04 mg/dL — ABNORMAL HIGH (ref 0.44–1.00)
GFR calc Af Amer: >60 mL/min (ref >60)
GFR calc non Af Amer: >60 mL/min (ref >60)
Glucose, Bld: 98 mg/dL (ref 65–99)
Potassium: 3.9 mmol/L (ref 3.5–5.1)
Sodium: 136 mmol/L (ref 135–145)
Total Bilirubin: 1.1 mg/dL (ref 0.3–1.2)
Total Protein: 7.6 g/dL (ref 6.5–8.1)

## 2017-03-04 LAB — PROTIME-INR
INR: 1.04
Prothrombin Time: 13.6 seconds (ref 11.4–15.2)

## 2017-03-04 MED ORDER — CYCLOBENZAPRINE HCL 10 MG PO TABS: 10.0000 mg | ORAL_TABLET | Freq: Three times a day (TID) | ORAL | 0 refills | Status: DC | PRN

## 2017-03-04 NOTE — ED Provider Notes (Signed)
[facility] Emergency Department Provider Note ____________________________________________  Time seen: Approximately 3:24 PM  I have reviewed the triage vital signs and the nursing notes.   HISTORY  Chief Complaint Leg Pain    HPI Bethany Kelly is a 46 y.o. female who presents to the emergency department for evaluation of right lower extremity pain. She broke her ankle a few weeks ago and has been immobilized. Pain started behind the right knee about 5 days ago. She describes it as a deep ache. It is somewhat relieved by the oxycodone that she is taking post fracture repair. She has not noticed any swelling. She denies chest pain or shortness of breath.   Past Medical History:  Diagnosis Date  . Anemia   . Anxiety   . Chronic kidney disease    kidney stones  . Chronic sinusitis   . 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 Fatigue   . GERD (gastroesophageal reflux disease)   . Hypertension    past history of hypertension  . Hypoalbuminemia   . Personal history of chemotherapy June 2013 , and  March 2014  . Weight loss     Patient Active Problem List   Diagnosis Date Noted  . Back pain, subacute 02/06/2017  . Non-Hodgkin's lymphoma ([place]) 09/27/2015  . Chronic sinusitis   . Acid reflux 10/29/2012  . BP (high blood pressure) 10/28/2012  . 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 ABDOMINAL HYSTERECTOMY    . bladder tacking    . INGUINAL LYMPH NODE BIOPSY Right 04/21/2016    INGUINAL LYMPH NODE BIOPSY;  REACTIVE LYMPHADENOPATHY.  Robert Bellow, MD;  [facility] ORS;General;  Laterality: Right;  . 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 PORTACATH PLACEMENT  2013  . RECTAL PROLAPSE REPAIR    . TUBAL LIGATION      Prior to Admission medications   Medication  Sig Start Date End Date Taking? Authorizing Provider  albuterol (PROVENTIL HFA;VENTOLIN HFA) 108 (90 Base) MCG/ACT inhaler Inhale 2 puffs into the lungs every 4 (four) hours as needed.  11/26/14   [provider]  citalopram (CELEXA) 10 MG tablet TAKE ONE TABLET BY MOUTH ONCE DAILY 08/28/16   Cammie Sickle, MD  Cyanocobalamin (RA VITAMIN B-12 TR) 1000 MCG TBCR Take 1 tablet by mouth daily.     [provider]  cyclobenzaprine (FLEXERIL) 10 MG tablet Take 1 tablet (10 mg total) by mouth 3 (three) times daily as needed for muscle spasms. 03/04/17   Atziry Baranski, Johnette Abraham B, FNP  ibuprofen (ADVIL,MOTRIN) 600 MG tablet Take 600 mg by mouth every 6 (six) hours as needed.     [provider]  ibuprofen (ADVIL,MOTRIN) 600 MG tablet Take 1 tablet (600 mg total) by mouth every 6 (six) hours as needed. 02/11/17   Sable Feil, PA-C  LACTOBACILLUS RHAMNOSUS, GG, PO Take by mouth.    [provider]  loperamide (IMODIUM) 2 MG capsule Take 2 mg by mouth as needed.  04/08/14   [provider]  Multiple Vitamin (MULTI-VITAMINS) TABS Take 1 tablet by mouth daily.     [provider]  omeprazole (PRILOSEC) 20 MG capsule TAKE ONE CAPSULE BY MOUTH ONCE DAILY 12/08/16   Cammie Sickle, MD  ondansetron (ZOFRAN ODT) 4 MG disintegrating tablet Take 1 tablet (4  mg total) by mouth every 6 (six) hours as needed for nausea or vomiting. 04/07/16   Cammie Sickle, MD  oxyCODONE-acetaminophen (PERCOCET) 7.5-325 MG tablet Take 1 tablet by mouth every 4 (four) hours as needed for severe pain. 02/11/17   Sable Feil, PA-C  oxyCODONE-acetaminophen (PERCOCET/ROXICET) 5-325 MG tablet Take 1 tablet by mouth every 4 (four) hours as needed for severe pain.    [provider]  pyridOXINE (VITAMIN B-6) 25 MG tablet Take 25 mg by mouth daily.     [provider]  tamsulosin (FLOMAX) 0.4 MG CAPS capsule Take 1 capsule (0.4 mg total) by mouth daily. 01/10/15    Orbie Pyo, MD  triamcinolone ointment (KENALOG) 0.1 % Apply topically as needed.  03/23/16 03/23/17  [provider]  Turmeric Curcumin 500 MG CAPS Take by mouth daily.     [provider]    Allergies Penicillins  Family History  Problem Relation Age of Onset  . Adopted: Yes  . Family history unknown: Yes    Social History Social History  Substance Use Topics  . Smoking status: Former Smoker    Packs/day: 1.00    Types: Cigarettes    Quit date: 12/16/1998  . Smokeless tobacco: Never Used  . Alcohol use Yes     Comment: Occassional    Review of Systems Constitutional: Negative for fever Cardiovascular: Negative for chest pain Respiratory: Negative for shortness of breath. Musculoskeletal: Positive for pain in the RLE Skin: Negative for swelling or lesion.  Neurological: Negative for paresthesias.  ____________________________________________   PHYSICAL EXAM:  VITAL SIGNS: ED Triage Vitals  Enc Vitals Group     BP 03/04/17 1349 127/90     Pulse Rate 03/04/17 1349 (!) 107     Resp 03/04/17 1349 18     Temp 03/04/17 1349 98.4 F (36.9 C)     Temp Source 03/04/17 1349 Oral     SpO2 03/04/17 1349 97 %     Weight 03/04/17 1349 220 lb (99.8 kg)     Height 03/04/17 1349 5\' 8"  (1.727 m)     Head Circumference --      Peak Flow --      Pain Score 03/04/17 1348 7     Pain Loc --      Pain Edu? --      Excl. in [place]? --     Constitutional: Alert and oriented. Well appearing and in no acute distress. Eyes: Conjunctivae are clear without discharge or drainage.  Head: Atraumatic Neck: Full, active range of motion observed. Respiratory: Respirations even and unlabored. Breath sounds clear to auscultation. Musculoskeletal: Right lower extremity with 2+ DP pulse. No edema noted. Neurologic: Sharp and dull sensation is intact  Skin: Warm and dry without erythema  Psychiatric: Behavior and affect are  appropriate  ____________________________________________   LABS (all labs ordered are listed, but only abnormal results are displayed)  Labs Reviewed  COMPREHENSIVE METABOLIC PANEL - Abnormal; Notable for the following:       Result Value   Creatinine, Ser 1.04 (*)    Calcium 8.8 (*)    AST 62 (*)    ALT 59 (*)    All other components within normal limits  CBC WITH DIFFERENTIAL/PLATELET  PROTIME-INR   ____________________________________________  RADIOLOGY  No evidence of DVT in the right lower extremity per radiology. ____________________________________________   PROCEDURES  Procedure(s) performed: None  ____________________________________________   INITIAL IMPRESSION / ASSESSMENT AND PLAN / ED COURSE  Rulon Sera  Mundorf is a 46 y.o. female who presents to the emergency department for evaluation and treatment of pain in the right lower extremity. She is wearing a walking boot due to a closed fracture of the lateral malleolus that she sustained approximately 3 weeks ago. Wells score is 3. Ultrasound was negative for DVT per radiology. Pain is likely musculoskeletal in secondary to altered gait and the heaviness of the walking boot. Labs were drawn due to patient's history of renal failure and potential prescription for Eliquis. Incidental finding of mildly increased LFTs. She was advised to follow-up with her primary care provider for recheck in about 2 weeks. She was also encouraged to return to the emergency department for symptoms change or worsen if she's unable schedule an appointment.  Pertinent labs & imaging results that were available during my care of the patient were reviewed by me and considered in my medical decision making (see chart for details).  _________________________________________   FINAL CLINICAL IMPRESSION(S) / ED DIAGNOSES  Final diagnoses:  Right calf pain    Discharge Medication List as of 03/04/2017  3:50 PM    START taking these  medications   Details  cyclobenzaprine (FLEXERIL) 10 MG tablet Take 1 tablet (10 mg total) by mouth 3 (three) times daily as needed for muscle spasms., Starting Sun 03/04/2017, Print        If controlled substance prescribed during this visit, 12 month history viewed on the [place] prior to issuing an initial prescription for Schedule II or III opiod.    Victorino Dike, FNP 03/04/17 1605    Harvest Dark, MD 03/05/17 2006

## 2017-03-04 NOTE — ED Triage Notes (Signed)
Pt c/o pain behind right knee for few days.  Broke this ankle a few weeks ago.  Has not noticed swelling just pain.  No fevers.

## 2017-03-04 NOTE — Discharge Instructions (Signed)
Your liver enzymes are slightly elevated. Please follow up with your primary care provider for a recheck in about 2 weeks.  See your orthopedist if pain is not improving with the muscle relaxer and your pain medication.   Return to the ER for symptoms that change or worsen if unable to schedule an appointment.

## 2017-03-21 ENCOUNTER — Ambulatory Visit
Admission: RE | Admit: 2017-03-21 | Discharge: 2017-03-21 | Disposition: A | Discharge status: Home / Self Care | Payer: BLUE CROSS/BLUE SHIELD | Source: Ambulatory Visit | Attending: Internal Medicine | Admitting: Internal Medicine

## 2017-03-21 DIAGNOSIS — Z1231 Encounter for screening mammogram for malignant neoplasm of breast: Secondary | ICD-10-CM | POA: Diagnosis not present

## 2017-04-09 ENCOUNTER — Inpatient Hospital Stay: Payer: BLUE CROSS/BLUE SHIELD

## 2017-04-09 ENCOUNTER — Inpatient Hospital Stay: Payer: BLUE CROSS/BLUE SHIELD | Attending: Internal Medicine | Admitting: Internal Medicine

## 2017-04-09 VITALS — BP 120/84 | HR 81 | Temp 97.8°F | Resp 20 | Ht 68.0 in | Wt 220.0 lb

## 2017-04-09 DIAGNOSIS — Z9484 Stem cells transplant status: Secondary | ICD-10-CM | POA: Insufficient documentation

## 2017-04-09 DIAGNOSIS — Z9221 Personal history of antineoplastic chemotherapy: Secondary | ICD-10-CM | POA: Insufficient documentation

## 2017-04-09 DIAGNOSIS — R140 Abdominal distension (gaseous): Secondary | ICD-10-CM | POA: Insufficient documentation

## 2017-04-09 DIAGNOSIS — K760 Fatty (change of) liver, not elsewhere classified: Secondary | ICD-10-CM | POA: Diagnosis not present

## 2017-04-09 DIAGNOSIS — E8809 Other disorders of plasma-protein metabolism, not elsewhere classified: Secondary | ICD-10-CM | POA: Insufficient documentation

## 2017-04-09 DIAGNOSIS — Z8572 Personal history of non-Hodgkin lymphomas: Secondary | ICD-10-CM | POA: Diagnosis not present

## 2017-04-09 DIAGNOSIS — K8020 Calculus of gallbladder without cholecystitis without obstruction: Secondary | ICD-10-CM | POA: Insufficient documentation

## 2017-04-09 DIAGNOSIS — Z79899 Other long term (current) drug therapy: Secondary | ICD-10-CM | POA: Diagnosis not present

## 2017-04-09 DIAGNOSIS — N200 Calculus of kidney: Secondary | ICD-10-CM

## 2017-04-09 DIAGNOSIS — K219 Gastro-esophageal reflux disease without esophagitis: Secondary | ICD-10-CM | POA: Diagnosis not present

## 2017-04-09 DIAGNOSIS — Z87891 Personal history of nicotine dependence: Secondary | ICD-10-CM | POA: Insufficient documentation

## 2017-04-09 DIAGNOSIS — M545 Low back pain: Secondary | ICD-10-CM | POA: Insufficient documentation

## 2017-04-09 DIAGNOSIS — I10 Essential (primary) hypertension: Secondary | ICD-10-CM | POA: Insufficient documentation

## 2017-04-09 DIAGNOSIS — J329 Chronic sinusitis, unspecified: Secondary | ICD-10-CM | POA: Diagnosis not present

## 2017-04-09 DIAGNOSIS — Z452 Encounter for adjustment and management of vascular access device: Secondary | ICD-10-CM | POA: Diagnosis not present

## 2017-04-09 DIAGNOSIS — D649 Anemia, unspecified: Secondary | ICD-10-CM

## 2017-04-09 DIAGNOSIS — Z87442 Personal history of urinary calculi: Secondary | ICD-10-CM | POA: Insufficient documentation

## 2017-04-09 DIAGNOSIS — R5383 Other fatigue: Secondary | ICD-10-CM | POA: Diagnosis not present

## 2017-04-09 DIAGNOSIS — R948 Abnormal results of function studies of other organs and systems: Secondary | ICD-10-CM | POA: Diagnosis not present

## 2017-04-09 DIAGNOSIS — C801 Malignant (primary) neoplasm, unspecified: Secondary | ICD-10-CM

## 2017-04-09 DIAGNOSIS — C8330 Diffuse large B-cell lymphoma, unspecified site: Secondary | ICD-10-CM

## 2017-04-09 MED ORDER — SODIUM CHLORIDE 0.9% FLUSH
10.0000 mL | INTRAVENOUS | Status: DC | PRN
  Administered 2017-04-09: 10 mL via INTRAVENOUS
  Filled 2017-04-09: qty 10

## 2017-04-09 MED ORDER — HEPARIN SOD (PORK) LOCK FLUSH 100 UNIT/ML IV SOLN
500.0000 [IU] | Freq: Once | INTRAVENOUS | Status: AC
  Administered 2017-04-09: 500 [IU] via INTRAVENOUS
  Filled 2017-04-09: qty 5

## 2017-04-09 NOTE — Progress Notes (Signed)
[place] OFFICE PROGRESS NOTE  Patient Care Team: Patient, No Pcp Per as PCP - General ([place]) Cammie Sickle, MD as Consulting Physician (Internal Medicine) Bary Castilla Forest Gleason, MD (General Surgery)  Cancer Staging No matching staging information was found for the patient.   Oncology History   Chief Complaint/Diagnosis:   # 2013-  admitted with hypercalcemia and abnormal CT scan of the abdomen  2. 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 Patient was started on RCHOP chemotherapy June of 2013 4. High-dose methotrexate in alternate cycles starting from July 29. 5.last dose of all chemotherapy with R. CHOP (April 29, 2012) patient received total 6 cycles of chemotherapy and tolerated treatment very well without any significant side effect 6.and underwent high dose chemotherapy and stem cell support High-dose chemotherapy with BEAM regimen.  (March 24: 014  # SEP 13th 2017- ~1.2cm R Ingiunal LN; 63mm ex Iliac LN ? Recurrence; RIGHT INGUINAL LN excisional Bx- NEGATIVE [Dr.Byrnett]; JAN 2018- CT NED; sub cm LN     Malignant lymphoma, large cell, diffuse ([place])   10/28/2012 Initial Diagnosis    Malignant lymphoma, large cell, diffuse ([facility])       INTERVAL HISTORY:  Bethany Kelly 46 y.o.  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   In the interim patient was evaluated at [facility] [place] bone marrow transplant team; she had a good report; and had been discharged from the clinic.  Patient's back pain has improved. However she notes to have ongoing bloating of the abdomen. Denies any significant nausea vomiting. Denies any diarrhea constipation. Patient denies any weight loss.Denies any unusual  headaches.  No night sweats or weight loss or any numbness.   REVIEW OF SYSTEMS:  A complete 10 point review of system is done which is negative except mentioned above/history of present illness.   PAST MEDICAL HISTORY :  Past Medical History:  Diagnosis Date  . Anemia   . Anxiety   . Chronic kidney disease    kidney stones  . Chronic sinusitis   . Diffuse large B cell lymphoma ([place]) 2013  . Fatigue   . GERD (gastroesophageal reflux disease)   . Hypertension    past history of hypertension  . Hypoalbuminemia   . Personal history of chemotherapy June 2013 , and  March 2014  . Weight loss     PAST SURGICAL HISTORY :   Past Surgical History:  Procedure Laterality Date  . ABDOMINAL HYSTERECTOMY    . bladder tacking    . INGUINAL LYMPH NODE BIOPSY Right 04/21/2016    INGUINAL LYMPH NODE BIOPSY;  REACTIVE LYMPHADENOPATHY.  Robert Bellow, MD;  [facility] ORS;General;  Laterality: Right;  . 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 PORTACATH PLACEMENT  2013  . RECTAL PROLAPSE REPAIR    . TUBAL LIGATION      FAMILY HISTORY :   Family History  Problem Relation Age of Onset  . Adopted: Yes  . Family history unknown: Yes    SOCIAL HISTORY:   Social History  Substance Use Topics  . Smoking status: Former Smoker    Packs/day: 1.00    Types: Cigarettes    Quit date: 12/16/1998  . Smokeless tobacco: Never Used  . Alcohol use Yes  Comment: Occassional    ALLERGIES:  is allergic to penicillins.  MEDICATIONS:  Current Outpatient Prescriptions  Medication Sig Dispense Refill  . citalopram (CELEXA) 10 MG tablet TAKE ONE TABLET BY MOUTH ONCE DAILY 30 tablet 6  . Cyanocobalamin (RA VITAMIN B-12 TR) 1000 MCG TBCR Take 1 tablet by mouth daily.     Marland Kitchen LACTOBACILLUS RHAMNOSUS, GG, PO Take by mouth.    . Multiple Vitamin (MULTI-VITAMINS) TABS Take 1 tablet by mouth daily.     Marland Kitchen omeprazole (PRILOSEC) 20 MG capsule TAKE ONE CAPSULE BY MOUTH ONCE DAILY 30 capsule 3  . pyridOXINE (VITAMIN  B-6) 25 MG tablet Take 25 mg by mouth daily.     . Turmeric Curcumin 500 MG CAPS Take by mouth daily.     Marland Kitchen albuterol (PROVENTIL HFA;VENTOLIN HFA) 108 (90 Base) MCG/ACT inhaler Inhale 2 puffs into the lungs every 4 (four) hours as needed.     Marland Kitchen ibuprofen (ADVIL,MOTRIN) 600 MG tablet Take 1 tablet (600 mg total) by mouth every 6 (six) hours as needed. (Patient not taking: Reported on 04/09/2017) 30 tablet 0  . ondansetron (ZOFRAN ODT) 4 MG disintegrating tablet Take 1 tablet (4 mg total) by mouth every 6 (six) hours as needed for nausea or vomiting. (Patient not taking: Reported on 04/09/2017) 30 tablet 6  . tamsulosin (FLOMAX) 0.4 MG CAPS capsule Take 1 capsule (0.4 mg total) by mouth daily. (Patient not taking: Reported on 04/09/2017) 30 capsule 0   No current facility-administered medications for this visit.    Facility-Administered Medications Ordered in Other Visits  Medication Dose Route Frequency Provider Last Rate Last Dose  . heparin lock flush 100 unit/mL  500 Units Intravenous Once Charlaine Dalton R, MD      . sodium chloride flush (NS) 0.9 % injection 10 mL  10 mL Intravenous PRN Charlaine Dalton R, MD      . sodium chloride flush (NS) 0.9 % injection 10 mL  10 mL Intravenous PRN Cammie Sickle, MD        PHYSICAL EXAMINATION: ECOG PERFORMANCE STATUS: 0 - Asymptomatic  BP 120/84 (BP Location: Right Arm, Patient Position: Sitting)   Pulse 81   Temp 97.8 F (36.6 C) (Tympanic)   Resp 20   Ht 5\' 8"  (1.727 m)   Wt 220 lb (99.8 kg)   BMI 33.45 kg/m   Filed Weights   04/09/17 1504  Weight: 220 lb (99.8 kg)    GENERAL: Well-nourished well-developed; Alert, no distress and comfortable. Accompanied by family.  EYES: no pallor or icterus OROPHARYNX: no thrush or ulceration; good dentition  NECK: supple, no masses felt LYMPH:  no palpable lymphadenopathy in the cervical, axillary or inguinal regions LUNGS: clear to auscultation and  No wheeze or crackles HEART/CVS:  regular rate & rhythm and no murmurs; No lower extremity edema ABDOMEN:abdomen soft, non-tender and normal bowel sounds Musculoskeletal:no cyanosis of digits and no clubbing  PSYCH: alert & oriented x 3 with fluent speech NEURO: no focal motor/sensory deficits SKIN:  No rash.   LABORATORY DATA:  I have reviewed the data as listed    Component Value Date/Time   NA 136 03/04/2017 1426   NA 141 08/19/2014 1038   K 3.9 03/04/2017 1426   K 4.3 08/19/2014 1038   CL 101 03/04/2017 1426   CL 103 08/19/2014 1038   CO2 28 03/04/2017 1426   CO2 29 08/19/2014 1038   GLUCOSE 98 03/04/2017 1426   GLUCOSE 103 (H) 08/19/2014 1038   BUN  10 03/04/2017 1426   BUN 9 08/19/2014 1038   CREATININE 1.04 (H) 03/04/2017 1426   CREATININE 1.07 08/19/2014 1038   CALCIUM 8.8 (L) 03/04/2017 1426   CALCIUM 8.6 08/19/2014 1038   PROT 7.6 03/04/2017 1426   PROT 7.0 08/19/2014 1038   ALBUMIN 4.1 03/04/2017 1426   ALBUMIN 3.7 08/19/2014 1038   AST 62 (H) 03/04/2017 1426   AST 18 08/19/2014 1038   ALT 59 (H) 03/04/2017 1426   ALT 36 08/19/2014 1038   ALKPHOS 63 03/04/2017 1426   ALKPHOS 81 08/19/2014 1038   BILITOT 1.1 03/04/2017 1426   BILITOT 0.5 08/19/2014 1038   GFRNONAA >60 03/04/2017 1426   GFRNONAA 59 (L) 08/19/2014 1038   GFRNONAA >60 03/16/2014 0911   GFRAA >60 03/04/2017 1426   GFRAA >60 08/19/2014 1038   GFRAA >60 03/16/2014 0911    No results found for: SPEP, UPEP  Lab Results  Component Value Date   WBC 6.3 03/04/2017   NEUTROABS 4.7 03/04/2017   HGB 13.5 03/04/2017   HCT 38.2 03/04/2017   MCV 90.2 03/04/2017   PLT 191 03/04/2017      Chemistry      Component Value Date/Time   NA 136 03/04/2017 1426   NA 141 08/19/2014 1038   K 3.9 03/04/2017 1426   K 4.3 08/19/2014 1038   CL 101 03/04/2017 1426   CL 103 08/19/2014 1038   CO2 28 03/04/2017 1426   CO2 29 08/19/2014 1038   BUN 10 03/04/2017 1426   BUN 9 08/19/2014 1038   CREATININE 1.04 (H) 03/04/2017 1426   CREATININE  1.07 08/19/2014 1038      Component Value Date/Time   CALCIUM 8.8 (L) 03/04/2017 1426   CALCIUM 8.6 08/19/2014 1038   ALKPHOS 63 03/04/2017 1426   ALKPHOS 81 08/19/2014 1038   AST 62 (H) 03/04/2017 1426   AST 18 08/19/2014 1038   ALT 59 (H) 03/04/2017 1426   ALT 36 08/19/2014 1038   BILITOT 1.1 03/04/2017 1426   BILITOT 0.5 08/19/2014 1038     IMPRESSION: 1. Stable exam.  No new or progressive findings. 2. Retrocaval and left iliac lymph nodes are stable since prior study. 3. Cholelithiasis. 4. Nonobstructing tiny bilateral renal stones. 5. Hepatic steatosis. 6.  Aortic Atherosclerois (ICD10-170.0)   Electronically Signed   By: Misty Stanley M.D.   On: 02/02/2017 11:56  RADIOGRAPHIC STUDIES: I have personally reviewed the radiological images as listed and agreed with the findings in the report. No results found.   ASSESSMENT & PLAN:  Malignant lymphoma, large cell, diffuse ([place]) #  Diffuse large B-cell lymphoma status post autologous transplant 2013;  July 10 th 2018- CT C/A/P- NED; except for STABLE subcm left pelvic LN. no clinical evidence of recurrence/progression. Reviewed the note from [facility] bone marrow transplant team. Patient is recommended follow up only as needed.  # Abdominal bloating unclear etiology- patient already on PPI; LFTs slightly elevated [clinically not significant- likely secondary to fatty liver]. Recommend referral to GI  # Risk of secondary malignancies- given the history of stem cell transplant- risk is fairly small. Recommend regular skin checkups; screening colonoscopies. Recent mammogram negative.   # severe Low back pain-MRI lumbar spine normal- in July 2018. Improved On NSAIDs.   # cont Port flush every 2 months; follow up in 4 months.     Orders Placed This Encounter  Procedures  . CBC with Differential/Platelet    Standing Status:   Future  Standing Expiration Date:   04/09/2018  . Comprehensive metabolic panel     Standing Status:   Future    Standing Expiration Date:   04/09/2018  . Ambulatory referral to Gastroenterology    Referral Priority:   Routine    Referral Type:   Consultation    Referral Reason:   Specialty Services Required    Referred to Provider:   Lucilla Lame, MD    Number of Visits Requested:   1   All questions were answered. The patient knows to call the clinic with any problems, questions or concerns.      Cammie Sickle, MD 04/09/2017 3:41 PM

## 2017-04-09 NOTE — Assessment & Plan Note (Addendum)
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 no clinical evidence of recurrence/progression. Reviewed the note from [facility] bone marrow transplant team. Patient is recommended follow up only as needed.  # Abdominal bloating unclear etiology- patient already on PPI; LFTs slightly elevated [clinically not significant- likely secondary to fatty liver]. Recommend referral to GI  # Risk of secondary malignancies- given the history of stem cell transplant- risk is fairly small. Recommend regular skin checkups; screening colonoscopies. Recent mammogram negative.   # severe Low back pain-MRI lumbar spine normal- in July 2018. Improved On NSAIDs.   # cont Port flush every 2 months; follow up in 4 months.

## 2017-04-11 ENCOUNTER — Encounter: Payer: Self-pay | Admitting: Gastroenterology

## 2017-05-07 ENCOUNTER — Other Ambulatory Visit: Payer: Self-pay

## 2017-05-07 ENCOUNTER — Encounter: Payer: Self-pay | Admitting: Gastroenterology

## 2017-05-07 ENCOUNTER — Ambulatory Visit (INDEPENDENT_AMBULATORY_CARE_PROVIDER_SITE_OTHER): Payer: BLUE CROSS/BLUE SHIELD | Admitting: Gastroenterology

## 2017-05-07 VITALS — BP 134/86 | HR 106 | Temp 97.9°F | Ht 68.0 in | Wt 221.6 lb

## 2017-05-07 DIAGNOSIS — K580 Irritable bowel syndrome with diarrhea: Secondary | ICD-10-CM

## 2017-05-07 NOTE — Progress Notes (Signed)
Cephas Darby, MD [address]  [place]  [place], [place] [address]  Main: [phone]  Fax: [phone]    Gastroenterology Consultation  Referring Provider:     No ref. provider found Primary Care Physician:  Patient, No Pcp Per Primary Gastroenterologist:  Dr. Cephas Darby Reason for Consultation:    Nonspecific Abdominal pain        HPI:   Bethany Kelly is a 46 y.o. y/o female referred by Dr. Patient, No Pcp Per  for consultation & management of chronic abdominal pain. 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 She has 5 years history of chronic central abdominal discomfort, mild, associated with 2-3 very soft, nonbloody bowel movements. She does have intermittent bloating. 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 She is currently about 220 pounds. She denies any food triggers for her symptoms. She denies any other upper GI symptoms.she takes probiotics She does have mildly elevated transaminases based on labs from 02/2017  She denies tobacco or alcohol. Reports occasional NSAID use She is adopted She had history of uterine and rectal prolapse from vaginal area, underwent hysterectomy.  GI Procedures: None  Past Medical History:  Diagnosis Date  . Anemia   . Anxiety   . Chronic kidney disease    kidney stones  . Chronic sinusitis   . Diffuse large B cell lymphoma ([place]) 2013  . Fatigue   . GERD (gastroesophageal reflux disease)   . Hypertension    past history of hypertension  . Hypoalbuminemia   . Personal history of chemotherapy June 2013 , and  March 2014  . Weight loss     Past Surgical History:  Procedure Laterality Date  . ABDOMINAL HYSTERECTOMY    . bladder tacking    .  INGUINAL LYMPH NODE BIOPSY Right 04/21/2016    INGUINAL LYMPH NODE BIOPSY;  REACTIVE LYMPHADENOPATHY.  Robert Bellow, MD;  [facility] ORS;General;  Laterality: Right;  . 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 PORTACATH PLACEMENT  2013  . RECTAL PROLAPSE REPAIR    . TUBAL LIGATION      Prior to Admission medications   Medication Sig Start Date End Date Taking? Authorizing Provider  cyclobenzaprine (FLEXERIL) 10 MG tablet cyclobenzaprine 10 mg tablet   Yes [provider]  flunisolide (NASALIDE) 25 MCG/ACT (0.025%) SOLN Place into the nose. 10/12/13  Yes [provider]  HYDROcodone-acetaminophen (NORCO/VICODIN) 5-325 MG tablet hydrocodone 5 mg-acetaminophen 325 mg tablet   Yes [provider]  hydrOXYzine (ATARAX/VISTARIL) 25 MG tablet hydroxyzine HCl 25 mg tablet   Yes [provider]  ibuprofen (ADVIL,MOTRIN) 600 MG tablet Take 1 tablet (600 mg total) by mouth every 6 (six) hours as needed. 02/11/17  Yes Sable Feil, PA-C  LACTOBACILLUS RHAMNOSUS, GG, PO Take by mouth.   Yes [provider]  meloxicam (MOBIC) 15 MG tablet  02/14/17  Yes [provider]  Multiple Vitamin (MULTI-VITAMINS) TABS Take 1 tablet by mouth daily.    Yes [provider]  naproxen sodium (ANAPROX) 550 MG tablet Take by mouth. 10/12/13  Yes [provider]  omeprazole (PRILOSEC) 20 MG capsule TAKE ONE CAPSULE BY MOUTH ONCE DAILY 12/08/16  Yes Cammie Sickle, MD  ondansetron (  ZOFRAN ODT) 4 MG disintegrating tablet Take 1 tablet (4 mg total) by mouth every 6 (six) hours as needed for nausea or vomiting. 04/07/16  Yes Cammie Sickle, MD  oxyCODONE-acetaminophen (PERCOCET) 7.5-325 MG tablet  02/12/17  Yes [provider]  pyridOXINE (VITAMIN B-6) 25 MG tablet Take 25 mg by mouth daily.    Yes [provider]  tamsulosin (FLOMAX) 0.4 MG CAPS capsule Take 1 capsule (0.4 mg total) by mouth daily. 01/10/15  Yes Schaevitz, Randall An, MD  triamcinolone ointment (KENALOG) 0.1 % triamcinolone acetonide 0.1 % topical ointment   Yes [provider]  Turmeric Curcumin 500 MG CAPS Take by mouth daily.    Yes [provider]  albuterol (PROVENTIL HFA;VENTOLIN HFA) 108 (90 Base) MCG/ACT inhaler Inhale 2 puffs into the lungs every 4 (four) hours as needed.  11/26/14   [provider]  citalopram (CELEXA) 10 MG tablet TAKE ONE TABLET BY MOUTH ONCE DAILY Patient not taking: Reported on 05/07/2017 08/28/16   Cammie Sickle, MD  Cyanocobalamin (RA VITAMIN B-12 TR) 1000 MCG TBCR Take 1 tablet by mouth daily.     [provider]  magnesium oxide (MAG-OX) 400 MG tablet Take by mouth.    [provider]    Family History  Problem Relation Age of Onset  . Adopted: Yes  . Family history unknown: Yes     Social History  Substance Use Topics  . Smoking status: Former Smoker    Packs/day: 1.00    Types: Cigarettes    Quit date: 12/16/1998  . Smokeless tobacco: Never Used  . Alcohol use Yes     Comment: Occassional    Allergies as of 05/07/2017 - Review Complete 05/07/2017  Allergen Reaction Noted  . Penicillins Shortness Of Breath, Swelling, and Anaphylaxis 11/17/2014    Review of Systems:    All systems reviewed and negative except where noted in HPI.   Physical Exam:  BP 134/86   Pulse (!) 106   Temp 97.9 F (36.6 C) (Oral)   Ht 5\' 8"  (1.727 m)   Wt 221 lb 9.6 oz (100.5 kg)   BMI 33.69 kg/m  No LMP recorded. Patient has had a hysterectomy.  General:   Alert,  Well-developed, well-nourished, pleasant and cooperative in NAD Head:  Normocephalic and atraumatic. Eyes:  Sclera clear, no icterus.   Conjunctiva pink. Ears:  Normal auditory acuity. Nose:  No deformity, discharge, or lesions. Mouth:  No deformity or lesions,oropharynx pink & moist. Neck:  Supple; no masses or thyromegaly. Lungs:  Respirations even and unlabored.  Clear throughout to auscultation.   No  wheezes, crackles, or rhonchi. No acute distress. Heart:  Regular rate and rhythm; no murmurs, clicks, rubs, or gallops. Abdomen:  Normal bowel sounds. Obese, Soft, non-tender and non-distended without masses, hepatosplenomegaly or hernias noted.  No guarding or rebound tenderness.   Rectal: Nor performed Msk:  Symmetrical without gross deformities. Good, equal movement & strength bilaterally. Pulses:  Normal pulses noted. Extremities:  No clubbing or edema.  No cyanosis. Neurologic:  Alert and oriented x3;  grossly normal neurologically. Skin:  Intact without significant lesions or rashes. No jaundice. Psych:  Alert and cooperative. Normal mood and affect.  Imaging Studies: CT A/P from 01/2017 showed hepatic steatosis  Assessment and Plan:   KIORA HALLBERG is a 46 y.o. 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 Her mildly elevated transaminases are secondary to hepatic steatosis.   - trial of low FODMAPs diet - IB-guard - encouraged healthy lifestyle with diet and exercise - if symptoms persist, recommend colonoscopy in 3-6 months - recheck LFTs in 6 months  Follow up in 3 months   Cephas Darby, MD

## 2017-06-11 ENCOUNTER — Inpatient Hospital Stay: Payer: BLUE CROSS/BLUE SHIELD | Attending: Internal Medicine

## 2017-06-11 DIAGNOSIS — Z452 Encounter for adjustment and management of vascular access device: Secondary | ICD-10-CM | POA: Insufficient documentation

## 2017-06-11 DIAGNOSIS — Z9221 Personal history of antineoplastic chemotherapy: Secondary | ICD-10-CM | POA: Diagnosis not present

## 2017-06-11 DIAGNOSIS — Z8572 Personal history of non-Hodgkin lymphomas: Secondary | ICD-10-CM | POA: Insufficient documentation

## 2017-06-11 DIAGNOSIS — Z95828 Presence of other vascular implants and grafts: Secondary | ICD-10-CM

## 2017-06-11 MED ORDER — SODIUM CHLORIDE 0.9% FLUSH
10.0000 mL | INTRAVENOUS | Status: AC | PRN
  Administered 2017-06-11: 10 mL
  Filled 2017-06-11: qty 10

## 2017-06-11 MED ORDER — HEPARIN SOD (PORK) LOCK FLUSH 100 UNIT/ML IV SOLN
500.0000 [IU] | INTRAVENOUS | Status: AC | PRN
  Administered 2017-06-11: 500 [IU]

## 2017-08-06 ENCOUNTER — Encounter: Payer: Self-pay | Admitting: Internal Medicine

## 2017-08-06 ENCOUNTER — Inpatient Hospital Stay: Payer: BLUE CROSS/BLUE SHIELD | Admitting: Internal Medicine

## 2017-08-06 ENCOUNTER — Other Ambulatory Visit: Payer: Self-pay | Admitting: *Deleted

## 2017-08-06 ENCOUNTER — Inpatient Hospital Stay: Payer: BLUE CROSS/BLUE SHIELD | Attending: Internal Medicine

## 2017-08-06 VITALS — BP 130/87 | HR 80 | Temp 97.4°F | Resp 16 | Wt 214.0 lb

## 2017-08-06 DIAGNOSIS — K219 Gastro-esophageal reflux disease without esophagitis: Secondary | ICD-10-CM | POA: Insufficient documentation

## 2017-08-06 DIAGNOSIS — Z8572 Personal history of non-Hodgkin lymphomas: Secondary | ICD-10-CM | POA: Diagnosis not present

## 2017-08-06 DIAGNOSIS — Z87442 Personal history of urinary calculi: Secondary | ICD-10-CM | POA: Insufficient documentation

## 2017-08-06 DIAGNOSIS — N189 Chronic kidney disease, unspecified: Secondary | ICD-10-CM

## 2017-08-06 DIAGNOSIS — I129 Hypertensive chronic kidney disease with stage 1 through stage 4 chronic kidney disease, or unspecified chronic kidney disease: Secondary | ICD-10-CM | POA: Diagnosis not present

## 2017-08-06 DIAGNOSIS — Z79899 Other long term (current) drug therapy: Secondary | ICD-10-CM | POA: Diagnosis not present

## 2017-08-06 DIAGNOSIS — C8337 Diffuse large B-cell lymphoma, spleen: Secondary | ICD-10-CM

## 2017-08-06 DIAGNOSIS — Z880 Allergy status to penicillin: Secondary | ICD-10-CM | POA: Insufficient documentation

## 2017-08-06 DIAGNOSIS — Z95828 Presence of other vascular implants and grafts: Secondary | ICD-10-CM

## 2017-08-06 DIAGNOSIS — Z9221 Personal history of antineoplastic chemotherapy: Secondary | ICD-10-CM | POA: Diagnosis not present

## 2017-08-06 DIAGNOSIS — Z87891 Personal history of nicotine dependence: Secondary | ICD-10-CM | POA: Insufficient documentation

## 2017-08-06 DIAGNOSIS — M545 Low back pain: Secondary | ICD-10-CM | POA: Insufficient documentation

## 2017-08-06 DIAGNOSIS — C8330 Diffuse large B-cell lymphoma, unspecified site: Secondary | ICD-10-CM

## 2017-08-06 LAB — CBC WITH DIFFERENTIAL/PLATELET
Basophils Absolute: 0 10*3/uL (ref 0–0.1)
Basophils Relative: 1 %
Eosinophils Absolute: 0.1 10*3/uL (ref 0–0.7)
Eosinophils Relative: 1 %
HCT: 40.2 % (ref 35.0–47.0)
Hemoglobin: 13.6 g/dL (ref 12.0–16.0)
Lymphocytes Relative: 30 %
Lymphs Abs: 2.1 10*3/uL (ref 1.0–3.6)
MCH: 30.4 pg (ref 26.0–34.0)
MCHC: 33.8 g/dL (ref 32.0–36.0)
MCV: 89.9 fL (ref 80.0–100.0)
Monocytes Absolute: 0.4 10*3/uL (ref 0.2–0.9)
Monocytes Relative: 5 %
Neutro Abs: 4.3 10*3/uL (ref 1.4–6.5)
Neutrophils Relative %: 63 %
Platelets: 228 10*3/uL (ref 150–440)
RBC: 4.47 MIL/uL (ref 3.80–5.20)
RDW: 12.1 % (ref 11.5–14.5)
WBC: 6.9 10*3/uL (ref 3.6–11.0)

## 2017-08-06 LAB — COMPREHENSIVE METABOLIC PANEL
ALT: 24 U/L (ref 14–54)
AST: 29 U/L (ref 15–41)
Albumin: 4.2 g/dL (ref 3.5–5.0)
Alkaline Phosphatase: 61 U/L (ref 38–126)
Anion gap: 8 (ref 5–15)
BUN: 11 mg/dL (ref 6–20)
CO2: 28 mmol/L (ref 22–32)
Calcium: 8.8 mg/dL — ABNORMAL LOW (ref 8.9–10.3)
Chloride: 102 mmol/L (ref 101–111)
Creatinine, Ser: 0.9 mg/dL (ref 0.44–1.00)
GFR calc Af Amer: >60 mL/min (ref >60)
GFR calc non Af Amer: >60 mL/min (ref >60)
Glucose, Bld: 97 mg/dL (ref 65–99)
Potassium: 3.9 mmol/L (ref 3.5–5.1)
Sodium: 138 mmol/L (ref 135–145)
Total Bilirubin: 0.6 mg/dL (ref 0.3–1.2)
Total Protein: 7 g/dL (ref 6.5–8.1)

## 2017-08-06 MED ORDER — SODIUM CHLORIDE 0.9% FLUSH
10.0000 mL | INTRAVENOUS | Status: AC | PRN
  Filled 2017-08-06: qty 10

## 2017-08-06 MED ORDER — SODIUM CHLORIDE 0.9% FLUSH
10.0000 mL | INTRAVENOUS | Status: AC | PRN
  Administered 2017-08-06: 10 mL
  Filled 2017-08-06: qty 10

## 2017-08-06 MED ORDER — HEPARIN SOD (PORK) LOCK FLUSH 100 UNIT/ML IV SOLN
500.0000 [IU] | INTRAVENOUS | Status: AC | PRN
  Administered 2017-08-06: 500 [IU]

## 2017-08-06 MED ORDER — HEPARIN SOD (PORK) LOCK FLUSH 100 UNIT/ML IV SOLN: 500.0000 [IU] | INTRAVENOUS | Status: AC | PRN

## 2017-08-06 NOTE — Assessment & Plan Note (Addendum)
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   #Clinically no evidence of recurrence.  # Abdominal bloating unclear etiology- s/p GI eval.  # severe Low back pain-MRI lumbar spine normal- in July 2018. Improved On NSAIDs.   # port flush-can be taken out. Make a referral to IR for explantation.   # follow up in 6 months/labs. No imaging discussed with pt.

## 2017-08-06 NOTE — Progress Notes (Signed)
[facility] OFFICE PROGRESS NOTE  Patient Care Team: Patient, No Pcp Per as PCP - General ([place]) Cammie Sickle, MD as Consulting Physician (Internal Medicine) Bary Castilla Forest Gleason, MD (General Surgery)  Cancer Staging No matching staging information was found for the patient.   Oncology History   Chief Complaint/Diagnosis:   # 2013-  admitted with hypercalcemia and abnormal CT scan of the abdomen  2. 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 Patient was started on RCHOP chemotherapy June of 2013 4. High-dose methotrexate in alternate cycles starting from July 29. 5.last dose of all chemotherapy with R. CHOP (April 29, 2012) patient received total 6 cycles of chemotherapy and tolerated treatment very well without any significant side effect 6.and underwent high dose chemotherapy and stem cell support High-dose chemotherapy with BEAM regimen.  (March 24: 014  # SEP 13th 2017- ~1.2cm R Ingiunal LN; 33mm ex Iliac LN ? Recurrence; RIGHT INGUINAL LN excisional Bx- NEGATIVE [Dr.Byrnett]; JAN 2018- CT NED; sub cm LN     DLBCL (diffuse large B cell lymphoma) ([place])   10/28/2012 Initial Diagnosis    Malignant lymphoma, large cell, diffuse (HCC)       Diffuse large B-cell lymphoma of spleen ([place])   09/27/2015 Initial Diagnosis    Diffuse large B-cell lymphoma of spleen (HCC)        INTERVAL HISTORY:  Bethany Kelly 47 y.o.  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   Patient's back pain has improved.  Patient denies any weight loss.Denies any unusual headaches.  No night sweats or weight loss or any numbness.   REVIEW OF SYSTEMS:  A complete 10 point review of system is done which is negative  except mentioned above/history of present illness.   PAST MEDICAL HISTORY :  Past Medical History:  Diagnosis Date  . Anemia   . Anxiety   . Chronic kidney disease    kidney stones  . Chronic sinusitis   . Diffuse large B cell lymphoma ([place]) 2013  . Fatigue   . GERD (gastroesophageal reflux disease)   . Hypertension    past history of hypertension  . Hypoalbuminemia   . Personal history of chemotherapy June 2013 , and  March 2014  . Weight loss     PAST SURGICAL HISTORY :   Past Surgical History:  Procedure Laterality Date  . ABDOMINAL HYSTERECTOMY    . bladder tacking    . INGUINAL LYMPH NODE BIOPSY Right 04/21/2016    INGUINAL LYMPH NODE BIOPSY;  REACTIVE LYMPHADENOPATHY.  Robert Bellow, MD;  [facility] ORS;General;  Laterality: Right;  . 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 PORTACATH PLACEMENT  2013  . RECTAL PROLAPSE REPAIR    . TUBAL LIGATION      FAMILY HISTORY :   Family History  Adopted: Yes  Family history unknown: Yes    SOCIAL HISTORY:   Social History   Tobacco Use  . Smoking status: Former Smoker    Packs/day: 1.00    Types: Cigarettes    Last attempt to quit: 12/16/1998    Years since quitting: 18.6  . Smokeless tobacco: Never Used  Substance Use Topics  . Alcohol use: Yes    Comment: Occassional  . Drug use: No  ALLERGIES:  is allergic to penicillins.  MEDICATIONS:  Current Outpatient Medications  Medication Sig Dispense Refill  . albuterol (PROVENTIL HFA;VENTOLIN HFA) 108 (90 Base) MCG/ACT inhaler Inhale 2 puffs into the lungs every 4 (four) hours as needed.     . citalopram (CELEXA) 10 MG tablet TAKE ONE TABLET BY MOUTH ONCE DAILY 30 tablet 6  . Cyanocobalamin (RA VITAMIN B-12 TR) 1000 MCG TBCR Take 1 tablet by mouth daily.     . cyclobenzaprine (FLEXERIL) 10 MG tablet cyclobenzaprine 10 mg tablet    . flunisolide (NASALIDE) 25 MCG/ACT (0.025%) SOLN Place into the nose.    Marland Kitchen HYDROcodone-acetaminophen (NORCO/VICODIN) 5-325 MG tablet  hydrocodone 5 mg-acetaminophen 325 mg tablet    . hydrOXYzine (ATARAX/VISTARIL) 25 MG tablet hydroxyzine HCl 25 mg tablet    . ibuprofen (ADVIL,MOTRIN) 600 MG tablet Take 1 tablet (600 mg total) by mouth every 6 (six) hours as needed. 30 tablet 0  . LACTOBACILLUS RHAMNOSUS, GG, PO Take by mouth.    . magnesium oxide (MAG-OX) 400 MG tablet Take by mouth.    . meloxicam (MOBIC) 15 MG tablet   0  . Multiple Vitamin (MULTI-VITAMINS) TABS Take 1 tablet by mouth daily.     . naproxen sodium (ANAPROX) 550 MG tablet Take by mouth.    Marland Kitchen omeprazole (PRILOSEC) 20 MG capsule TAKE ONE CAPSULE BY MOUTH ONCE DAILY 30 capsule 3  . ondansetron (ZOFRAN ODT) 4 MG disintegrating tablet Take 1 tablet (4 mg total) by mouth every 6 (six) hours as needed for nausea or vomiting. 30 tablet 6  . oxyCODONE-acetaminophen (PERCOCET) 7.5-325 MG tablet   0  . pyridOXINE (VITAMIN B-6) 25 MG tablet Take 25 mg by mouth daily.     . tamsulosin (FLOMAX) 0.4 MG CAPS capsule Take 1 capsule (0.4 mg total) by mouth daily. 30 capsule 0  . triamcinolone ointment (KENALOG) 0.1 % triamcinolone acetonide 0.1 % topical ointment    . Turmeric Curcumin 500 MG CAPS Take by mouth daily.      No current facility-administered medications for this visit.    Facility-Administered Medications Ordered in Other Visits  Medication Dose Route Frequency Provider Last Rate Last Dose  . heparin lock flush 100 unit/mL  500 Units Intracatheter PRN Charlaine Dalton R, MD      . sodium chloride flush (NS) 0.9 % injection 10 mL  10 mL Intravenous PRN Charlaine Dalton R, MD      . sodium chloride flush (NS) 0.9 % injection 10 mL  10 mL Intracatheter PRN Cammie Sickle, MD        PHYSICAL EXAMINATION: ECOG PERFORMANCE STATUS: 0 - Asymptomatic  BP 130/87 (BP Location: Left Arm, Patient Position: Sitting)   Pulse 80   Temp (!) 97.4 F (36.3 C) (Tympanic)   Resp 16   Wt 214 lb (97.1 kg)   BMI 32.54 kg/m   Filed Weights   08/06/17 1412   Weight: 214 lb (97.1 kg)    GENERAL: Well-nourished well-developed; Alert, no distress and comfortable. Accompanied by family.  EYES: no pallor or icterus OROPHARYNX: no thrush or ulceration; good dentition  NECK: supple, no masses felt LYMPH:  no palpable lymphadenopathy in the cervical, axillary or inguinal regions LUNGS: clear to auscultation and  No wheeze or crackles HEART/CVS: regular rate & rhythm and no murmurs; No lower extremity edema ABDOMEN:abdomen soft, non-tender and normal bowel sounds Musculoskeletal:no cyanosis of digits and no clubbing  PSYCH: alert & oriented x 3 with fluent speech NEURO: no focal  motor/sensory deficits SKIN:  No rash.   LABORATORY DATA:  I have reviewed the data as listed    Component Value Date/Time   NA 138 08/06/2017 1350   NA 141 08/19/2014 1038   K 3.9 08/06/2017 1350   K 4.3 08/19/2014 1038   CL 102 08/06/2017 1350   CL 103 08/19/2014 1038   CO2 28 08/06/2017 1350   CO2 29 08/19/2014 1038   GLUCOSE 97 08/06/2017 1350   GLUCOSE 103 (H) 08/19/2014 1038   BUN 11 08/06/2017 1350   BUN 9 08/19/2014 1038   CREATININE 0.90 08/06/2017 1350   CREATININE 1.07 08/19/2014 1038   CALCIUM 8.8 (L) 08/06/2017 1350   CALCIUM 8.6 08/19/2014 1038   PROT 7.0 08/06/2017 1350   PROT 7.0 08/19/2014 1038   ALBUMIN 4.2 08/06/2017 1350   ALBUMIN 3.7 08/19/2014 1038   AST 29 08/06/2017 1350   AST 18 08/19/2014 1038   ALT 24 08/06/2017 1350   ALT 36 08/19/2014 1038   ALKPHOS 61 08/06/2017 1350   ALKPHOS 81 08/19/2014 1038   BILITOT 0.6 08/06/2017 1350   BILITOT 0.5 08/19/2014 1038   GFRNONAA >60 08/06/2017 1350   GFRNONAA 59 (L) 08/19/2014 1038   GFRNONAA >60 03/16/2014 0911   GFRAA >60 08/06/2017 1350   GFRAA >60 08/19/2014 1038   GFRAA >60 03/16/2014 0911    No results found for: SPEP, UPEP  Lab Results  Component Value Date   WBC 6.9 08/06/2017   NEUTROABS 4.3 08/06/2017   HGB 13.6 08/06/2017   HCT 40.2 08/06/2017   MCV 89.9  08/06/2017   PLT 228 08/06/2017      Chemistry      Component Value Date/Time   NA 138 08/06/2017 1350   NA 141 08/19/2014 1038   K 3.9 08/06/2017 1350   K 4.3 08/19/2014 1038   CL 102 08/06/2017 1350   CL 103 08/19/2014 1038   CO2 28 08/06/2017 1350   CO2 29 08/19/2014 1038   BUN 11 08/06/2017 1350   BUN 9 08/19/2014 1038   CREATININE 0.90 08/06/2017 1350   CREATININE 1.07 08/19/2014 1038      Component Value Date/Time   CALCIUM 8.8 (L) 08/06/2017 1350   CALCIUM 8.6 08/19/2014 1038   ALKPHOS 61 08/06/2017 1350   ALKPHOS 81 08/19/2014 1038   AST 29 08/06/2017 1350   AST 18 08/19/2014 1038   ALT 24 08/06/2017 1350   ALT 36 08/19/2014 1038   BILITOT 0.6 08/06/2017 1350   BILITOT 0.5 08/19/2014 1038     IMPRESSION: 1. Stable exam.  No new or progressive findings. 2. Retrocaval and left iliac lymph nodes are stable since prior study. 3. Cholelithiasis. 4. Nonobstructing tiny bilateral renal stones. 5. Hepatic steatosis. 6.  Aortic Atherosclerois (ICD10-170.0)   Electronically Signed   By: Misty Stanley M.D.   On: 02/02/2017 11:56  RADIOGRAPHIC STUDIES: I have personally reviewed the radiological images as listed and agreed with the findings in the report. No results found.   ASSESSMENT & PLAN:  Diffuse large B-cell lymphoma of spleen ([place]) #  Diffuse large B-cell lymphoma status post autologous transplant 2013;  July 10 th 2018- CT C/A/P- NED; except for STABLE subcm left pelvic LN.   #Clinically no evidence of recurrence.  # Abdominal bloating unclear etiology- s/p GI eval.  # severe Low back pain-MRI lumbar spine normal- in July 2018. Improved On NSAIDs.   # port flush-can be taken out. Make a referral to IR for explantation.   #  follow up in 6 months/labs. No imaging discussed with pt.    Orders Placed This Encounter  Procedures  . Ambulatory referral to Vascular Surgery    Referral Priority:   Routine    Referral Type:   Surgical    Referral  Reason:   Specialty Services Required    Referred to Provider:   Algernon Huxley, MD    Requested Specialty:   Vascular Surgery    Number of Visits Requested:   1   All questions were answered. The patient knows to call the clinic with any problems, questions or concerns.      Cammie Sickle, MD 08/21/2017 8:17 PM

## 2017-08-07 ENCOUNTER — Telehealth (INDEPENDENT_AMBULATORY_CARE_PROVIDER_SITE_OTHER): Payer: Self-pay

## 2017-08-07 NOTE — Telephone Encounter (Signed)
Attempted to contact the patient yesterday and today. The mailbox is full so I was unable to leave a message.

## 2017-08-21 ENCOUNTER — Encounter (INDEPENDENT_AMBULATORY_CARE_PROVIDER_SITE_OTHER): Payer: Self-pay | Admitting: Vascular Surgery

## 2017-08-21 ENCOUNTER — Ambulatory Visit (INDEPENDENT_AMBULATORY_CARE_PROVIDER_SITE_OTHER): Payer: BLUE CROSS/BLUE SHIELD | Admitting: Vascular Surgery

## 2017-08-21 VITALS — BP 129/92 | HR 79 | Resp 17 | Ht 68.0 in | Wt 215.0 lb

## 2017-08-21 DIAGNOSIS — C8337 Diffuse large B-cell lymphoma, spleen: Secondary | ICD-10-CM | POA: Diagnosis not present

## 2017-08-21 DIAGNOSIS — I10 Essential (primary) hypertension: Secondary | ICD-10-CM

## 2017-08-21 NOTE — Progress Notes (Addendum)
Patient ID: Bethany Kelly, female   DOB: 01/16/1971, 47 y.o.   MRN: [id number]  Chief Complaint  Patient presents with  . New Patient (Initial Visit)    discuss port removal    HPI Bethany Kelly is a 47 y.o. female.  I am asked to see the patient by Dr. Rogue Bussing for evaluation of port removal.  The patient reports he has history of lymphoma with a port placement about 5 years ago at another institution.  She has not used her port for some time and this has been getting flushed about every 6-8 weeks to keep its patency.  Her oncologist has discussed and recommended removal at this time.  The port is not really bothersome to her.  She denies fever or chills.   Past Medical History:  Diagnosis Date  . Anemia   . Anxiety   . Chronic kidney disease    kidney stones  . Chronic sinusitis   . Diffuse large B cell lymphoma ([place]) 2013  . Fatigue   . GERD (gastroesophageal reflux disease)   . Hypertension    past history of hypertension  . Hypoalbuminemia   . Personal history of chemotherapy June 2013 , and  March 2014  . Weight loss     Past Surgical History:  Procedure Laterality Date  . ABDOMINAL HYSTERECTOMY    . bladder tacking    . INGUINAL LYMPH NODE BIOPSY Right 04/21/2016    INGUINAL LYMPH NODE BIOPSY;  REACTIVE LYMPHADENOPATHY.  Bethany Bellow, MD;  [facility] ORS;General;  Laterality: Right;  . 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 PORTACATH PLACEMENT  2013  . RECTAL PROLAPSE REPAIR    . TUBAL LIGATION      Family History  Adopted: Yes  Family history unknown: Yes     Social History Social History   Tobacco Use  . Smoking status: Former Smoker    Packs/day: 1.00    Types: Cigarettes    Last attempt to quit: 12/16/1998    Years since quitting: 18.6  . Smokeless tobacco: Never Used  Substance Use Topics  . Alcohol use: Yes    Comment: Occassional  . Drug use: No     Allergies  Allergen Reactions  . Penicillins Shortness Of Breath, Swelling and  Anaphylaxis    "swelling of throat."    Current Outpatient Medications  Medication Sig Dispense Refill  . albuterol (PROVENTIL HFA;VENTOLIN HFA) 108 (90 Base) MCG/ACT inhaler Inhale 2 puffs into the lungs every 4 (four) hours as needed.     . citalopram (CELEXA) 10 MG tablet TAKE ONE TABLET BY MOUTH ONCE DAILY 30 tablet 6  . Cyanocobalamin (RA VITAMIN B-12 TR) 1000 MCG TBCR Take 1 tablet by mouth daily.     . cyclobenzaprine (FLEXERIL) 10 MG tablet cyclobenzaprine 10 mg tablet    . flunisolide (NASALIDE) 25 MCG/ACT (0.025%) SOLN Place into the nose.    Marland Kitchen HYDROcodone-acetaminophen (NORCO/VICODIN) 5-325 MG tablet hydrocodone 5 mg-acetaminophen 325 mg tablet    . hydrOXYzine (ATARAX/VISTARIL) 25 MG tablet hydroxyzine HCl 25 mg tablet    . ibuprofen (ADVIL,MOTRIN) 600 MG tablet Take 1 tablet (600 mg total) by mouth every 6 (six) hours as needed. 30 tablet 0  . LACTOBACILLUS RHAMNOSUS, GG, PO Take by mouth.    . magnesium oxide (MAG-OX) 400 MG tablet Take by mouth.    . meloxicam (MOBIC) 15 MG tablet   0  . Multiple Vitamin (MULTI-VITAMINS) TABS Take 1 tablet by mouth daily.     Marland Kitchen  naproxen sodium (ANAPROX) 550 MG tablet Take by mouth.    Marland Kitchen omeprazole (PRILOSEC) 20 MG capsule TAKE ONE CAPSULE BY MOUTH ONCE DAILY 30 capsule 3  . ondansetron (ZOFRAN ODT) 4 MG disintegrating tablet Take 1 tablet (4 mg total) by mouth every 6 (six) hours as needed for nausea or vomiting. 30 tablet 6  . oxyCODONE-acetaminophen (PERCOCET) 7.5-325 MG tablet   0  . pyridOXINE (VITAMIN B-6) 25 MG tablet Take 25 mg by mouth daily.     . tamsulosin (FLOMAX) 0.4 MG CAPS capsule Take 1 capsule (0.4 mg total) by mouth daily. 30 capsule 0  . triamcinolone ointment (KENALOG) 0.1 % triamcinolone acetonide 0.1 % topical ointment    . Turmeric Curcumin 500 MG CAPS Take by mouth daily.      No current facility-administered medications for this visit.    Facility-Administered Medications Ordered in Other Visits  Medication Dose  Route Frequency Provider Last Rate Last Dose  . heparin lock flush 100 unit/mL  500 Units Intracatheter PRN Charlaine Dalton R, MD      . sodium chloride flush (NS) 0.9 % injection 10 mL  10 mL Intravenous PRN Charlaine Dalton R, MD      . sodium chloride flush (NS) 0.9 % injection 10 mL  10 mL Intracatheter PRN Cammie Sickle, MD          REVIEW OF SYSTEMS (Negative unless checked)  Constitutional: '[x]' Weight loss  '[]' Fever  '[]' Chills Cardiac: '[]' Chest pain   '[]' Chest pressure   '[]' Palpitations   '[]' Shortness of breath when laying flat   '[]' Shortness of breath at rest   '[]' Shortness of breath with exertion. Vascular:  '[]' Pain in legs with walking   '[]' Pain in legs at rest   '[]' Pain in legs when laying flat   '[]' Claudication   '[]' Pain in feet when walking  '[]' Pain in feet at rest  '[]' Pain in feet when laying flat   '[]' History of DVT   '[]' Phlebitis   '[]' Swelling in legs   '[]' Varicose veins   '[]' Non-healing ulcers Pulmonary:   '[]' Uses home oxygen   '[]' Productive cough   '[]' Hemoptysis   '[]' Wheeze  '[]' COPD   '[]' Asthma Neurologic:  '[]' Dizziness  '[]' Blackouts   '[]' Seizures   '[]' History of stroke   '[]' History of TIA  '[]' Aphasia   '[]' Temporary blindness   '[]' Dysphagia   '[]' Weakness or numbness in arms   '[]' Weakness or numbness in legs Musculoskeletal:  '[x]' Arthritis   '[]' Joint swelling   '[]' Joint pain   '[]' Low back pain Hematologic:  '[]' Easy bruising  '[]' Easy bleeding   '[]' Hypercoagulable state   '[x]' Anemic  '[]' Hepatitis Gastrointestinal:  '[]' Blood in stool   '[]' Vomiting blood  '[x]' Gastroesophageal reflux/heartburn   '[]' Abdominal pain Genitourinary:  '[]' Chronic kidney disease   '[]' Difficult urination  '[]' Frequent urination  '[]' Burning with urination   '[]' Hematuria Skin:  '[]' Rashes   '[]' Ulcers   '[]' Wounds Psychological:  '[x]' History of anxiety   '[x]'  History of major depression.    Physical Exam BP (!) 129/92 (BP Location: Right Arm)   Pulse 79   Resp 17   Ht '5\' 8"'  (1.727 m)   Wt 97.5 kg (215 lb)   BMI 32.69 kg/m  Gen:  WD/WN, NAD Head:  [place]/AT, No temporalis wasting.  Ear/Nose/Throat: Hearing grossly intact, nares w/o erythema or drainage, oropharynx w/o Erythema/Exudate Eyes: Conjunctiva clear, sclera non-icteric  Neck: trachea midline. Port entering right  Jugular vein Pulmonary:  Good air movement, respirations not labored, no use of accessory muscles Cardiac: RRR, no JVD Vascular: port in right chest.  No  erythema Vessel Right Left  Radial Palpable Palpable                                    Musculoskeletal: M/S 5/5 throughout.  Extremities without ischemic changes.  No deformity or atrophy.  Neurologic: Sensation grossly intact in extremities.  Symmetrical.  Speech is fluent. Motor exam as listed above. Psychiatric: Judgment intact, Mood & affect appropriate for pt's clinical situation. Dermatologic: No rashes or ulcers noted.  No cellulitis or open wounds.    Radiology No results found.  Labs Recent Results (from the past 2160 hour(s))  Comprehensive metabolic panel     Status: Abnormal   Collection Time: 08/06/17  1:50 PM  Result Value Ref Range   Sodium 138 135 - 145 mmol/L   Potassium 3.9 3.5 - 5.1 mmol/L   Chloride 102 101 - 111 mmol/L   CO2 28 22 - 32 mmol/L   Glucose, Bld 97 65 - 99 mg/dL   BUN 11 6 - 20 mg/dL   Creatinine, Ser 0.90 0.44 - 1.00 mg/dL   Calcium 8.8 (L) 8.9 - 10.3 mg/dL   Total Protein 7.0 6.5 - 8.1 g/dL   Albumin 4.2 3.5 - 5.0 g/dL   AST 29 15 - 41 U/L   ALT 24 14 - 54 U/L   Alkaline Phosphatase 61 38 - 126 U/L   Total Bilirubin 0.6 0.3 - 1.2 mg/dL   GFR calc non Af Amer >60 >60 mL/min   GFR calc Af Amer >60 >60 mL/min    Comment: (NOTE) The eGFR has been calculated using the CKD EPI equation. This calculation has not been validated in all clinical situations. eGFR's persistently <60 mL/min signify possible Chronic Kidney Disease.    Anion gap 8 5 - 15    Comment: Performed at [facility], [address] [place], [place] [address]  CBC with  Differential/Platelet     Status: None   Collection Time: 08/06/17  1:50 PM  Result Value Ref Range   WBC 6.9 3.6 - 11.0 K/uL   RBC 4.47 3.80 - 5.20 MIL/uL   Hemoglobin 13.6 12.0 - 16.0 g/dL   HCT 40.2 35.0 - 47.0 %   MCV 89.9 80.0 - 100.0 fL   MCH 30.4 26.0 - 34.0 pg   MCHC 33.8 32.0 - 36.0 g/dL   RDW 12.1 11.5 - 14.5 %   Platelets 228 150 - 440 K/uL   Neutrophils Relative % 63 %   Neutro Abs 4.3 1.4 - 6.5 K/uL   Lymphocytes Relative 30 %   Lymphs Abs 2.1 1.0 - 3.6 K/uL   Monocytes Relative 5 %   Monocytes Absolute 0.4 0.2 - 0.9 K/uL   Eosinophils Relative 1 %   Eosinophils Absolute 0.1 0 - 0.7 K/uL   Basophils Relative 1 %   Basophils Absolute 0.0 0 - 0.1 K/uL    Comment: Performed at [facility] [address] [place], [place] [address]    Assessment/Plan:  Hypertension blood pressure control important in reducing the progression of atherosclerotic disease. On appropriate oral medications.   DLBCL (diffuse large B cell lymphoma) ([place]) This is somewhat remote and she has no evidence of disease.  She no longer needs her port.  We discussed removal of the port at her convenience in the upcoming weeks.  I think that would be the most prudent option at this point.  If she ever  needs the port again in the future, it could certainly be replaced without difficulty.  Risks and benefits were discussed      Leotis Pain 08/21/2017, 10:40 AM   This note was created with Dragon medical transcription system.  Any errors from dictation are unintentional.

## 2017-08-21 NOTE — Assessment & Plan Note (Signed)
This is somewhat remote and she has no evidence of disease.  She no longer needs her port.  We discussed removal of the port at her convenience in the upcoming weeks.  I think that would be the most prudent option at this point.  If she ever needs the port again in the future, it could certainly be replaced without difficulty.  Risks and benefits were discussed

## 2017-08-21 NOTE — Assessment & Plan Note (Signed)
blood pressure control important in reducing the progression of atherosclerotic disease. On appropriate oral medications.  

## 2017-08-23 NOTE — Telephone Encounter (Signed)
I attempted to contact the patient to schedule her port removal. I left a message for a return call.

## 2017-08-24 ENCOUNTER — Encounter (INDEPENDENT_AMBULATORY_CARE_PROVIDER_SITE_OTHER): Payer: Self-pay

## 2017-08-27 ENCOUNTER — Other Ambulatory Visit (INDEPENDENT_AMBULATORY_CARE_PROVIDER_SITE_OTHER): Payer: Self-pay | Admitting: Vascular Surgery

## 2017-09-02 MED ORDER — CLINDAMYCIN PHOSPHATE 300 MG/50ML IV SOLN
300.0000 mg | Freq: Once | INTRAVENOUS | Status: AC
  Administered 2017-09-03: 300 mg via INTRAVENOUS

## 2017-09-03 ENCOUNTER — Ambulatory Visit
Admission: RE | Admit: 2017-09-03 | Discharge: 2017-09-03 | Disposition: A | Discharge status: Home / Self Care | Payer: BLUE CROSS/BLUE SHIELD | Source: Ambulatory Visit | Attending: Vascular Surgery | Admitting: Vascular Surgery

## 2017-09-03 ENCOUNTER — Encounter: Payer: Self-pay | Admitting: Vascular Surgery

## 2017-09-03 ENCOUNTER — Encounter
Admission: RE | Disposition: A | Discharge status: Home / Self Care | Payer: Self-pay | Source: Ambulatory Visit | Attending: Vascular Surgery

## 2017-09-03 DIAGNOSIS — Z9484 Stem cells transplant status: Secondary | ICD-10-CM | POA: Insufficient documentation

## 2017-09-03 DIAGNOSIS — Z90710 Acquired absence of both cervix and uterus: Secondary | ICD-10-CM | POA: Diagnosis not present

## 2017-09-03 DIAGNOSIS — Z452 Encounter for adjustment and management of vascular access device: Secondary | ICD-10-CM | POA: Insufficient documentation

## 2017-09-03 DIAGNOSIS — Z8572 Personal history of non-Hodgkin lymphomas: Secondary | ICD-10-CM | POA: Diagnosis not present

## 2017-09-03 DIAGNOSIS — Z87891 Personal history of nicotine dependence: Secondary | ICD-10-CM | POA: Insufficient documentation

## 2017-09-03 DIAGNOSIS — N189 Chronic kidney disease, unspecified: Secondary | ICD-10-CM | POA: Insufficient documentation

## 2017-09-03 DIAGNOSIS — Z79899 Other long term (current) drug therapy: Secondary | ICD-10-CM | POA: Insufficient documentation

## 2017-09-03 DIAGNOSIS — Z87442 Personal history of urinary calculi: Secondary | ICD-10-CM | POA: Insufficient documentation

## 2017-09-03 DIAGNOSIS — Z880 Allergy status to penicillin: Secondary | ICD-10-CM | POA: Diagnosis not present

## 2017-09-03 DIAGNOSIS — Z9221 Personal history of antineoplastic chemotherapy: Secondary | ICD-10-CM | POA: Diagnosis not present

## 2017-09-03 DIAGNOSIS — I129 Hypertensive chronic kidney disease with stage 1 through stage 4 chronic kidney disease, or unspecified chronic kidney disease: Secondary | ICD-10-CM | POA: Insufficient documentation

## 2017-09-03 DIAGNOSIS — Z9889 Other specified postprocedural states: Secondary | ICD-10-CM | POA: Insufficient documentation

## 2017-09-03 DIAGNOSIS — K219 Gastro-esophageal reflux disease without esophagitis: Secondary | ICD-10-CM | POA: Diagnosis not present

## 2017-09-03 DIAGNOSIS — C8590 Non-Hodgkin lymphoma, unspecified, unspecified site: Secondary | ICD-10-CM

## 2017-09-03 HISTORY — PX: PORTA CATH REMOVAL: CATH118286

## 2017-09-03 SURGERY — PORTA CATH REMOVAL
Anesthesia: Moderate Sedation

## 2017-09-03 MED ORDER — FENTANYL CITRATE (PF) 100 MCG/2ML IJ SOLN
INTRAMUSCULAR | Status: DC | PRN
  Administered 2017-09-03: 50 ug via INTRAVENOUS
  Administered 2017-09-03: 12.5 ug via INTRAVENOUS

## 2017-09-03 MED ORDER — HYDROMORPHONE HCL 1 MG/ML IJ SOLN: 1.0000 mg | Freq: Once | INTRAMUSCULAR | Status: DC | PRN

## 2017-09-03 MED ORDER — FENTANYL CITRATE (PF) 100 MCG/2ML IJ SOLN
INTRAMUSCULAR | Status: AC
  Filled 2017-09-03: qty 2

## 2017-09-03 MED ORDER — CLINDAMYCIN PHOSPHATE 300 MG/50ML IV SOLN
INTRAVENOUS | Status: AC
  Filled 2017-09-03: qty 50

## 2017-09-03 MED ORDER — MIDAZOLAM HCL 5 MG/5ML IJ SOLN
INTRAMUSCULAR | Status: AC
  Filled 2017-09-03: qty 5

## 2017-09-03 MED ORDER — MIDAZOLAM HCL 2 MG/2ML IJ SOLN
INTRAMUSCULAR | Status: DC | PRN
  Administered 2017-09-03: 2 mg via INTRAVENOUS
  Administered 2017-09-03: 1 mg via INTRAVENOUS

## 2017-09-03 MED ORDER — SODIUM CHLORIDE 0.9 % IV SOLN
INTRAVENOUS | Status: DC
  Administered 2017-09-03: 09:00:00 via INTRAVENOUS

## 2017-09-03 MED ORDER — LIDOCAINE-EPINEPHRINE (PF) 1 %-1:200000 IJ SOLN
INTRAMUSCULAR | Status: AC
  Filled 2017-09-03: qty 30

## 2017-09-03 MED ORDER — ONDANSETRON HCL 4 MG/2ML IJ SOLN: 4.0000 mg | Freq: Four times a day (QID) | INTRAMUSCULAR | Status: DC | PRN

## 2017-09-03 SURGICAL SUPPLY — 6 items
DERMABOND ADVANCED (GAUZE/BANDAGES/DRESSINGS) ×2
DERMABOND ADVANCED .7 DNX12 (GAUZE/BANDAGES/DRESSINGS) ×1 IMPLANT
PACK ANGIOGRAPHY (CUSTOM PROCEDURE TRAY) ×3 IMPLANT
SUT MNCRL AB 4-0 PS2 18 (SUTURE) ×3 IMPLANT
SUT VIC AB 3-0 SH 27 (SUTURE) ×2
SUT VIC AB 3-0 SH 27X BRD (SUTURE) ×1 IMPLANT

## 2017-09-03 NOTE — Discharge Instructions (Signed)
Implanted Port Removal, Care After °Refer to this sheet in the next few weeks. These instructions provide you with information about caring for yourself after your procedure. Your health care provider may also give you more specific instructions. Your treatment has been planned according to current medical practices, but problems sometimes occur. Call your health care provider if you have any problems or questions after your procedure. °What can I expect after the procedure? °After the procedure, it is common to have: °· Soreness or pain near your incision. °· Some swelling or bruising near your incision. ° °Follow these instructions at home: °Medicines °· Take over-the-counter and prescription medicines only as told by your health care provider. °· If you were prescribed an antibiotic medicine, take it as told by your health care provider. Do not stop taking the antibiotic even if you start to feel better. °Bathing °· Do not take baths, swim, or use a hot tub until your health care provider approves. Ask your health care provider if you can take showers. You may only be allowed to take sponge baths for bathing. °Incision care °· Follow instructions from your health care provider about how to take care of your incision. Make sure you: °? Wash your hands with soap and water before you change your bandage (dressing). If soap and water are not available, use hand sanitizer. °? Change your dressing as told by your health care provider. °? Keep your dressing dry. °? Leave stitches (sutures), skin glue, or adhesive strips in place. These skin closures may need to stay in place for 2 weeks or longer. If adhesive strip edges start to loosen and curl up, you may trim the loose edges. Do not remove adhesive strips completely unless your health care provider tells you to do that. °· Check your incision area every day for signs of infection. Check for: °? More redness, swelling, or pain. °? More fluid or  blood. °? Warmth. °? Pus or a bad smell. °Driving °· If you received a sedative, do not drive for 24 hours after the procedure. °· If you did not receive a sedative, ask your health care provider when it is safe to drive. °Activity °· Return to your normal activities as told by your health care provider. Ask your health care provider what activities are safe for you. °· Until your health care provider says it is safe: °? Do not lift anything that is heavier than 10 lb (4.5 kg). °? Do not do activities that involve lifting your arms over your head. °General instructions °· Do not use any tobacco products, such as cigarettes, chewing tobacco, and e-cigarettes. Tobacco can delay healing. If you need help quitting, ask your health care provider. °· Keep all follow-up visits as told by your health care provider. This is important. °Contact a health care provider if: °· You have more redness, swelling, or pain around your incision. °· You have more fluid or blood coming from your incision. °· Your incision feels warm to the touch. °· You have pus or a bad smell coming from your incision. °· You have a fever. °· You have pain that is not relieved by your pain medicine. °Get help right away if: °· You have chest pain. °· You have difficulty breathing. °This information is not intended to replace advice given to you by your health care provider. Make sure you discuss any questions you have with your health care provider. °Document Released: 06/21/2015 Document Revised: 12/16/2015 Document Reviewed: 04/14/2015 °[employer]vier Interactive Patient   Education © 2018 [employer] ° °

## 2017-09-03 NOTE — H&P (Signed)
[facility] History & Physical Update  The patient was interviewed and re-examined.  The patient's previous History and Physical has been reviewed and is unchanged.  There is no change in the plan of care. We plan to proceed with the scheduled procedure.  Leotis Pain, MD  09/03/2017, 9:28 AM

## 2017-09-03 NOTE — Op Note (Signed)
[facility]       Operative Note  Date: 09/03/2017  Preoperative diagnosis:  1.  History of lymphoma, completed therapy and no longer using port  Postoperative diagnosis:  Same as above  Procedures: #1. Removal of right jugular port a cath   Surgeon: Leotis Pain, MD  Anesthesia: Local with moderate conscious sedation for 15 minutes using 2 mg of Versed and 50 Mcg of Fentanyl  Fluoroscopy time: none  Contrast used: 0  Estimated blood loss: Minimal  Indication for the procedure:  The patient is a 47 y.o. female who has completed her treatment for lymphoma and no longer needs their Port-A-Cath. The patient desires to have this removed. Risks and benefits including need for potential replacement with recurrent disease were discussed and patient is agreeable to proceed.  Description of procedure: The patient was brought to the vascular and interventional radiology suite. Moderate conscious sedation was administered during a face to face encounter with the patient throughout the procedure with my supervision of the RN administering medicines and monitoring the patient's vital signs, pulse oximetry, telemetry and mental status throughout from the start of the procedure until the patient was taken to the recovery room.  The right neck chest and shoulder were sterilely prepped and draped, and a sterile surgical field was created. The area was then anesthetized with 1% lidocaine copiously. The previous incision was reopened and electrocautery used to dissected down to the port and the catheter. These were dissected free and the catheter was gently removed from the vein in its entirety. The port was dissected out from the fibrous connective tissue and the Prolene sutures were removed. The port was then removed in its entirety including the catheter. The wound was then closed with a 3-0 Vicryl and a 4-0 Monocryl and Dermabond was placed as a  dressing. The patient was then taken to the recovery room in stable condition having tolerated the procedure well.  Complications: none  Condition: stable   Leotis Pain, MD 09/03/2017 10:07 AM   This note was created with Dragon Medical transcription system. Any errors in dictation are purely unintentional.

## 2018-02-04 ENCOUNTER — Other Ambulatory Visit: Payer: Self-pay | Admitting: *Deleted

## 2018-02-04 ENCOUNTER — Inpatient Hospital Stay: Payer: BLUE CROSS/BLUE SHIELD | Attending: Internal Medicine

## 2018-02-04 ENCOUNTER — Other Ambulatory Visit: Payer: Self-pay

## 2018-02-04 ENCOUNTER — Inpatient Hospital Stay (HOSPITAL_BASED_OUTPATIENT_CLINIC_OR_DEPARTMENT_OTHER): Payer: BLUE CROSS/BLUE SHIELD | Admitting: Internal Medicine

## 2018-02-04 ENCOUNTER — Encounter: Payer: Self-pay | Admitting: Internal Medicine

## 2018-02-04 VITALS — BP 121/76 | HR 93 | Temp 97.6°F | Resp 20 | Ht 68.0 in | Wt 215.0 lb

## 2018-02-04 DIAGNOSIS — R252 Cramp and spasm: Secondary | ICD-10-CM

## 2018-02-04 DIAGNOSIS — N189 Chronic kidney disease, unspecified: Secondary | ICD-10-CM | POA: Diagnosis not present

## 2018-02-04 DIAGNOSIS — M545 Low back pain: Secondary | ICD-10-CM

## 2018-02-04 DIAGNOSIS — R109 Unspecified abdominal pain: Secondary | ICD-10-CM

## 2018-02-04 DIAGNOSIS — Z87891 Personal history of nicotine dependence: Secondary | ICD-10-CM | POA: Insufficient documentation

## 2018-02-04 DIAGNOSIS — K219 Gastro-esophageal reflux disease without esophagitis: Secondary | ICD-10-CM | POA: Diagnosis not present

## 2018-02-04 DIAGNOSIS — I700 Atherosclerosis of aorta: Secondary | ICD-10-CM | POA: Insufficient documentation

## 2018-02-04 DIAGNOSIS — C8337 Diffuse large B-cell lymphoma, spleen: Secondary | ICD-10-CM

## 2018-02-04 DIAGNOSIS — I129 Hypertensive chronic kidney disease with stage 1 through stage 4 chronic kidney disease, or unspecified chronic kidney disease: Secondary | ICD-10-CM

## 2018-02-04 DIAGNOSIS — Z79899 Other long term (current) drug therapy: Secondary | ICD-10-CM

## 2018-02-04 DIAGNOSIS — Z9221 Personal history of antineoplastic chemotherapy: Secondary | ICD-10-CM

## 2018-02-04 LAB — CBC WITH DIFFERENTIAL/PLATELET
Basophils Absolute: 0 10*3/uL (ref 0–0.1)
Basophils Relative: 0 %
Eosinophils Absolute: 0 10*3/uL (ref 0–0.7)
Eosinophils Relative: 1 %
HCT: 38.5 % (ref 35.0–47.0)
Hemoglobin: 13.3 g/dL (ref 12.0–16.0)
Lymphocytes Relative: 20 %
Lymphs Abs: 1.5 10*3/uL (ref 1.0–3.6)
MCH: 31.5 pg (ref 26.0–34.0)
MCHC: 34.5 g/dL (ref 32.0–36.0)
MCV: 91.2 fL (ref 80.0–100.0)
Monocytes Absolute: 0.3 10*3/uL (ref 0.2–0.9)
Monocytes Relative: 4 %
Neutro Abs: 5.7 10*3/uL (ref 1.4–6.5)
Neutrophils Relative %: 75 %
Platelets: 223 10*3/uL (ref 150–440)
RBC: 4.22 MIL/uL (ref 3.80–5.20)
RDW: 12.6 % (ref 11.5–14.5)
WBC: 7.6 10*3/uL (ref 3.6–11.0)

## 2018-02-04 LAB — LACTATE DEHYDROGENASE: LDH: 165 U/L (ref 98–192)

## 2018-02-04 LAB — COMPREHENSIVE METABOLIC PANEL
ALT: 21 U/L (ref 0–44)
AST: 23 U/L (ref 15–41)
Albumin: 4.1 g/dL (ref 3.5–5.0)
Alkaline Phosphatase: 72 U/L (ref 38–126)
Anion gap: 8 (ref 5–15)
BUN: 12 mg/dL (ref 6–20)
CO2: 26 mmol/L (ref 22–32)
Calcium: 9 mg/dL (ref 8.9–10.3)
Chloride: 104 mmol/L (ref 98–111)
Creatinine, Ser: 0.97 mg/dL (ref 0.44–1.00)
GFR calc Af Amer: >60 mL/min (ref >60)
GFR calc non Af Amer: >60 mL/min (ref >60)
Glucose, Bld: 128 mg/dL — ABNORMAL HIGH (ref 70–99)
Potassium: 3.8 mmol/L (ref 3.5–5.1)
Sodium: 138 mmol/L (ref 135–145)
Total Bilirubin: 1.1 mg/dL (ref 0.3–1.2)
Total Protein: 7.1 g/dL (ref 6.5–8.1)

## 2018-02-04 NOTE — Assessment & Plan Note (Addendum)
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   #Clinically no evidence of recurrence.  # Leg cramps- esp. Night; recommend ca+ vit D  # severe Low back pain-MRI lumbar spine normal- in July 2018. Improved  # flank pain- ? Kidney stones.  Prior CT scan reviewed-3 to 4 mm bilateral kidney stones.  Recommend increased fluid intake.  # follow up in 6 months/labs- cbc/cmp/LDH

## 2018-02-04 NOTE — Progress Notes (Signed)
[facility] OFFICE PROGRESS NOTE  Patient Care Team: Patient, No Pcp Per as PCP - General ([place]) Cammie Sickle, MD as Consulting Physician (Internal Medicine) Bary Castilla Forest Gleason, MD (General Surgery)  Cancer Staging No matching staging information was found for the patient.   Oncology History   Chief Complaint/Diagnosis:   # 2013-  admitted with hypercalcemia and abnormal CT scan of the abdomen  2. 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 Patient was started on RCHOP chemotherapy June of 2013 4. High-dose methotrexate in alternate cycles starting from July 29. 5.last dose of all chemotherapy with R. CHOP (April 29, 2012) patient received total 6 cycles of chemotherapy and tolerated treatment very well without any significant side effect 6.and underwent high dose chemotherapy and stem cell support High-dose chemotherapy with BEAM regimen.  (March 24: 014  # SEP 13th 2017- ~1.2cm R Ingiunal LN; 34mm ex Iliac LN ? Recurrence; RIGHT INGUINAL LN excisional Bx- NEGATIVE [Dr.Byrnett]; JAN 2018- CT NED; sub cm LN     DLBCL (diffuse large B cell lymphoma) ([place])   10/28/2012 Initial Diagnosis    Malignant lymphoma, large cell, diffuse (H[facility])       Diffuse large B-cell lymphoma of spleen ([place])   09/27/2015 Initial Diagnosis    Diffuse large B-cell lymphoma of spleen ([place])        INTERVAL HISTORY:  TIPHANIE VO 47 y.o.  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   Patient continues to complain of bilateral cramping in the legs.  Denies any night sweats or weight loss.  No nausea no vomiting.  Also complained of right flank pain currently resolved.  Intermittent.  REVIEW OF SYSTEMS:  A complete  10 point review of system is done which is negative except mentioned above/history of present illness.   PAST MEDICAL HISTORY :  Past Medical History:  Diagnosis Date  . Anemia   . Anxiety   . Chronic kidney disease    kidney stones  . Chronic sinusitis   . Diffuse large B cell lymphoma ([place]) 2013  . Fatigue   . GERD (gastroesophageal reflux disease)   . Hypertension    past history of hypertension  . Hypoalbuminemia   . Personal history of chemotherapy June 2013 , and  March 2014  . Weight loss     PAST SURGICAL HISTORY :   Past Surgical History:  Procedure Laterality Date  . ABDOMINAL HYSTERECTOMY    . bladder tacking    . INGUINAL LYMPH NODE BIOPSY Right 04/21/2016    INGUINAL LYMPH NODE BIOPSY;  REACTIVE LYMPHADENOPATHY.  Robert Bellow, MD;  [facility];General;  Laterality: Right;  . 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 PORTA CATH REMOVAL N/A 09/03/2017   Procedure: PORTA CATH REMOVAL;  Surgeon: Algernon Huxley, MD;  Location: [facility];  Service: Cardiovascular;  Laterality: N/A;  . PORTACATH PLACEMENT  2013  . RECTAL PROLAPSE REPAIR    . TUBAL LIGATION      FAMILY HISTORY :   Family History  Adopted: Yes  Family history unknown: Yes    SOCIAL HISTORY:   Social History   Tobacco Use  . Smoking status: Former Smoker    Packs/day: 1.00    Types: Cigarettes  Last attempt to quit: 12/16/1998    Years since quitting: 19.1  . Smokeless tobacco: Never Used  Substance Use Topics  . Alcohol use: Yes    Comment: Occassional  . Drug use: No    ALLERGIES:  is allergic to penicillins.  MEDICATIONS:  No current outpatient medications on file.   No current facility-administered medications for this visit.    Facility-Administered Medications Ordered in Other Visits  Medication Dose Route Frequency Provider Last Rate Last Dose  . heparin lock flush 100 unit/mL  500 Units Intracatheter PRN Charlaine Dalton R, MD      . sodium chloride flush (NS)  0.9 % injection 10 mL  10 mL Intravenous PRN Charlaine Dalton R, MD      . sodium chloride flush (NS) 0.9 % injection 10 mL  10 mL Intracatheter PRN Cammie Sickle, MD        PHYSICAL EXAMINATION: ECOG PERFORMANCE STATUS: 0 - Asymptomatic  BP 121/76   Pulse 93   Temp 97.6 F (36.4 C) (Tympanic)   Resp 20   Ht 5\' 8"  (1.727 m)   Wt 215 lb (97.5 kg)   BMI 32.69 kg/m   Filed Weights   02/04/18 1442  Weight: 215 lb (97.5 kg)    GENERAL: Well-nourished well-developed; Alert, no distress and comfortable.  She is alone. EYES: no pallor or icterus OROPHARYNX: no thrush or ulceration; NECK: supple; no lymph nodes felt. LYMPH:  no palpable lymphadenopathy in the axillary or inguinal regions LUNGS: Decreased breath sounds auscultation bilaterally. No wheeze or crackles HEART/CVS: regular rate & rhythm and no murmurs; No lower extremity edema ABDOMEN:abdomen soft, non-tender and normal bowel sounds. No hepatomegaly or splenomegaly.  Musculoskeletal:no cyanosis of digits and no clubbing  PSYCH: alert & oriented x 3 with fluent speech NEURO: no focal motor/sensory deficits SKIN:  no rashes or significant lesions LABORATORY DATA:  I have reviewed the data as listed    Component Value Date/Time   NA 138 02/04/2018 1423   NA 141 08/19/2014 1038   K 3.8 02/04/2018 1423   K 4.3 08/19/2014 1038   CL 104 02/04/2018 1423   CL 103 08/19/2014 1038   CO2 26 02/04/2018 1423   CO2 29 08/19/2014 1038   GLUCOSE 128 (H) 02/04/2018 1423   GLUCOSE 103 (H) 08/19/2014 1038   BUN 12 02/04/2018 1423   BUN 9 08/19/2014 1038   CREATININE 0.97 02/04/2018 1423   CREATININE 1.07 08/19/2014 1038   CALCIUM 9.0 02/04/2018 1423   CALCIUM 8.6 08/19/2014 1038   PROT 7.1 02/04/2018 1423   PROT 7.0 08/19/2014 1038   ALBUMIN 4.1 02/04/2018 1423   ALBUMIN 3.7 08/19/2014 1038   AST 23 02/04/2018 1423   AST 18 08/19/2014 1038   ALT 21 02/04/2018 1423   ALT 36 08/19/2014 1038   ALKPHOS 72 02/04/2018  1423   ALKPHOS 81 08/19/2014 1038   BILITOT 1.1 02/04/2018 1423   BILITOT 0.5 08/19/2014 1038   GFRNONAA >60 02/04/2018 1423   GFRNONAA 59 (L) 08/19/2014 1038   GFRNONAA >60 03/16/2014 0911   GFRAA >60 02/04/2018 1423   GFRAA >60 08/19/2014 1038   GFRAA >60 03/16/2014 0911    No results found for: SPEP, UPEP  Lab Results  Component Value Date   WBC 7.6 02/04/2018   NEUTROABS 5.7 02/04/2018   HGB 13.3 02/04/2018   HCT 38.5 02/04/2018   MCV 91.2 02/04/2018   PLT 223 02/04/2018      Chemistry  Component Value Date/Time   NA 138 02/04/2018 1423   NA 141 08/19/2014 1038   K 3.8 02/04/2018 1423   K 4.3 08/19/2014 1038   CL 104 02/04/2018 1423   CL 103 08/19/2014 1038   CO2 26 02/04/2018 1423   CO2 29 08/19/2014 1038   BUN 12 02/04/2018 1423   BUN 9 08/19/2014 1038   CREATININE 0.97 02/04/2018 1423   CREATININE 1.07 08/19/2014 1038      Component Value Date/Time   CALCIUM 9.0 02/04/2018 1423   CALCIUM 8.6 08/19/2014 1038   ALKPHOS 72 02/04/2018 1423   ALKPHOS 81 08/19/2014 1038   AST 23 02/04/2018 1423   AST 18 08/19/2014 1038   ALT 21 02/04/2018 1423   ALT 36 08/19/2014 1038   BILITOT 1.1 02/04/2018 1423   BILITOT 0.5 08/19/2014 1038     IMPRESSION: 1. Stable exam.  No new or progressive findings. 2. Retrocaval and left iliac lymph nodes are stable since prior study. 3. Cholelithiasis. 4. Nonobstructing tiny bilateral renal stones. 5. Hepatic steatosis. 6.  Aortic Atherosclerois (ICD10-170.0)   Electronically Signed   By: Misty Stanley M.D.   On: 02/02/2017 11:56  RADIOGRAPHIC STUDIES: I have personally reviewed the radiological images as listed and agreed with the findings in the report. No results found.   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   #Clinically no evidence  of recurrence.  # Leg cramps- esp. Night; recommend ca+ vit D  # severe Low back pain-MRI lumbar spine normal- in July 2018. Improved  # flank pain- ? Kidney stones.  Prior CT scan reviewed-3 to 4 mm bilateral kidney stones.  Recommend increased fluid intake.  # follow up in 6 months/labs- cbc/cmp/LDH    Orders Placed This Encounter  Procedures  . CBC with Differential    Standing Status:   Future    Standing Expiration Date:   02/05/2019  . Comprehensive metabolic panel    Standing Status:   Future    Standing Expiration Date:   02/05/2019  . Lactate dehydrogenase    Standing Status:   Future    Standing Expiration Date:   02/05/2019   All questions were answered. The patient knows to call the clinic with any problems, questions or concerns.      Cammie Sickle, MD 02/05/2018 6:43 PM

## 2018-07-12 IMAGING — PT NM PET TUM IMG RESTAG (PS) SKULL BASE T - THIGH
9 of 10 series · 19 of 25 positions shown · non-contrast
Comparison: 04/05/2016 CT chest, abdomen and pelvis. 03/12/2015
PET-CT.

CLINICAL DATA: Subsequent treatment strategy for diffuse large
B-cell lymphoma initially diagnosed in November 2011 status post
chemotherapy. New pelvic lymphadenopathy on recent CT study. No
recent therapy.

EXAM:
NUCLEAR MEDICINE PET SKULL BASE TO THIGH
TECHNIQUE: 12.0 mCi F-18 FDG was injected intravenously. Full-ring PET imaging
was performed from the skull base to thigh after the radiotracer. CT
data was obtained and used for attenuation correction and anatomic
localization.
FASTING BLOOD GLUCOSE:  Value: 96 mg/dl

[Series 3: ct wb 5.0 b30f · axial · 5.0mm · 0.98mm/px · z∈[-1374,-942]mm · 2 of 290 slices shown]
[im 1/290]
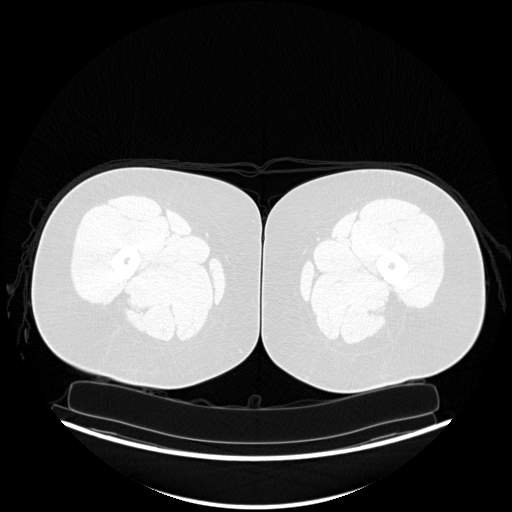
[im 145/290]
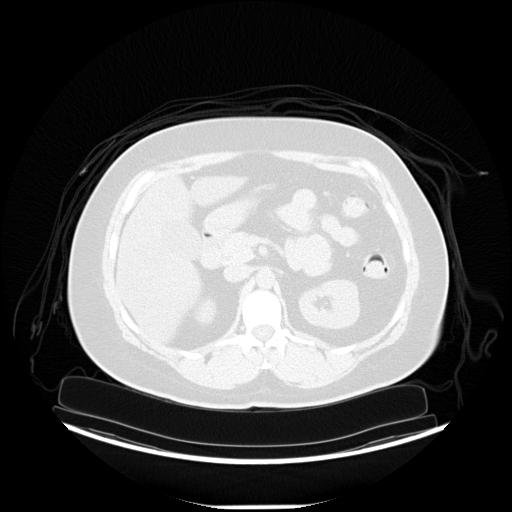

[Series 4: pet wb (ac) · axial · 5.0mm · 4.07mm/px · z∈[-1374,-506]mm · 3 of 290 slices shown]
[im 1/290]
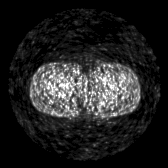
[im 145/290]
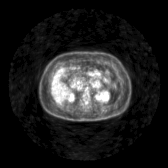
[im 290/290]
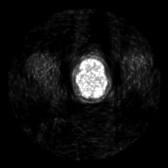

[Series 5: pet wb uncorrected (nac) · axial · 5.0mm · 4.07mm/px · z∈[-1086,-506]mm · 3 of 290 slices shown]
[im 97/290]
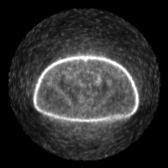
[im 193/290]
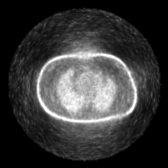
[im 290/290]
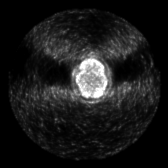

[Series 603: pet axial · 3 of 288 slices shown]
[im 96/288]
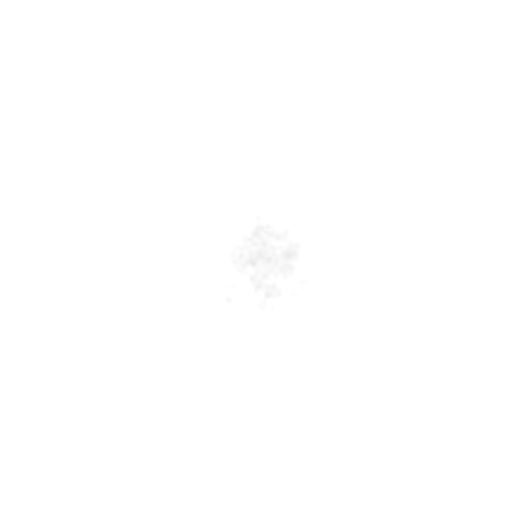
[im 192/288]
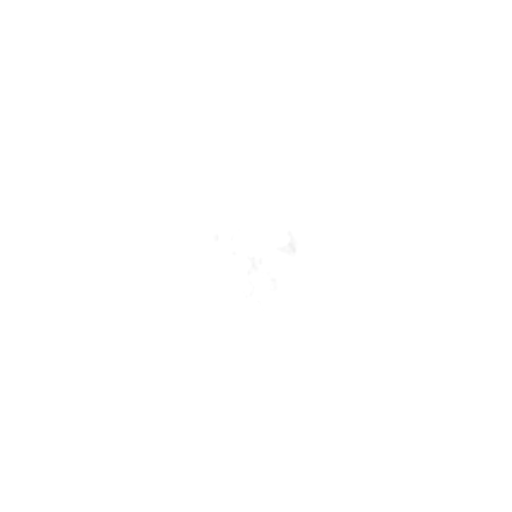
[im 288/288]
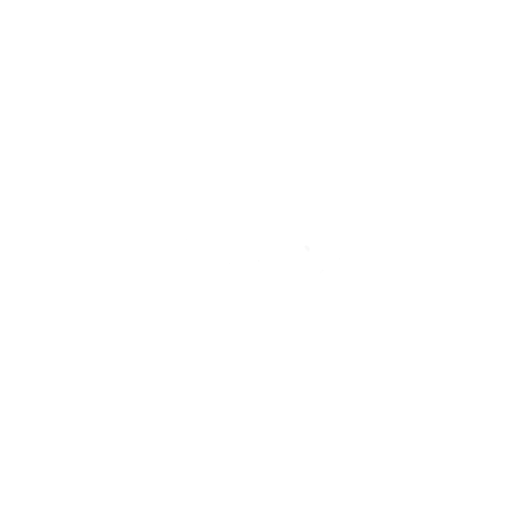

[Series 605: pet sagittal · 2 of 118 slices shown]
[im 1/118]
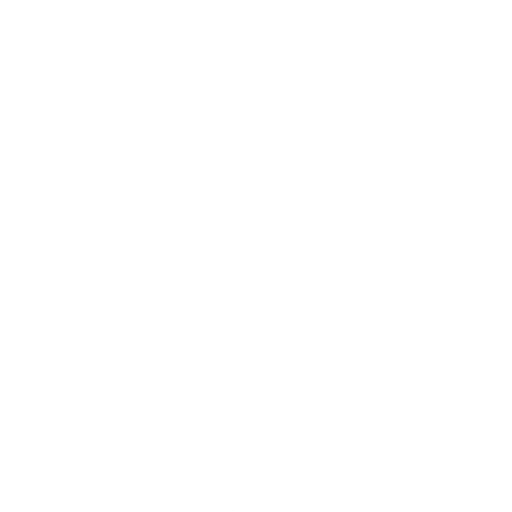
[im 118/118]
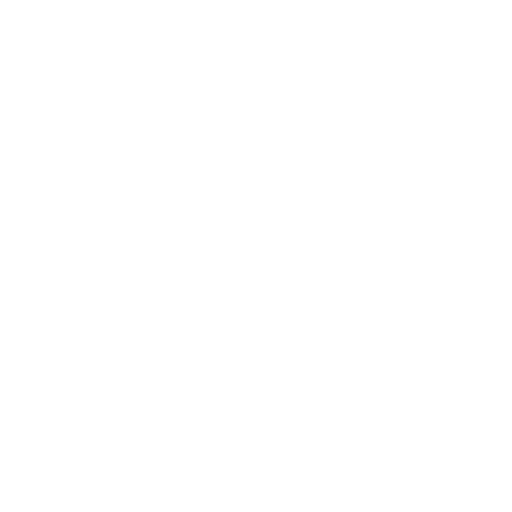

[Series 606: pet ct axial · 3 of 287 slices shown]
[im 1/287]
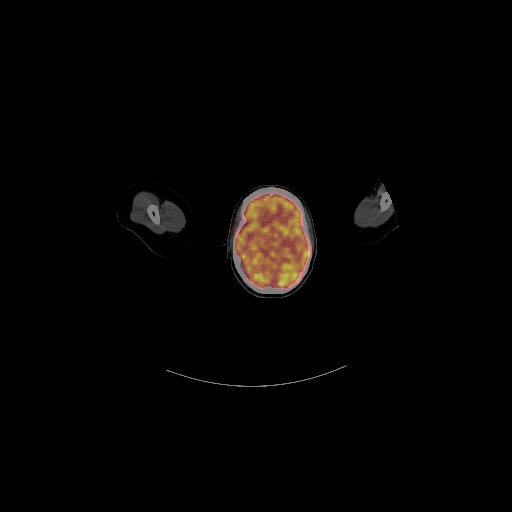
[im 191/287]
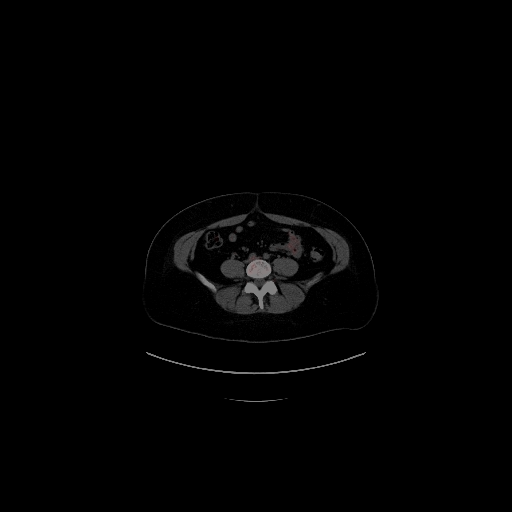
[im 287/287]
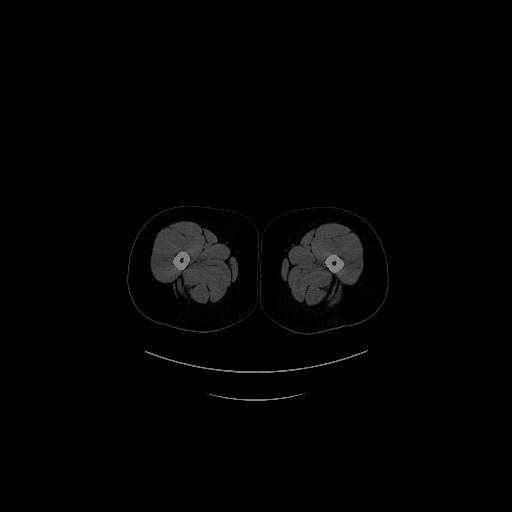

[Series 607: pet ct coronal · 1 of 87 slices shown]
[im 1/87]
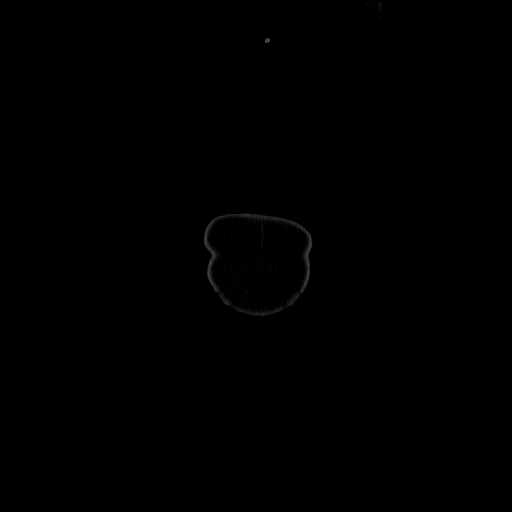

[Series 608: pet ct sagittal · 1 of 131 slices shown]
[im 131/131]
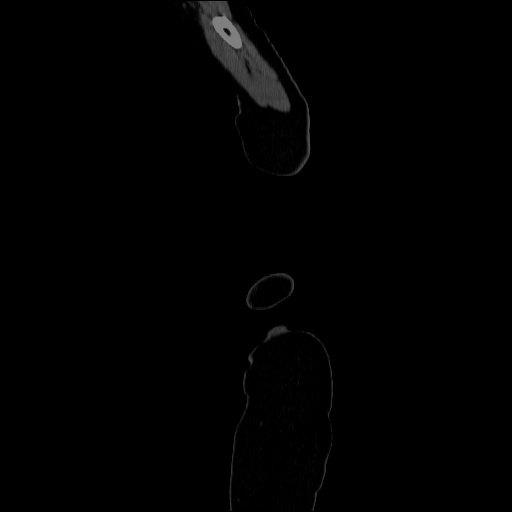

[Series 1262: results mm oncology reading · 0.51mm/px · 1 of 5 slices shown]
[im 1/5]
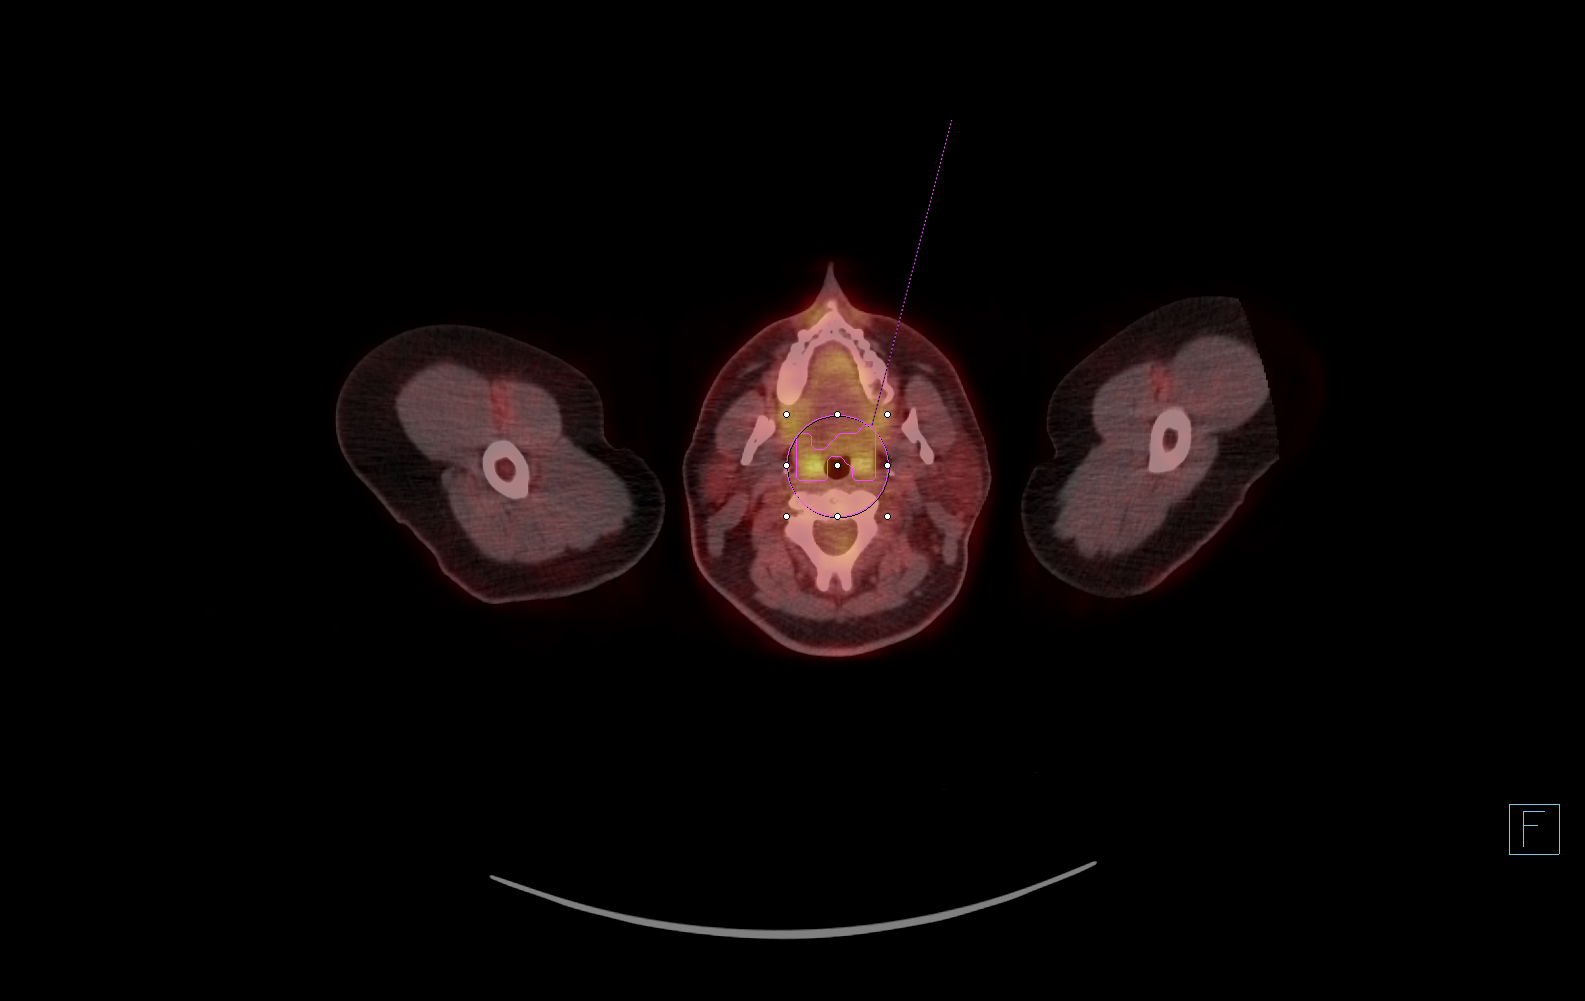

[19 of 25 positions shown; findings below may reference images not displayed]

FINDINGS: NECK

No hypermetabolic lymph nodes in the neck.

Symmetric hypermetabolism (max SUV 6.7) in Waldeyer's ring without
discrete mass on the CT images, similar to the prior PET-CT (prior
max SUV 9.0), probably physiologic.

CHEST

There is new hypermetabolism in the right hilum with max SUV 5.0,
without discrete right hilar node on the noncontrast CT images. No
hypermetabolic or enlarged axillary, mediastinal or left hilar
nodes.

Right internal jugular MediPort terminates in the lower right
atrium. No pleural effusions. No acute consolidative airspace
disease or significant pulmonary nodules.

ABDOMEN/PELVIS

Enlarged hypermetabolic 1.3 cm left external iliac lymph node with
max SUV 10.2 (series 3/image 234), new since 03/12/2015 PET-CT.

Enlarged hypermetabolic 1.4 cm right inguinal node with max SUV
(series 3/image 239), new.

Enlarged hypermetabolic 1.0 cm left common iliac node with max SUV
6.8 (series 3/image 190), new.

No abnormal hypermetabolic activity within the liver, pancreas,
adrenal glands, or spleen. No hypermetabolic lymph nodes in the
abdomen. Tiny hiatal hernia. Diffuse hepatic steatosis.
Cholelithiasis. Nonobstructing 3 mm lower right and 2 mm lower left
renal stones. No hydronephrosis. Moderate sigmoid diverticulosis.
Atherosclerotic nonaneurysmal abdominal aorta.

SKELETON

No focal hypermetabolic activity to suggest skeletal metastasis.
IMPRESSION: 1. Enlarged hypermetabolic bilateral pelvic lymphadenopathy
involving the left common iliac, left external iliac and right
inguinal nodal chains, new since 03/12/2015 PET-CT, consistent with
recurrent lymphoma.
2. New hypermetabolism in the right hilum, nonspecific, potentially
an additional site of lymphoma recurrence.
3. Additional findings include tiny hiatal hernia, diffuse hepatic
steatosis, cholelithiasis, nonobstructing bilateral nephrolithiasis,
moderate sigmoid diverticulosis and aortic atherosclerosis.

## 2018-08-05 ENCOUNTER — Inpatient Hospital Stay (HOSPITAL_BASED_OUTPATIENT_CLINIC_OR_DEPARTMENT_OTHER): Payer: BLUE CROSS/BLUE SHIELD | Admitting: Internal Medicine

## 2018-08-05 ENCOUNTER — Inpatient Hospital Stay: Payer: BLUE CROSS/BLUE SHIELD | Attending: Internal Medicine

## 2018-08-05 ENCOUNTER — Encounter: Payer: Self-pay | Admitting: Internal Medicine

## 2018-08-05 DIAGNOSIS — C8337 Diffuse large B-cell lymphoma, spleen: Secondary | ICD-10-CM | POA: Diagnosis not present

## 2018-08-05 DIAGNOSIS — B001 Herpesviral vesicular dermatitis: Secondary | ICD-10-CM

## 2018-08-05 DIAGNOSIS — Z87891 Personal history of nicotine dependence: Secondary | ICD-10-CM

## 2018-08-05 DIAGNOSIS — M549 Dorsalgia, unspecified: Secondary | ICD-10-CM

## 2018-08-05 DIAGNOSIS — I129 Hypertensive chronic kidney disease with stage 1 through stage 4 chronic kidney disease, or unspecified chronic kidney disease: Secondary | ICD-10-CM | POA: Diagnosis not present

## 2018-08-05 DIAGNOSIS — Z880 Allergy status to penicillin: Secondary | ICD-10-CM | POA: Insufficient documentation

## 2018-08-05 DIAGNOSIS — Z79899 Other long term (current) drug therapy: Secondary | ICD-10-CM | POA: Insufficient documentation

## 2018-08-05 DIAGNOSIS — M545 Low back pain: Secondary | ICD-10-CM | POA: Diagnosis not present

## 2018-08-05 DIAGNOSIS — Z9484 Stem cells transplant status: Secondary | ICD-10-CM

## 2018-08-05 DIAGNOSIS — N189 Chronic kidney disease, unspecified: Secondary | ICD-10-CM | POA: Insufficient documentation

## 2018-08-05 DIAGNOSIS — G8929 Other chronic pain: Secondary | ICD-10-CM | POA: Insufficient documentation

## 2018-08-05 DIAGNOSIS — Z9221 Personal history of antineoplastic chemotherapy: Secondary | ICD-10-CM | POA: Diagnosis not present

## 2018-08-05 LAB — COMPREHENSIVE METABOLIC PANEL
ALT: 30 U/L (ref 0–44)
AST: 28 U/L (ref 15–41)
Albumin: 4.1 g/dL (ref 3.5–5.0)
Alkaline Phosphatase: 69 U/L (ref 38–126)
Anion gap: 6 (ref 5–15)
BUN: 11 mg/dL (ref 6–20)
CO2: 31 mmol/L (ref 22–32)
Calcium: 8.9 mg/dL (ref 8.9–10.3)
Chloride: 108 mmol/L (ref 98–111)
Creatinine, Ser: 0.89 mg/dL (ref 0.44–1.00)
GFR calc Af Amer: >60 mL/min (ref >60)
GFR calc non Af Amer: >60 mL/min (ref >60)
Glucose, Bld: 104 mg/dL — ABNORMAL HIGH (ref 70–99)
Potassium: 4.4 mmol/L (ref 3.5–5.1)
Sodium: 145 mmol/L (ref 135–145)
Total Bilirubin: 0.8 mg/dL (ref 0.3–1.2)
Total Protein: 7 g/dL (ref 6.5–8.1)

## 2018-08-05 LAB — LACTATE DEHYDROGENASE: LDH: 174 U/L (ref 98–192)

## 2018-08-05 LAB — CBC WITH DIFFERENTIAL/PLATELET
Abs Immature Granulocytes: 0.01 10*3/uL (ref 0.00–0.07)
Basophils Absolute: 0 10*3/uL (ref 0.0–0.1)
Basophils Relative: 1 %
Eosinophils Absolute: 0.1 10*3/uL (ref 0.0–0.5)
Eosinophils Relative: 1 %
HCT: 39.2 % (ref 36.0–46.0)
Hemoglobin: 13.2 g/dL (ref 12.0–15.0)
Immature Granulocytes: 0 %
Lymphocytes Relative: 33 %
Lymphs Abs: 1.8 10*3/uL (ref 0.7–4.0)
MCH: 31.3 pg (ref 26.0–34.0)
MCHC: 33.7 g/dL (ref 30.0–36.0)
MCV: 92.9 fL (ref 80.0–100.0)
Monocytes Absolute: 0.3 10*3/uL (ref 0.1–1.0)
Monocytes Relative: 6 %
Neutro Abs: 3.1 10*3/uL (ref 1.7–7.7)
Neutrophils Relative %: 59 %
Platelets: 195 10*3/uL (ref 150–400)
RBC: 4.22 MIL/uL (ref 3.87–5.11)
RDW: 12.2 % (ref 11.5–15.5)
WBC: 5.3 10*3/uL (ref 4.0–10.5)
nRBC: 0 % (ref 0.0–0.2)

## 2018-08-05 NOTE — Progress Notes (Signed)
[place] OFFICE PROGRESS NOTE  Patient Care Team: Patient, No Pcp Per as PCP - General ([place]) Cammie Sickle, MD as Consulting Physician (Internal Medicine) Bary Castilla Forest Gleason, MD (General Surgery)  Cancer Staging No matching staging information was found for the patient.   Oncology History   Chief Complaint/Diagnosis:   # 2013-  admitted with hypercalcemia and abnormal CT scan of the abdomen  2. 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 Patient was started on RCHOP chemotherapy June of 2013 4. High-dose methotrexate in alternate cycles starting from July 29. 5.last dose of all chemotherapy with R. CHOP (April 29, 2012) patient received total 6 cycles of chemotherapy and tolerated treatment very well without any significant side effect 6.and underwent high dose chemotherapy and stem cell support High-dose chemotherapy with BEAM regimen.  (March 24: 014  # SEP 13th 2017- ~1.2cm R Ingiunal LN; 60mm ex Iliac LN ? Recurrence; RIGHT INGUINAL LN excisional Bx- NEGATIVE [Dr.Byrnett]; JAN 2018- CT NED; sub cm LN  # Port explantation [feb 2019] -------------------   DIAGNOSIS:DLBCL  STAGE:   IV      ;GOALS: cure  CURRENT/MOST RECENT THERAPY: surveillaince      DLBCL (diffuse large B cell lymphoma) ([place])   10/28/2012 Initial Diagnosis    Malignant lymphoma, large cell, diffuse (HCC)     Diffuse large B-cell lymphoma of spleen ([place])   09/27/2015 Initial Diagnosis    Diffuse large B-cell lymphoma of spleen ([place])      INTERVAL HISTORY:  Bethany Kelly 48 y.o.  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   Patient noted to have cold sore on the left lower lip; she has been using topical  antivirals.  No rash anywhere else.  She had her prior history of similar cold sores.  Continues to chronic back pain.  There is no weight loss.  She attributes her back pain to her work.   Review of Systems  Constitutional: Negative for chills, diaphoresis, fever, malaise/fatigue and weight loss.  HENT: Negative for nosebleeds and sore throat.   Eyes: Negative for double vision.  Respiratory: Negative for cough, hemoptysis, sputum production, shortness of breath and wheezing.   Cardiovascular: Negative for chest pain, palpitations, orthopnea and leg swelling.  Gastrointestinal: Negative for abdominal pain, blood in stool, constipation, diarrhea, heartburn, melena, nausea and vomiting.  Genitourinary: Negative for dysuria, frequency and urgency.  Musculoskeletal: Positive for back pain and joint pain.  Skin: Positive for rash. Negative for itching.  Neurological: Negative for dizziness, tingling, focal weakness, weakness and headaches.  Endo/Heme/Allergies: Does not bruise/bleed easily.  Psychiatric/Behavioral: Negative for depression. The patient is not nervous/anxious and does not have insomnia.      PAST MEDICAL HISTORY :  Past Medical History:  Diagnosis Date  . Anemia   . Anxiety   . Chronic kidney disease    kidney stones  . Chronic sinusitis   . Diffuse large B cell lymphoma ([place]) 2013  . Fatigue   . GERD (gastroesophageal reflux disease)   . Hypertension    past history of hypertension  . Hypoalbuminemia   . Personal history of chemotherapy June 2013 , and  March 2014  . Weight loss     PAST SURGICAL HISTORY :   Past Surgical  History:  Procedure Laterality Date  . ABDOMINAL HYSTERECTOMY    . bladder tacking    . INGUINAL LYMPH NODE BIOPSY Right 04/21/2016    INGUINAL LYMPH NODE BIOPSY;  REACTIVE LYMPHADENOPATHY.  Robert Bellow, MD;  [facility] ORS;General;  Laterality: Right;  . 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 PORTA CATH REMOVAL N/A 09/03/2017   Procedure:  PORTA CATH REMOVAL;  Surgeon: Algernon Huxley, MD;  Location: [place] [facility];  Service: Cardiovascular;  Laterality: N/A;  . PORTACATH PLACEMENT  2013  . RECTAL PROLAPSE REPAIR    . TUBAL LIGATION      FAMILY HISTORY :   Family History  Adopted: Yes  Family history unknown: Yes    SOCIAL HISTORY:   Social History   Tobacco Use  . Smoking status: Former Smoker    Packs/day: 1.00    Types: Cigarettes    Last attempt to quit: 12/16/1998    Years since quitting: 19.6  . Smokeless tobacco: Never Used  Substance Use Topics  . Alcohol use: Yes    Comment: Occassional  . Drug use: No    ALLERGIES:  is allergic to penicillins.  MEDICATIONS:  No current outpatient medications on file.   No current facility-administered medications for this visit.    Facility-Administered Medications Ordered in Other Visits  Medication Dose Route Frequency Provider Last Rate Last Dose  . heparin lock flush 100 unit/mL  500 Units Intracatheter PRN Charlaine Dalton R, MD      . sodium chloride flush (NS) 0.9 % injection 10 mL  10 mL Intravenous PRN Charlaine Dalton R, MD      . sodium chloride flush (NS) 0.9 % injection 10 mL  10 mL Intracatheter PRN Cammie Sickle, MD        PHYSICAL EXAMINATION: ECOG PERFORMANCE STATUS: 0 - Asymptomatic  BP 125/83 (BP Location: Left Arm, Patient Position: Sitting, Cuff Size: Normal)   Pulse 80   Temp 98.9 F (37.2 C) (Tympanic)   Resp 16   Wt 211 lb (95.7 kg)   BMI 32.08 kg/m   Filed Weights   08/05/18 1415  Weight: 211 lb (95.7 kg)    Physical Exam  Constitutional: She is oriented to person, place, and time and well-developed, well-nourished, and in no distress.  Accompanied by daughter.  HENT:  Head: Normocephalic and atraumatic.  Mouth/Throat: Oropharynx is clear and moist. No oropharyngeal exudate.  Eyes: Pupils are equal, round, and reactive to light.  Neck: Normal range of motion. Neck supple.  Cardiovascular: Normal rate  and regular rhythm.  Pulmonary/Chest: No respiratory distress. She has no wheezes.  Abdominal: Soft. Bowel sounds are normal. She exhibits no distension and no mass. There is no abdominal tenderness. There is no rebound and no guarding.  Musculoskeletal: Normal range of motion.        General: No tenderness or edema.  Neurological: She is alert and oriented to person, place, and time.  Skin: Skin is warm.  A cold sore noted on the left lower lip  Psychiatric: Affect normal.    I have reviewed the data as listed    Component Value Date/Time   NA 145 08/05/2018 1339   NA 141 08/19/2014 1038   K 4.4 08/05/2018 1339   K 4.3 08/19/2014 1038   CL 108 08/05/2018 1339   CL 103 08/19/2014 1038   CO2 31 08/05/2018 1339   CO2 29 08/19/2014 1038   GLUCOSE 104 (H) 08/05/2018 1339   GLUCOSE 103 (  H) 08/19/2014 1038   BUN 11 08/05/2018 1339   BUN 9 08/19/2014 1038   CREATININE 0.89 08/05/2018 1339   CREATININE 1.07 08/19/2014 1038   CALCIUM 8.9 08/05/2018 1339   CALCIUM 8.6 08/19/2014 1038   PROT 7.0 08/05/2018 1339   PROT 7.0 08/19/2014 1038   ALBUMIN 4.1 08/05/2018 1339   ALBUMIN 3.7 08/19/2014 1038   AST 28 08/05/2018 1339   AST 18 08/19/2014 1038   ALT 30 08/05/2018 1339   ALT 36 08/19/2014 1038   ALKPHOS 69 08/05/2018 1339   ALKPHOS 81 08/19/2014 1038   BILITOT 0.8 08/05/2018 1339   BILITOT 0.5 08/19/2014 1038   GFRNONAA >60 08/05/2018 1339   GFRNONAA 59 (L) 08/19/2014 1038   GFRNONAA >60 03/16/2014 0911   GFRAA >60 08/05/2018 1339   GFRAA >60 08/19/2014 1038   GFRAA >60 03/16/2014 0911    No results found for: SPEP, UPEP  Lab Results  Component Value Date   WBC 5.3 08/05/2018   NEUTROABS 3.1 08/05/2018   HGB 13.2 08/05/2018   HCT 39.2 08/05/2018   MCV 92.9 08/05/2018   PLT 195 08/05/2018      Chemistry      Component Value Date/Time   NA 145 08/05/2018 1339   NA 141 08/19/2014 1038   K 4.4 08/05/2018 1339   K 4.3 08/19/2014 1038   CL 108 08/05/2018 1339    CL 103 08/19/2014 1038   CO2 31 08/05/2018 1339   CO2 29 08/19/2014 1038   BUN 11 08/05/2018 1339   BUN 9 08/19/2014 1038   CREATININE 0.89 08/05/2018 1339   CREATININE 1.07 08/19/2014 1038      Component Value Date/Time   CALCIUM 8.9 08/05/2018 1339   CALCIUM 8.6 08/19/2014 1038   ALKPHOS 69 08/05/2018 1339   ALKPHOS 81 08/19/2014 1038   AST 28 08/05/2018 1339   AST 18 08/19/2014 1038   ALT 30 08/05/2018 1339   ALT 36 08/19/2014 1038   BILITOT 0.8 08/05/2018 1339   BILITOT 0.5 08/19/2014 1038       On: 02/02/2017 11:56  RADIOGRAPHIC STUDIES: I have personally reviewed the radiological images as listed and agreed with the findings in the report. No results found.   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 stable.  No clinical evidence of recurrence.  #History Low back pain-MRI lumbar spine normal- in July 2018.  Stable.  # left lip cold sore- continue topical; if not improved; can call for valtrex.   # DISPOSITION: #  follow up in 6 months-MD/labs- cbc/cmp/LDH-Dr.B   No orders of the defined types were placed in this encounter.  All questions were answered. The patient knows to call the clinic with any problems, questions or concerns.      Cammie Sickle, MD 08/05/2018 2:39 PM

## 2018-08-05 NOTE — Assessment & Plan Note (Addendum)
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 stable.  No clinical evidence of recurrence.  #History Low back pain-MRI lumbar spine normal- in July 2018.  Stable.  # left lip cold sore- continue topical; if not improved; can call for valtrex.   # DISPOSITION: #  follow up in 6 months-MD/labs- cbc/cmp/LDH-Dr.B

## 2018-08-06 ENCOUNTER — Other Ambulatory Visit: Payer: Self-pay

## 2018-08-06 DIAGNOSIS — C8337 Diffuse large B-cell lymphoma, spleen: Secondary | ICD-10-CM

## 2018-10-08 ENCOUNTER — Telehealth: Payer: Self-pay | Admitting: *Deleted

## 2018-10-08 NOTE — Telephone Encounter (Signed)
Patient called asking for Dr B to refill her Albuterol inhaler she has not had filled in a very long time in preparation for Covid, She states Dr B is acting as her PCP as she still does not have a PCP. Please advise

## 2018-10-08 NOTE — Telephone Encounter (Signed)
Bethany Kelly- ok to refill pt's albuterol. 2 more refills.  GB

## 2018-10-09 MED ORDER — ALBUTEROL SULFATE HFA 108 (90 BASE) MCG/ACT IN AERS: 2.0000 | INHALATION_SPRAY | Freq: Four times a day (QID) | RESPIRATORY_TRACT | 2 refills | Status: AC | PRN

## 2019-01-24 ENCOUNTER — Other Ambulatory Visit: Payer: Self-pay

## 2019-01-24 ENCOUNTER — Encounter: Payer: Self-pay | Admitting: Emergency Medicine

## 2019-01-24 ENCOUNTER — Emergency Department
Admission: EM | Admit: 2019-01-24 | Discharge: 2019-01-24 | Disposition: A | Discharge status: Home / Self Care | Payer: No Typology Code available for payment source | Attending: Emergency Medicine | Admitting: Emergency Medicine

## 2019-01-24 DIAGNOSIS — S61422A Laceration with foreign body of left hand, initial encounter: Secondary | ICD-10-CM | POA: Diagnosis present

## 2019-01-24 DIAGNOSIS — Y9389 Activity, other specified: Secondary | ICD-10-CM | POA: Insufficient documentation

## 2019-01-24 DIAGNOSIS — S61215A Laceration without foreign body of left ring finger without damage to nail, initial encounter: Secondary | ICD-10-CM | POA: Diagnosis not present

## 2019-01-24 DIAGNOSIS — Z23 Encounter for immunization: Secondary | ICD-10-CM | POA: Diagnosis not present

## 2019-01-24 DIAGNOSIS — Z79899 Other long term (current) drug therapy: Secondary | ICD-10-CM | POA: Insufficient documentation

## 2019-01-24 DIAGNOSIS — W268XXA Contact with other sharp object(s), not elsewhere classified, initial encounter: Secondary | ICD-10-CM | POA: Diagnosis not present

## 2019-01-24 DIAGNOSIS — I10 Essential (primary) hypertension: Secondary | ICD-10-CM | POA: Insufficient documentation

## 2019-01-24 DIAGNOSIS — Y929 Unspecified place or not applicable: Secondary | ICD-10-CM | POA: Insufficient documentation

## 2019-01-24 DIAGNOSIS — Z87891 Personal history of nicotine dependence: Secondary | ICD-10-CM | POA: Diagnosis not present

## 2019-01-24 DIAGNOSIS — Y990 Civilian activity done for income or pay: Secondary | ICD-10-CM | POA: Diagnosis not present

## 2019-01-24 DIAGNOSIS — S61412A Laceration without foreign body of left hand, initial encounter: Secondary | ICD-10-CM

## 2019-01-24 MED ORDER — TETANUS-DIPHTH-ACELL PERTUSSIS 5-2.5-18.5 LF-MCG/0.5 IM SUSP
0.5000 mL | Freq: Once | INTRAMUSCULAR | Status: AC
  Administered 2019-01-24: 0.5 mL via INTRAMUSCULAR
  Filled 2019-01-24: qty 0.5

## 2019-01-24 MED ORDER — LIDOCAINE HCL (PF) 1 % IJ SOLN
5.0000 mL | Freq: Once | INTRAMUSCULAR | Status: AC
  Administered 2019-01-24: 5 mL
  Filled 2019-01-24: qty 5

## 2019-01-24 NOTE — ED Notes (Signed)
Completed Workman's Comp form and urinalysis.

## 2019-01-24 NOTE — Discharge Instructions (Signed)
Keep area clean and dry.  Clean daily with mild soap and water and allowed to dry completely before covering it with a bandage.  Watch for any signs of infection and return to the ED over the holiday weekend if it becomes red, increased swelling, pus, or feeling feverish.  You may return to the emergency department or any urgent care for suture removal in approximately 10 to 12 days.  You may take Tylenol as needed for pain.

## 2019-01-24 NOTE — ED Notes (Signed)
Pt was cutting a box with new box cutter and caused what appears to be a superficial laceration to the left hand and left ring finger - bleeding controlled

## 2019-01-24 NOTE — ED Provider Notes (Signed)
[facility] Emergency Department Provider Note   ____________________________________________   First MD Initiated Contact with Patient 01/24/19 0809     (approximate)  I have reviewed the triage vital signs and the nursing notes.   HISTORY  Chief Complaint Laceration   HPI Bethany EHLER is a 48 y.o. female presents to the ED with a Workmen's Comp. injury involving her left hand.  Patient states that she was using a box cutter and has laceration to the base of her left thumb and a laceration to her left fourth digit.  Bleeding currently is controlled.  Patient states that her last tetanus was possibly 6 years ago.  She rates her pain as a 0/10.      Past Medical History:  Diagnosis Date  . Anemia   . Anxiety   . Chronic kidney disease    kidney stones  . Chronic sinusitis   . Diffuse large B cell lymphoma ([place]) 2013  . Fatigue   . GERD (gastroesophageal reflux disease)   . Hypertension    past history of hypertension  . Hypoalbuminemia   . Personal history of chemotherapy June 2013 , and  March 2014  . Weight loss     Patient Active Problem List   Diagnosis Date Noted  . Closed fracture of lateral malleolus 02/14/2017  . Back pain, subacute 02/06/2017  . Diffuse large B-cell lymphoma of spleen ([place]) 09/27/2015  . Chronic sinusitis   . Patellofemoral stress syndrome 04/13/2014  . Lateral epicondylitis of elbow 04/02/2014  . GERD (gastroesophageal reflux disease) 10/29/2012  . Hypertension 10/28/2012  . DLBCL (diffuse large B cell lymphoma) ([place]) 10/28/2012  . 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 History of bone marrow transplant ([place]) 10/09/2012    Past Surgical History:  Procedure Laterality Date  . ABDOMINAL HYSTERECTOMY    . bladder tacking    . INGUINAL LYMPH NODE BIOPSY Right 04/21/2016    INGUINAL LYMPH NODE BIOPSY;  REACTIVE LYMPHADENOPATHY.  Robert Bellow, MD;  [facility] ORS;General;  Laterality: Right;  .  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 PORTA CATH REMOVAL N/A 09/03/2017   Procedure: PORTA CATH REMOVAL;  Surgeon: Algernon Huxley, MD;  Location: [place] [facility];  Service: Cardiovascular;  Laterality: N/A;  . PORTACATH PLACEMENT  2013  . RECTAL PROLAPSE REPAIR    . TUBAL LIGATION      Prior to Admission medications   Medication Sig Start Date End Date Taking? Authorizing Provider  albuterol (PROVENTIL HFA;VENTOLIN HFA) 108 (90 Base) MCG/ACT inhaler Inhale 2 puffs into the lungs every 6 (six) hours as needed for wheezing or shortness of breath. 10/09/18   Cammie Sickle, MD    Allergies Penicillins  Family History  Adopted: Yes  Family history unknown: Yes    Social History Social History   Tobacco Use  . Smoking status: Former Smoker    Packs/day: 1.00    Types: Cigarettes    Quit date: 12/16/1998    Years since quitting: 20.1  . Smokeless tobacco: Never Used  Substance Use Topics  . Alcohol use: Yes    Comment: Occassional  . Drug use: No    Review of Systems Constitutional: No fever/chills Cardiovascular: Denies chest pain. Respiratory: Denies shortness of breath. Musculoskeletal: Negative for skeletal pain. Skin: Positive for lacerations x2. Neurological: Negative for  focal weakness or numbness. ____________________________________________   PHYSICAL EXAM:  VITAL SIGNS: ED Triage Vitals  Enc Vitals Group  BP 01/24/19 0803 (!) 143/84     Pulse Rate 01/24/19 0803 79     Resp 01/24/19 0803 16     Temp 01/24/19 0803 98 F (36.7 C)     Temp Source 01/24/19 0803 Oral     SpO2 01/24/19 0803 98 %     Weight 01/24/19 0759 210 lb (95.3 kg)     Height 01/24/19 0759 5\' 8"  (1.727 m)     Head Circumference --      Peak Flow --      Pain Score 01/24/19 0759 0     Pain Loc --      Pain Edu? --      Excl. in [place]? --     Constitutional: Alert and oriented. Well appearing and in no acute distress. Eyes: Conjunctivae are normal.  Head: Atraumatic.  Neck: No stridor.   Cardiovascular: Normal rate, regular rhythm. Grossly normal heart sounds.  Good peripheral circulation. Respiratory: Normal respiratory effort.  No retractions. Lungs CTAB. Musculoskeletal: Moves left hand without any difficulty and is able to flex and extend all digits without any difficulty or restriction.  There is a laceration noted in the hyperthenar area of the left thumb without active bleeding.  There is also a flap type laceration noted to the fourth digit of the left hand without active bleeding.  No foreign bodies were noted.  Motor sensory function intact.  Capillary refills less than 3 seconds. Neurologic:  Normal speech and language. No gross focal neurologic deficits are appreciated. No gait instability. Skin:  Skin is warm, dry.  Laceration as noted above. Psychiatric: Mood and affect are normal. Speech and behavior are normal.  ____________________________________________   LABS (all labs ordered are listed, but only abnormal results are displayed)  Labs Reviewed - No data to display   PROCEDURES  Procedure(s) performed (including Critical Care):  Marland KitchenMarland KitchenLaceration Repair  Date/Time: 01/24/2019 9:13 AM Performed by: Johnn Hai, PA-C Authorized by: Johnn Hai, PA-C   Consent:    Consent obtained:  Verbal   Consent given by:  Patient   Risks discussed:  Infection and poor wound healing Anesthesia (see MAR for exact dosages):    Anesthesia method:  Local infiltration   Local anesthetic:  Lidocaine 1% w/o epi Laceration details:    Location:  Hand   Hand location:  L palm   Length (cm):  2.5 Repair type:    Repair type:  Simple Pre-procedure details:    Preparation:  Patient was prepped and draped in usual sterile fashion Exploration:    Hemostasis achieved with:  Direct pressure   Contaminated: no   Treatment:    Area cleansed with:  Betadine and saline   Amount of cleaning:  Standard   Irrigation solution:  Sterile saline    Irrigation method:  Tap   Visualized foreign bodies/material removed: no   Skin repair:    Repair method:  Sutures   Suture size:  5-0   Suture material:  Nylon   Number of sutures:  3 Approximation:    Approximation:  Close Post-procedure details:    Dressing:  Non-adherent dressing .Marland KitchenLaceration Repair  Date/Time: 01/24/2019 9:15 AM Performed by: Johnn Hai, PA-C Authorized by: Johnn Hai, PA-C   Consent:    Consent obtained:  Verbal   Consent given by:  Patient   Risks discussed:  Infection and poor wound healing Anesthesia (see MAR for exact dosages):    Anesthesia method:  Local infiltration   Local anesthetic:  Lidocaine 1% w/o epi Laceration details:    Location:  Finger   Finger location:  L ring finger   Length (cm):  1.5 Repair type:    Repair type:  Simple Pre-procedure details:    Preparation:  Patient was prepped and draped in usual sterile fashion Exploration:    Hemostasis achieved with:  Direct pressure   Contaminated: no   Treatment:    Area cleansed with:  Betadine and saline   Amount of cleaning:  Standard   Irrigation solution:  Sterile saline   Irrigation method:  Tap   Visualized foreign bodies/material removed: no   Skin repair:    Repair method:  Sutures   Suture size:  5-0   Suture material:  Nylon   Suture technique:  Simple interrupted   Number of sutures:  3 Approximation:    Approximation:  Close Post-procedure details:    Dressing:  Non-adherent dressing     ____________________________________________   INITIAL IMPRESSION / ASSESSMENT AND PLAN / ED COURSE  As part of my medical decision making, I reviewed the following data within the electronic MEDICAL RECORD NUMBER Notes from prior ED visits and [place] Controlled Substance Database  48 year old female presents to the ED with a Workmen's Comp. injury.  Patient states that she was using a box cutter when she cut her left hand and left fourth finger.  Range of motion is  still within normal limits.  No foreign bodies were noted.  Tetanus was updated.  Both areas were sutured with 5-0 nylon.  Patient did not have any difficulties with this.  She was given a note for work with limited use of her left hand and also instructions to keep the area clean and dry.  She was given instructions to have the sutures removed in 10 to 12 days.  She is return to the emergency department if any signs of infection.  ____________________________________________   FINAL CLINICAL IMPRESSION(S) / ED DIAGNOSES  Final diagnoses:  Laceration of left hand without foreign body, initial encounter  Laceration of left ring finger without foreign body without damage to nail, initial encounter     ED Discharge Orders    None       Note:  This document was prepared using Dragon voice recognition software and may include unintentional dictation errors.    Johnn Hai, PA-C 01/24/19 1228    Nance Pear, MD 01/24/19 [phone]

## 2019-01-24 NOTE — ED Triage Notes (Signed)
Pt to ED via POV stating that she cut her left hand and left ring finger at work. Bleeding is controlled at this time. Pt is in NAD.

## 2019-01-31 ENCOUNTER — Other Ambulatory Visit: Payer: Self-pay

## 2019-02-03 ENCOUNTER — Other Ambulatory Visit: Payer: Self-pay

## 2019-02-03 ENCOUNTER — Emergency Department
Admission: EM | Admit: 2019-02-03 | Discharge: 2019-02-03 | Disposition: A | Discharge status: Home / Self Care | Payer: No Typology Code available for payment source | Attending: Emergency Medicine | Admitting: Emergency Medicine

## 2019-02-03 ENCOUNTER — Encounter: Payer: Self-pay | Admitting: Emergency Medicine

## 2019-02-03 ENCOUNTER — Inpatient Hospital Stay (HOSPITAL_BASED_OUTPATIENT_CLINIC_OR_DEPARTMENT_OTHER): Payer: BLUE CROSS/BLUE SHIELD | Admitting: Internal Medicine

## 2019-02-03 ENCOUNTER — Inpatient Hospital Stay: Payer: BLUE CROSS/BLUE SHIELD | Attending: Internal Medicine

## 2019-02-03 DIAGNOSIS — C8338 Diffuse large B-cell lymphoma, lymph nodes of multiple sites: Secondary | ICD-10-CM | POA: Insufficient documentation

## 2019-02-03 DIAGNOSIS — Z9484 Stem cells transplant status: Secondary | ICD-10-CM | POA: Diagnosis not present

## 2019-02-03 DIAGNOSIS — N189 Chronic kidney disease, unspecified: Secondary | ICD-10-CM | POA: Insufficient documentation

## 2019-02-03 DIAGNOSIS — I10 Essential (primary) hypertension: Secondary | ICD-10-CM | POA: Insufficient documentation

## 2019-02-03 DIAGNOSIS — Z4802 Encounter for removal of sutures: Secondary | ICD-10-CM

## 2019-02-03 DIAGNOSIS — Z79899 Other long term (current) drug therapy: Secondary | ICD-10-CM | POA: Insufficient documentation

## 2019-02-03 DIAGNOSIS — F419 Anxiety disorder, unspecified: Secondary | ICD-10-CM | POA: Insufficient documentation

## 2019-02-03 DIAGNOSIS — Z9221 Personal history of antineoplastic chemotherapy: Secondary | ICD-10-CM | POA: Insufficient documentation

## 2019-02-03 DIAGNOSIS — C8337 Diffuse large B-cell lymphoma, spleen: Secondary | ICD-10-CM

## 2019-02-03 DIAGNOSIS — Z87891 Personal history of nicotine dependence: Secondary | ICD-10-CM | POA: Insufficient documentation

## 2019-02-03 LAB — COMPREHENSIVE METABOLIC PANEL
ALT: 25 U/L (ref 0–44)
AST: 24 U/L (ref 15–41)
Albumin: 4 g/dL (ref 3.5–5.0)
Alkaline Phosphatase: 61 U/L (ref 38–126)
Anion gap: 7 (ref 5–15)
BUN: 11 mg/dL (ref 6–20)
CO2: 27 mmol/L (ref 22–32)
Calcium: 8.4 mg/dL — ABNORMAL LOW (ref 8.9–10.3)
Chloride: 106 mmol/L (ref 98–111)
Creatinine, Ser: 0.92 mg/dL (ref 0.44–1.00)
GFR calc Af Amer: >60 mL/min (ref >60)
GFR calc non Af Amer: >60 mL/min (ref >60)
Glucose, Bld: 120 mg/dL — ABNORMAL HIGH (ref 70–99)
Potassium: 4 mmol/L (ref 3.5–5.1)
Sodium: 140 mmol/L (ref 135–145)
Total Bilirubin: 0.7 mg/dL (ref 0.3–1.2)
Total Protein: 7.5 g/dL (ref 6.5–8.1)

## 2019-02-03 LAB — CBC WITH DIFFERENTIAL/PLATELET
Abs Immature Granulocytes: 0.02 10*3/uL (ref 0.00–0.07)
Basophils Absolute: 0.1 10*3/uL (ref 0.0–0.1)
Basophils Relative: 1 %
Eosinophils Absolute: 0.1 10*3/uL (ref 0.0–0.5)
Eosinophils Relative: 1 %
HCT: 37.6 % (ref 36.0–46.0)
Hemoglobin: 12.6 g/dL (ref 12.0–15.0)
Immature Granulocytes: 0 %
Lymphocytes Relative: 37 %
Lymphs Abs: 2.5 10*3/uL (ref 0.7–4.0)
MCH: 31 pg (ref 26.0–34.0)
MCHC: 33.5 g/dL (ref 30.0–36.0)
MCV: 92.6 fL (ref 80.0–100.0)
Monocytes Absolute: 0.3 10*3/uL (ref 0.1–1.0)
Monocytes Relative: 5 %
Neutro Abs: 3.8 10*3/uL (ref 1.7–7.7)
Neutrophils Relative %: 56 %
Platelets: 218 10*3/uL (ref 150–400)
RBC: 4.06 MIL/uL (ref 3.87–5.11)
RDW: 11.9 % (ref 11.5–15.5)
WBC: 6.8 10*3/uL (ref 4.0–10.5)
nRBC: 0 % (ref 0.0–0.2)

## 2019-02-03 LAB — LACTATE DEHYDROGENASE: LDH: 176 U/L (ref 98–192)

## 2019-02-03 NOTE — Progress Notes (Signed)
Patient does not offer any problems today.  

## 2019-02-03 NOTE — Assessment & Plan Note (Addendum)
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 Stable; clinically no evidence of recurrence.  Continue surveillance without imaging.  #History Low back pain-MRI lumbar spine normal- in July 2018.  Stable.  # Fasting Blood sugars-120; discussed risk of diabetes/prevention; diet changes/weight loss.  I would not recommend any pharmacologic intervention at the time.  Discussed importance of confirmation with repeat blood work  # Low calcium-calcium 8.4.  Recommend calcium plus vitamin D.  # DISPOSITION: #  follow up in 6 months-MD/labs- cbc/cmp/LDH-Dr.B

## 2019-02-03 NOTE — Progress Notes (Signed)
[facility] OFFICE PROGRESS NOTE  Patient Care Team: Patient, No Pcp Per as PCP - General ([place]) Cammie Sickle, MD as Consulting Physician (Internal Medicine) Bary Castilla Forest Gleason, MD (General Surgery)  Cancer Staging No matching staging information was found for the patient.   Oncology History Overview Note  Chief Complaint/Diagnosis:   # 2013-  admitted with hypercalcemia and abnormal CT scan of the abdomen  2. 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 Patient was started on RCHOP chemotherapy June of 2013 4. High-dose methotrexate in alternate cycles starting from July 29. 5.last dose of all chemotherapy with R. CHOP (April 29, 2012) patient received total 6 cycles of chemotherapy and tolerated treatment very well without any significant side effect 6.and underwent high dose chemotherapy and stem cell support High-dose chemotherapy with BEAM regimen.  (March 24: 014  # SEP 13th 2017- ~1.2cm R Ingiunal LN; 28mm ex Iliac LN ? Recurrence; RIGHT INGUINAL LN excisional Bx- NEGATIVE [Dr.Byrnett]; JAN 2018- CT NED; sub cm LN  # Port explantation [feb 2019] -------------------   DIAGNOSIS:DLBCL  STAGE:   IV      ;GOALS: cure  CURRENT/MOST RECENT THERAPY: surveillaince    DLBCL (diffuse large B cell lymphoma) ([place])  10/28/2012 Initial Diagnosis   Malignant lymphoma, large cell, diffuse (HCC)   Diffuse large B-cell lymphoma of spleen ([place])  09/27/2015 Initial Diagnosis   Diffuse large B-cell lymphoma of spleen (H[facility])      INTERVAL HISTORY:  Bethany Kelly 48 y.o.  female pleasant patient above history of Aggressive diffuse large B-cell lymphoma status post autologous stem cell transplant in 2014 is here for follow-up.   Patient denies any unusual lumps or bumps.  Denies weight loss.  Denies any nausea  vomiting.  Appetite decreased.   Continues to have chronic back pain.  Any worse.  No tingling or numbness.  No headaches.  Review of Systems  Constitutional: Negative for chills, diaphoresis, fever, malaise/fatigue and weight loss.  HENT: Negative for nosebleeds and sore throat.   Eyes: Negative for double vision.  Respiratory: Negative for cough, hemoptysis, sputum production, shortness of breath and wheezing.   Cardiovascular: Negative for chest pain, palpitations, orthopnea and leg swelling.  Gastrointestinal: Negative for abdominal pain, blood in stool, constipation, diarrhea, heartburn, melena, nausea and vomiting.  Genitourinary: Negative for dysuria, frequency and urgency.  Musculoskeletal: Positive for back pain and joint pain.  Skin: Negative for itching.  Neurological: Negative for dizziness, tingling, focal weakness, weakness and headaches.  Endo/Heme/Allergies: Does not bruise/bleed easily.  Psychiatric/Behavioral: Negative for depression. The patient is not nervous/anxious and does not have insomnia.      PAST MEDICAL HISTORY :  Past Medical History:  Diagnosis Date  . Anemia   . Anxiety   . Chronic kidney disease    kidney stones  . Chronic sinusitis   . Diffuse large B cell lymphoma ([place]) 2013  . Fatigue   . GERD (gastroesophageal reflux disease)   . Hypertension    past history of hypertension  . Hypoalbuminemia   . Personal history of chemotherapy June 2013 , and  March 2014  . Weight loss     PAST SURGICAL HISTORY :   Past Surgical History:  Procedure Laterality Date  . ABDOMINAL HYSTERECTOMY    . bladder tacking    . INGUINAL LYMPH NODE BIOPSY Right 04/21/2016  INGUINAL LYMPH NODE BIOPSY;  REACTIVE LYMPHADENOPATHY.  Robert Bellow, MD;  [facility] ORS;General;  Laterality: Right;  . 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 PORTA CATH REMOVAL N/A 09/03/2017   Procedure: PORTA CATH REMOVAL;  Surgeon: Algernon Huxley, MD;  Location: [facility];   Service: Cardiovascular;  Laterality: N/A;  . PORTACATH PLACEMENT  2013  . RECTAL PROLAPSE REPAIR    . TUBAL LIGATION      FAMILY HISTORY :   Family History  Adopted: Yes  Family history unknown: Yes    SOCIAL HISTORY:   Social History   Tobacco Use  . Smoking status: Former Smoker    Packs/day: 1.00    Types: Cigarettes    Quit date: 12/16/1998    Years since quitting: 20.1  . Smokeless tobacco: Never Used  Substance Use Topics  . Alcohol use: Yes    Comment: Occassional  . Drug use: No    ALLERGIES:  is allergic to penicillins.  MEDICATIONS:  Current Outpatient Medications  Medication Sig Dispense Refill  . albuterol (PROVENTIL HFA;VENTOLIN HFA) 108 (90 Base) MCG/ACT inhaler Inhale 2 puffs into the lungs every 6 (six) hours as needed for wheezing or shortness of breath. 1 Inhaler 2   No current facility-administered medications for this visit.    Facility-Administered Medications Ordered in Other Visits  Medication Dose Route Frequency Provider Last Rate Last Dose  . heparin lock flush 100 unit/mL  500 Units Intracatheter PRN Charlaine Dalton R, MD      . sodium chloride flush (NS) 0.9 % injection 10 mL  10 mL Intravenous PRN Charlaine Dalton R, MD      . sodium chloride flush (NS) 0.9 % injection 10 mL  10 mL Intracatheter PRN Cammie Sickle, MD        PHYSICAL EXAMINATION: ECOG PERFORMANCE STATUS: 0 - Asymptomatic  BP 118/81   Pulse 79   Temp (!) 95.4 F (35.2 C)   Resp 16   Wt 210 lb 9.6 oz (95.5 kg)   BMI 32.02 kg/m   Filed Weights   02/03/19 1348  Weight: 210 lb 9.6 oz (95.5 kg)    Physical Exam  Constitutional: She is oriented to person, place, and time and well-developed, well-nourished, and in no distress.  Accompanied by daughter.  HENT:  Head: Normocephalic and atraumatic.  Mouth/Throat: Oropharynx is clear and moist. No oropharyngeal exudate.  Eyes: Pupils are equal, round, and reactive to light.  Neck: Normal range of  motion. Neck supple.  Cardiovascular: Normal rate and regular rhythm.  Pulmonary/Chest: No respiratory distress. She has no wheezes.  Abdominal: Soft. Bowel sounds are normal. She exhibits no distension and no mass. There is no abdominal tenderness. There is no rebound and no guarding.  Musculoskeletal: Normal range of motion.        General: No tenderness or edema.  Neurological: She is alert and oriented to person, place, and time.  Skin: Skin is warm.  A cold sore noted on the left lower lip  Psychiatric: Affect normal.    I have reviewed the data as listed    Component Value Date/Time   NA 140 02/03/2019 1325   NA 141 08/19/2014 1038   K 4.0 02/03/2019 1325   K 4.3 08/19/2014 1038   CL 106 02/03/2019 1325   CL 103 08/19/2014 1038   CO2 27 02/03/2019 1325   CO2 29 08/19/2014 1038   GLUCOSE 120 (H) 02/03/2019 1325   GLUCOSE 103 (H) 08/19/2014  1038   BUN 11 02/03/2019 1325   BUN 9 08/19/2014 1038   CREATININE 0.92 02/03/2019 1325   CREATININE 1.07 08/19/2014 1038   CALCIUM 8.4 (L) 02/03/2019 1325   CALCIUM 8.6 08/19/2014 1038   PROT 7.5 02/03/2019 1325   PROT 7.0 08/19/2014 1038   ALBUMIN 4.0 02/03/2019 1325   ALBUMIN 3.7 08/19/2014 1038   AST 24 02/03/2019 1325   AST 18 08/19/2014 1038   ALT 25 02/03/2019 1325   ALT 36 08/19/2014 1038   ALKPHOS 61 02/03/2019 1325   ALKPHOS 81 08/19/2014 1038   BILITOT 0.7 02/03/2019 1325   BILITOT 0.5 08/19/2014 1038   GFRNONAA >60 02/03/2019 1325   GFRNONAA 59 (L) 08/19/2014 1038   GFRNONAA >60 03/16/2014 0911   GFRAA >60 02/03/2019 1325   GFRAA >60 08/19/2014 1038   GFRAA >60 03/16/2014 0911    No results found for: SPEP, UPEP  Lab Results  Component Value Date   WBC 6.8 02/03/2019   NEUTROABS 3.8 02/03/2019   HGB 12.6 02/03/2019   HCT 37.6 02/03/2019   MCV 92.6 02/03/2019   PLT 218 02/03/2019      Chemistry      Component Value Date/Time   NA 140 02/03/2019 1325   NA 141 08/19/2014 1038   K 4.0 02/03/2019 1325    K 4.3 08/19/2014 1038   CL 106 02/03/2019 1325   CL 103 08/19/2014 1038   CO2 27 02/03/2019 1325   CO2 29 08/19/2014 1038   BUN 11 02/03/2019 1325   BUN 9 08/19/2014 1038   CREATININE 0.92 02/03/2019 1325   CREATININE 1.07 08/19/2014 1038      Component Value Date/Time   CALCIUM 8.4 (L) 02/03/2019 1325   CALCIUM 8.6 08/19/2014 1038   ALKPHOS 61 02/03/2019 1325   ALKPHOS 81 08/19/2014 1038   AST 24 02/03/2019 1325   AST 18 08/19/2014 1038   ALT 25 02/03/2019 1325   ALT 36 08/19/2014 1038   BILITOT 0.7 02/03/2019 1325   BILITOT 0.5 08/19/2014 1038       On: 02/02/2017 11:56  RADIOGRAPHIC STUDIES: I have personally reviewed the radiological images as listed and agreed with the findings in the report. No results found.   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 Stable; clinically no evidence of recurrence.  Continue surveillance without imaging.  #History Low back pain-MRI lumbar spine normal- in July 2018.  Stable.  # Fasting Blood sugars-120; discussed risk of diabetes/prevention; diet changes/weight loss.  I would not recommend any pharmacologic intervention at the time.  Discussed importance of confirmation with repeat blood work  # Low calcium-calcium 8.4.  Recommend calcium plus vitamin D.  # DISPOSITION: #  follow up in 6 months-MD/labs- cbc/cmp/LDH-Dr.B   Orders Placed This Encounter  Procedures  . CBC with Differential    Standing Status:   Future    Standing Expiration Date:   02/03/2020  . Comprehensive metabolic panel    Standing Status:   Future    Standing Expiration Date:   02/03/2020  . Lactate dehydrogenase    Standing Status:   Future    Standing Expiration Date:   02/03/2020   All questions were answered. The patient knows to call the clinic with any problems, questions or  concerns.      Cammie Sickle,  MD 02/03/2019 7:36 PM

## 2019-02-03 NOTE — ED Triage Notes (Signed)
Suture removal to hand.

## 2019-02-03 NOTE — ED Provider Notes (Signed)
[facility] Emergency Department Provider Note  ________________  Time seen: 1505  I have reviewed the triage vital signs and the nursing notes.   HISTORY  Chief Complaint Suture / Staple Removal   HPI  Bethany Kelly is a 48 y.o. female who presents to the emergency department for suture removal. No complications or current compalints.   Past Medical History:  Diagnosis Date  . Anemia   . Anxiety   . Chronic kidney disease    kidney stones  . Chronic sinusitis   . 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 Fatigue   . GERD (gastroesophageal reflux disease)   . Hypertension    past history of hypertension  . Hypoalbuminemia   . Personal history of chemotherapy June 2013 , and  March 2014  . Weight loss     Patient Active Problem List   Diagnosis Date Noted  . Closed fracture of lateral malleolus 02/14/2017  . Back pain, subacute 02/06/2017  . 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 Chronic sinusitis   . Patellofemoral stress syndrome 04/13/2014  . Lateral epicondylitis of elbow 04/02/2014  . GERD (gastroesophageal reflux disease) 10/29/2012  . Hypertension 10/28/2012  . DLBCL (diffuse large B cell lymphoma) ([place]) 10/28/2012  . 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 ABDOMINAL HYSTERECTOMY    . bladder tacking    . INGUINAL LYMPH NODE BIOPSY Right 04/21/2016    INGUINAL LYMPH NODE BIOPSY;  REACTIVE LYMPHADENOPATHY.  Bethany Bellow, MD;  [facility] ORS;General;  Laterality: Right;  . 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 PORTA CATH REMOVAL N/A 09/03/2017   Procedure: PORTA CATH REMOVAL;  Surgeon: Algernon Huxley, MD;  Location: [facility];  Service: Cardiovascular;  Laterality: N/A;  . PORTACATH PLACEMENT  2013  . RECTAL PROLAPSE REPAIR    . TUBAL LIGATION      Current Outpatient Rx  .  Order #: [id number] Class: Normal    Allergies Penicillins  Family History  Adopted: Yes  Family history unknown: Yes    Social History Social History   Tobacco Use  . Smoking status: Former Smoker    Packs/day: 1.00    Types: Cigarettes    Quit date: 12/16/1998    Years since quitting: 20.1  . Smokeless tobacco: Never Used  Substance Use Topics  . Alcohol use: Yes    Comment: Occassional  . Drug use: No    Review of Systems  Constitutional: Denies fever.  HEENT: No change from baseline Respiratory: No cough or shortness of breath Musculoskeletal: No pain. Skin: healing wound; pain gradually resolving.  ____________________________________________   PHYSICAL EXAM:  VITAL SIGNS: ED Triage Vitals  Enc Vitals Group     BP 02/03/19 1509 140/78     Pulse Rate 02/03/19 1509 80     Resp 02/03/19 1509 18     Temp 02/03/19 1509 98 F (36.7 C)     Temp Source 02/03/19 1509 Oral     SpO2 02/03/19 1509 98 %     Weight 02/03/19 1454 210 lb 8.6 oz (95.5 kg)     Height --      Head Circumference --      Peak Flow --      Pain Score 02/03/19 1454 0     Pain Loc --      Pain  Edu? --      Excl. in [place]? --     Constitutional: Appears well. No distress HEENT: Atraumtaic, normal appearance, sclera normal, voice normal. Respiratory: Respirations even and unlabored.  Cardiovascular: Capillary refill normal. Peripheral pulses 2+ Musculoskeletal: Full ROM x 4. Skin: Well approximated. No evidence of infection or cellulitis. Neurovascular: Gait steady; Alert and oriented x 4.   PROCEDURES  Procedure(s) performed: SUTURE REMOVAL Performed by: Tyler Deis Students  Location details: Left ring and base of left thumb  Wound Appearance: clean  Sutures/Staples Removed: 5  Facility: sutures placed in this facility  Patient tolerance: Patient tolerated the procedure well with no immediate complications.    ____________________________________________   INITIAL IMPRESSION /  ASSESSMENT AND PLAN / ED COURSE  Pertinent labs & imaging results that were available during my care of the patient were reviewed by me and considered in my medical decision making (see chart for details).   Wound care discussed. Patient advised to keep covered with sunscreen. Patient was advised to return to the ER for symptoms that change or worsen if unable to schedule an appointment with primary care.  ____________________________________________   FINAL CLINICAL IMPRESSION(S) / ED DIAGNOSES  Final diagnoses:  Visit for suture removal      Victorino Dike, FNP 02/03/19 1512    Carrie Mew, MD 02/05/19 1554

## 2019-08-05 NOTE — Progress Notes (Signed)
Left message to call back  

## 2019-08-06 ENCOUNTER — Other Ambulatory Visit: Payer: Self-pay

## 2019-08-06 ENCOUNTER — Inpatient Hospital Stay: Payer: 59 | Attending: Internal Medicine | Admitting: Internal Medicine

## 2019-08-06 ENCOUNTER — Inpatient Hospital Stay: Payer: 59

## 2019-08-06 DIAGNOSIS — C8337 Diffuse large B-cell lymphoma, spleen: Secondary | ICD-10-CM | POA: Diagnosis not present

## 2019-08-06 DIAGNOSIS — Z9221 Personal history of antineoplastic chemotherapy: Secondary | ICD-10-CM | POA: Insufficient documentation

## 2019-08-06 DIAGNOSIS — Z9484 Stem cells transplant status: Secondary | ICD-10-CM | POA: Diagnosis not present

## 2019-08-06 DIAGNOSIS — Z8572 Personal history of non-Hodgkin lymphomas: Secondary | ICD-10-CM | POA: Diagnosis present

## 2019-08-06 DIAGNOSIS — Z90710 Acquired absence of both cervix and uterus: Secondary | ICD-10-CM | POA: Insufficient documentation

## 2019-08-06 DIAGNOSIS — Z87891 Personal history of nicotine dependence: Secondary | ICD-10-CM | POA: Insufficient documentation

## 2019-08-06 DIAGNOSIS — M549 Dorsalgia, unspecified: Secondary | ICD-10-CM | POA: Diagnosis not present

## 2019-08-06 DIAGNOSIS — Z79899 Other long term (current) drug therapy: Secondary | ICD-10-CM | POA: Diagnosis not present

## 2019-08-06 DIAGNOSIS — I10 Essential (primary) hypertension: Secondary | ICD-10-CM | POA: Diagnosis not present

## 2019-08-06 DIAGNOSIS — N189 Chronic kidney disease, unspecified: Secondary | ICD-10-CM | POA: Diagnosis not present

## 2019-08-06 DIAGNOSIS — G8929 Other chronic pain: Secondary | ICD-10-CM | POA: Diagnosis not present

## 2019-08-06 LAB — COMPREHENSIVE METABOLIC PANEL
ALT: 24 U/L (ref 0–44)
AST: 20 U/L (ref 15–41)
Albumin: 4.5 g/dL (ref 3.5–5.0)
Alkaline Phosphatase: 63 U/L (ref 38–126)
Anion gap: 9 (ref 5–15)
BUN: 11 mg/dL (ref 6–20)
CO2: 27 mmol/L (ref 22–32)
Calcium: 9.1 mg/dL (ref 8.9–10.3)
Chloride: 103 mmol/L (ref 98–111)
Creatinine, Ser: 0.89 mg/dL (ref 0.44–1.00)
GFR calc Af Amer: >60 mL/min (ref >60)
GFR calc non Af Amer: >60 mL/min (ref >60)
Glucose, Bld: 99 mg/dL (ref 70–99)
Potassium: 4.2 mmol/L (ref 3.5–5.1)
Sodium: 139 mmol/L (ref 135–145)
Total Bilirubin: 1 mg/dL (ref 0.3–1.2)
Total Protein: 7.3 g/dL (ref 6.5–8.1)

## 2019-08-06 LAB — CBC WITH DIFFERENTIAL/PLATELET
Abs Immature Granulocytes: 0.02 10*3/uL (ref 0.00–0.07)
Basophils Absolute: 0 10*3/uL (ref 0.0–0.1)
Basophils Relative: 0 %
Eosinophils Absolute: 0 10*3/uL (ref 0.0–0.5)
Eosinophils Relative: 1 %
HCT: 39.8 % (ref 36.0–46.0)
Hemoglobin: 13.1 g/dL (ref 12.0–15.0)
Immature Granulocytes: 0 %
Lymphocytes Relative: 31 %
Lymphs Abs: 1.8 10*3/uL (ref 0.7–4.0)
MCH: 31 pg (ref 26.0–34.0)
MCHC: 32.9 g/dL (ref 30.0–36.0)
MCV: 94.1 fL (ref 80.0–100.0)
Monocytes Absolute: 0.3 10*3/uL (ref 0.1–1.0)
Monocytes Relative: 5 %
Neutro Abs: 3.7 10*3/uL (ref 1.7–7.7)
Neutrophils Relative %: 63 %
Platelets: 212 10*3/uL (ref 150–400)
RBC: 4.23 MIL/uL (ref 3.87–5.11)
RDW: 12.1 % (ref 11.5–15.5)
WBC: 5.8 10*3/uL (ref 4.0–10.5)
nRBC: 0 % (ref 0.0–0.2)

## 2019-08-06 LAB — LACTATE DEHYDROGENASE: LDH: 163 U/L (ref 98–192)

## 2019-08-06 NOTE — Progress Notes (Signed)
Bethany Kelly OFFICE PROGRESS NOTE  Patient Care Team: Patient, No Pcp Per as PCP - General ([place]) Cammie Sickle, MD as Consulting Physician (Internal Medicine) Bary Castilla Forest Gleason, MD (General Surgery)  Cancer Staging No matching staging information was found for the patient.   Oncology History Overview Note  Chief Complaint/Diagnosis:   # 2013-  admitted with hypercalcemia and abnormal CT scan of the abdomen  2. 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 Patient was started on RCHOP chemotherapy June of 2013 4. High-dose methotrexate in alternate cycles starting from July 29. 5.last dose of all chemotherapy with R. CHOP (April 29, 2012) patient received total 6 cycles of chemotherapy and tolerated treatment very well without any significant side effect 6.and underwent high dose chemotherapy and stem cell support High-dose chemotherapy with BEAM regimen.  (March 24: 014  # SEP 13th 2017- ~1.2cm R Ingiunal LN; 43mm ex Iliac LN ? 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  female pleasant patient above history of diffuse large B-cell lymphoma status post autologous stem cell transplant in 2014 is here for follow-up.    Patient stated that she has been recently divorced.  She is coping fairly well.  She  continues to chronic back pain worse with movement.  No new lumps or bumps but no weight loss.  Review of Systems  Constitutional: Negative for chills, diaphoresis, fever, malaise/fatigue and weight loss.  HENT: Negative for nosebleeds and sore throat.   Eyes: Negative for double vision.  Respiratory: Negative for cough, hemoptysis, sputum production, shortness of breath and wheezing.   Cardiovascular: Negative for chest pain, palpitations, orthopnea and leg swelling.  Gastrointestinal: Negative for abdominal pain, blood in stool, constipation, diarrhea, heartburn, melena, nausea and vomiting.  Genitourinary: Negative for dysuria, frequency and urgency.  Musculoskeletal: Positive for back pain and joint pain.  Skin: Negative for itching.  Neurological: Negative for dizziness, tingling, focal weakness, weakness and headaches.  Endo/Heme/Allergies: Does not bruise/bleed easily.  Psychiatric/Behavioral: Negative for depression. The patient is not nervous/anxious and does not have insomnia.      PAST MEDICAL HISTORY :  Past Medical History:  Diagnosis Date  . Anemia   . Anxiety   . Chronic kidney disease    kidney stones  . Chronic sinusitis   . 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 Fatigue   . GERD (gastroesophageal reflux disease)   . Hypertension    past history of hypertension  . Hypoalbuminemia   . Personal history of chemotherapy June 2013 , and  March 2014  . Weight loss     PAST SURGICAL HISTORY :   Past Surgical History:  Procedure Laterality Date  . ABDOMINAL HYSTERECTOMY    . bladder tacking    . INGUINAL LYMPH NODE BIOPSY Right 04/21/2016    INGUINAL LYMPH  NODE BIOPSY;  REACTIVE LYMPHADENOPATHY.  Robert Bellow, MD;  [facility] ORS;General;  Laterality: Right;  . 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 PORTA CATH REMOVAL N/A 09/03/2017   Procedure: PORTA CATH REMOVAL;  Surgeon: Algernon Huxley, MD;  Location: [place] [facility];  Service: Cardiovascular;   Laterality: N/A;  . PORTACATH PLACEMENT  2013  . RECTAL PROLAPSE REPAIR    . TUBAL LIGATION      FAMILY HISTORY :   Family History  Adopted: Yes  Family history unknown: Yes    SOCIAL HISTORY:   Social History   Tobacco Use  . Smoking status: Former Smoker    Packs/day: 1.00    Types: Cigarettes    Quit date: 12/16/1998    Years since quitting: 20.6  . Smokeless tobacco: Never Used  Substance Use Topics  . Alcohol use: Yes    Comment: Occassional  . Drug use: No    ALLERGIES:  is allergic to penicillins.  MEDICATIONS:  Current Outpatient Medications  Medication Sig Dispense Refill  . albuterol (PROVENTIL HFA;VENTOLIN HFA) 108 (90 Base) MCG/ACT inhaler Inhale 2 puffs into the lungs every 6 (six) hours as needed for wheezing or shortness of breath. 1 Inhaler 2   No current facility-administered medications for this visit.   Facility-Administered Medications Ordered in Other Visits  Medication Dose Route Frequency Provider Last Rate Last Admin  . heparin lock flush 100 unit/mL  500 Units Intracatheter PRN Charlaine Dalton R, MD      . sodium chloride flush (NS) 0.9 % injection 10 mL  10 mL Intravenous PRN Charlaine Dalton R, MD      . sodium chloride flush (NS) 0.9 % injection 10 mL  10 mL Intracatheter PRN Cammie Sickle, MD        PHYSICAL EXAMINATION: ECOG PERFORMANCE STATUS: 0 - Asymptomatic  BP 123/88   Pulse 87   Temp 98.1 F (36.7 C) (Tympanic)   Resp 20   Wt 215 lb (97.5 kg)   BMI 32.69 kg/m   Filed Weights   08/06/19 1435  Weight: 215 lb (97.5 kg)    Physical Exam  Constitutional: She is oriented to person, place, and time and well-developed, well-nourished, and in no distress.  Accompanied by daughter.  HENT:  Head: Normocephalic and atraumatic.  Mouth/Throat: Oropharynx is clear and moist. No oropharyngeal exudate.  Eyes: Pupils are equal, round, and reactive to light.  Cardiovascular: Normal rate and regular rhythm.   Pulmonary/Chest: No respiratory distress. She has no wheezes.  Abdominal: Soft. Bowel sounds are normal. She exhibits no distension and no mass. There is no abdominal tenderness. There is no rebound and no guarding.  Musculoskeletal:        General: No tenderness or edema. Normal range of motion.     Cervical back: Normal range of motion and neck supple.  Neurological: She is alert and oriented to person, place, and time.  Skin: Skin is warm.  A cold sore noted on the left lower lip  Psychiatric: Affect normal.    I have reviewed the data as listed    Component Value Date/Time   NA 139 08/06/2019 1413   NA 141 08/19/2014 1038   K 4.2 08/06/2019 1413   K 4.3 08/19/2014 1038   CL 103 08/06/2019 1413   CL 103 08/19/2014 1038   CO2 27 08/06/2019 1413   CO2 29 08/19/2014 1038   GLUCOSE 99 08/06/2019 1413   GLUCOSE 103 (H) 08/19/2014 1038  BUN 11 08/06/2019 1413   BUN 9 08/19/2014 1038   CREATININE 0.89 08/06/2019 1413   CREATININE 1.07 08/19/2014 1038   CALCIUM 9.1 08/06/2019 1413   CALCIUM 8.6 08/19/2014 1038   PROT 7.3 08/06/2019 1413   PROT 7.0 08/19/2014 1038   ALBUMIN 4.5 08/06/2019 1413   ALBUMIN 3.7 08/19/2014 1038   AST 20 08/06/2019 1413   AST 18 08/19/2014 1038   ALT 24 08/06/2019 1413   ALT 36 08/19/2014 1038   ALKPHOS 63 08/06/2019 1413   ALKPHOS 81 08/19/2014 1038   BILITOT 1.0 08/06/2019 1413   BILITOT 0.5 08/19/2014 1038   GFRNONAA >60 08/06/2019 1413   GFRNONAA 59 (L) 08/19/2014 1038   GFRNONAA >60 03/16/2014 0911   GFRAA >60 08/06/2019 1413   GFRAA >60 08/19/2014 1038   GFRAA >60 03/16/2014 0911    No results found for: SPEP, UPEP  Lab Results  Component Value Date   WBC 5.8 08/06/2019   NEUTROABS 3.7 08/06/2019   HGB 13.1 08/06/2019   HCT 39.8 08/06/2019   MCV 94.1 08/06/2019   PLT 212 08/06/2019      Chemistry      Component Value Date/Time   NA 139 08/06/2019 1413   NA 141 08/19/2014 1038   K 4.2 08/06/2019 1413   K 4.3 08/19/2014  1038   CL 103 08/06/2019 1413   CL 103 08/19/2014 1038   CO2 27 08/06/2019 1413   CO2 29 08/19/2014 1038   BUN 11 08/06/2019 1413   BUN 9 08/19/2014 1038   CREATININE 0.89 08/06/2019 1413   CREATININE 1.07 08/19/2014 1038      Component Value Date/Time   CALCIUM 9.1 08/06/2019 1413   CALCIUM 8.6 08/19/2014 1038   ALKPHOS 63 08/06/2019 1413   ALKPHOS 81 08/19/2014 1038   AST 20 08/06/2019 1413   AST 18 08/19/2014 1038   ALT 24 08/06/2019 1413   ALT 36 08/19/2014 1038   BILITOT 1.0 08/06/2019 1413   BILITOT 0.5 08/19/2014 1038       On: 02/02/2017 11:56  RADIOGRAPHIC STUDIES: I have personally reviewed the radiological images as listed and agreed with the findings in the report. No results found.   ASSESSMENT & PLAN:  Diffuse large B-cell lymphoma of spleen ([place]) #  Diffuse large B-cell lymphoma status post autologous transplant march 2014;  July 10 th 2018- CT C/A/P- NED; except for STABLE subcm left pelvic LN. STABLE;  clinically no evidence of recurrence.  Continue surveillance without imaging.  #History Low back pain-MRI lumbar spine normal- in July 2018.  STABLE.   # Fasting Blood sugars-99- improved.  # Low calcium-calcium 9.1; improved.   # # I discussed regarding Covid-19 precautions.  I reviewed the vaccine effectiveness and potential side effects in detail.  Also discussed long-term effectiveness and safety profile are unclear at this time.  I discussed December, 2020 ASC[employer] position statement-that all patients are recommended COVID-19 vaccinations [when available]-as long as they do not have allergy to components of the vaccine.  However, I think the benefits of the vaccination outweigh the potential risks. Re: [id number] vaccination.  For more information/scheduling recommend call [phone], 8:30am-4:30pm.    # DISPOSITION: #  follow up in 12 months-MD/labs- cbc/cmp/LDH-Dr.B   Orders Placed This Encounter  Procedures  . CBC  with Differential    Standing Status:   Future    Standing Expiration Date:   02/02/2021  . Comprehensive metabolic panel    Standing Status:  Future    Standing Expiration Date:   02/02/2021  . Lactate dehydrogenase    Standing Status:   Future    Standing Expiration Date:   02/02/2021   All questions were answered. The patient knows to call the clinic with any problems, questions or concerns.      Cammie Sickle, MD 08/06/2019 3:34 PM

## 2019-08-06 NOTE — Assessment & Plan Note (Addendum)
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 STABLE;  clinically no evidence of recurrence.  Continue surveillance without imaging.  #History Low back pain-MRI lumbar spine normal- in July 2018.  STABLE.   # Fasting Blood sugars-99- improved.  # Low calcium-calcium 9.1; improved.   # # I discussed regarding Covid-19 precautions.  I reviewed the vaccine effectiveness and potential side effects in detail.  Also discussed long-term effectiveness and safety profile are unclear at this time.  I discussed December, 2020 ASCO position statement-that all patients are recommended COVID-19 vaccinations [when available]-as long as they do not have allergy to components of the vaccine.  However, I think the benefits of the vaccination outweigh the potential risks. Re: [id number] vaccination.  For more information/scheduling recommend call [place]548 [phone], 8:30am-4:30pm.    # DISPOSITION: #  follow up in 12 months-MD/labs- cbc/cmp/LDH-Dr.B

## 2020-04-28 ENCOUNTER — Other Ambulatory Visit: Payer: Self-pay | Admitting: *Deleted

## 2020-04-28 ENCOUNTER — Telehealth: Payer: Self-pay | Admitting: Internal Medicine

## 2020-04-28 DIAGNOSIS — Z1231 Encounter for screening mammogram for malignant neoplasm of breast: Secondary | ICD-10-CM

## 2020-04-28 DIAGNOSIS — C8337 Diffuse large B-cell lymphoma, spleen: Secondary | ICD-10-CM

## 2020-04-28 NOTE — Telephone Encounter (Signed)
Pt called requesting mammo order be entered. She called to schedule one and they told her no order was there. Please call patient once order is entered so she can get it scheduled.

## 2020-04-28 NOTE — Telephone Encounter (Signed)
Dr. Jacinto Reap - Please advise- patient has not had mammogram since 2018. We follow her for lymphoma. I don't see where she has had a mammo anywhere else.

## 2020-04-28 NOTE — Telephone Encounter (Signed)
Patient does not have a pcp. I spoke with Dr. Rogue Bussing, who agreed to order to order mammo.  Colette - please schedule mammo. Thanks.

## 2020-07-31 ENCOUNTER — Other Ambulatory Visit: Payer: Self-pay

## 2020-07-31 ENCOUNTER — Emergency Department
Admission: EM | Admit: 2020-07-31 | Discharge: 2020-07-31 | Disposition: A | Discharge status: Home / Self Care | Payer: 59 | Attending: Emergency Medicine | Admitting: Emergency Medicine

## 2020-07-31 ENCOUNTER — Encounter: Payer: Self-pay | Admitting: Intensive Care

## 2020-07-31 DIAGNOSIS — Z79899 Other long term (current) drug therapy: Secondary | ICD-10-CM | POA: Diagnosis not present

## 2020-07-31 DIAGNOSIS — Z87891 Personal history of nicotine dependence: Secondary | ICD-10-CM | POA: Insufficient documentation

## 2020-07-31 DIAGNOSIS — R42 Dizziness and giddiness: Secondary | ICD-10-CM | POA: Diagnosis not present

## 2020-07-31 DIAGNOSIS — N189 Chronic kidney disease, unspecified: Secondary | ICD-10-CM | POA: Diagnosis not present

## 2020-07-31 DIAGNOSIS — R202 Paresthesia of skin: Secondary | ICD-10-CM | POA: Insufficient documentation

## 2020-07-31 DIAGNOSIS — F419 Anxiety disorder, unspecified: Secondary | ICD-10-CM | POA: Diagnosis not present

## 2020-07-31 DIAGNOSIS — I129 Hypertensive chronic kidney disease with stage 1 through stage 4 chronic kidney disease, or unspecified chronic kidney disease: Secondary | ICD-10-CM | POA: Insufficient documentation

## 2020-07-31 DIAGNOSIS — R251 Tremor, unspecified: Secondary | ICD-10-CM | POA: Insufficient documentation

## 2020-07-31 DIAGNOSIS — R519 Headache, unspecified: Secondary | ICD-10-CM | POA: Diagnosis not present

## 2020-07-31 LAB — CBC WITH DIFFERENTIAL/PLATELET
Abs Immature Granulocytes: 0.06 10*3/uL (ref 0.00–0.07)
Basophils Absolute: 0 10*3/uL (ref 0.0–0.1)
Basophils Relative: 0 %
Eosinophils Absolute: 0 10*3/uL (ref 0.0–0.5)
Eosinophils Relative: 0 %
HCT: 41.7 % (ref 36.0–46.0)
Hemoglobin: 14.1 g/dL (ref 12.0–15.0)
Immature Granulocytes: 1 %
Lymphocytes Relative: 12 %
Lymphs Abs: 1.1 10*3/uL (ref 0.7–4.0)
MCH: 31.3 pg (ref 26.0–34.0)
MCHC: 33.8 g/dL (ref 30.0–36.0)
MCV: 92.5 fL (ref 80.0–100.0)
Monocytes Absolute: 0.2 10*3/uL (ref 0.1–1.0)
Monocytes Relative: 2 %
Neutro Abs: 8.2 10*3/uL — ABNORMAL HIGH (ref 1.7–7.7)
Neutrophils Relative %: 85 %
Platelets: 229 10*3/uL (ref 150–400)
RBC: 4.51 MIL/uL (ref 3.87–5.11)
RDW: 12.2 % (ref 11.5–15.5)
WBC: 9.7 10*3/uL (ref 4.0–10.5)
nRBC: 0 % (ref 0.0–0.2)

## 2020-07-31 LAB — COMPREHENSIVE METABOLIC PANEL
ALT: 32 U/L (ref 0–44)
AST: 32 U/L (ref 15–41)
Albumin: 4.1 g/dL (ref 3.5–5.0)
Alkaline Phosphatase: 67 U/L (ref 38–126)
Anion gap: 11 (ref 5–15)
BUN: 12 mg/dL (ref 6–20)
CO2: 25 mmol/L (ref 22–32)
Calcium: 9 mg/dL (ref 8.9–10.3)
Chloride: 102 mmol/L (ref 98–111)
Creatinine, Ser: 0.88 mg/dL (ref 0.44–1.00)
GFR, Estimated: >60 mL/min (ref >60)
Glucose, Bld: 172 mg/dL — ABNORMAL HIGH (ref 70–99)
Potassium: 3.7 mmol/L (ref 3.5–5.1)
Sodium: 138 mmol/L (ref 135–145)
Total Bilirubin: 0.9 mg/dL (ref 0.3–1.2)
Total Protein: 7.6 g/dL (ref 6.5–8.1)

## 2020-07-31 MED ORDER — DIAZEPAM 2 MG PO TABS: 1.0000 mg | ORAL_TABLET | Freq: Three times a day (TID) | ORAL | 0 refills | Status: DC | PRN

## 2020-07-31 MED ORDER — METOPROLOL TARTRATE 25 MG PO TABS: 25.0000 mg | ORAL_TABLET | Freq: Two times a day (BID) | ORAL | 2 refills | Status: DC

## 2020-07-31 NOTE — ED Provider Notes (Signed)
[facility] Emergency Department Provider Note ____________________________________________  Time seen: 1637  I have reviewed the triage vital signs and the nursing notes.  HISTORY  Chief Complaint  Anxiety, Shaking, and Headache  HPI Bethany Kelly is a 50 y.o. female presents  present to the ED for evaluation of some mild frontal head pressure, and what she describes as "shaking on the inside."  Patient reports she has recently been started on blood pressure medicine has been taken lisinopril and HCTZ as directed.  She awoke this morning and felt generally unwell, as well as experiencing some nervousness and anxiety.  She reports that her hands were shaking, and after she checked her blood pressure, she noted it was slightly elevated.  She has noted since that time which she describes a sensation of internal shaking.  Denies any frank chest pain, shortness of breath, weakness, dizziness, vertigo, or paresthesias.  She has some mild lightheadedness, but denies any syncope, nausea, vomiting, or abdominal pain.  She also denies any frank headache, visual disturbance, tinnitus, vertigo, or aura.  Patient has a history of non-Hodgkin's lymphoma which has been in remission for several years.  She takes no other daily medications, denies any other serious medical conditions.  She presents for evaluation of her symptoms, and is extremely concerned for the cause of her subjective complaints.  Past Medical History:  Diagnosis Date  . Anemia   . Anxiety   . Chronic kidney disease    kidney stones  . Chronic sinusitis   . 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 Fatigue   . GERD (gastroesophageal reflux disease)   . Hypertension    past history of hypertension  . Hypoalbuminemia   . Personal history of chemotherapy June 2013 , and  March 2014  . Weight loss     Patient Active Problem List   Diagnosis Date Noted  . Closed fracture of lateral malleolus 02/14/2017  .  Back pain, subacute 02/06/2017  . 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 Chronic sinusitis   . Patellofemoral stress syndrome 04/13/2014  . Lateral epicondylitis of elbow 04/02/2014  . GERD (gastroesophageal reflux disease) 10/29/2012  . Hypertension 10/28/2012  . DLBCL (diffuse large B cell lymphoma) ([place]) 10/28/2012  . 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 ABDOMINAL HYSTERECTOMY    . bladder tacking    . INGUINAL LYMPH NODE BIOPSY Right 04/21/2016    INGUINAL LYMPH NODE BIOPSY;  REACTIVE LYMPHADENOPATHY.  Robert Bellow, MD;  [facility] ORS;General;  Laterality: Right;  . 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 PORTA CATH REMOVAL N/A 09/03/2017   Procedure: PORTA CATH REMOVAL;  Surgeon: Algernon Huxley, MD;  Location: [place] [facility];  Service: Cardiovascular;  Laterality: N/A;  . PORTACATH PLACEMENT  2013  . RECTAL PROLAPSE REPAIR    . TUBAL LIGATION      Prior to Admission medications   Medication Sig Start Date End Date Taking? Authorizing Provider  diazepam (VALIUM) 2 MG tablet Take 0.5 tablets (1 mg total) by mouth every 8 (eight) hours as needed for anxiety. 07/31/20  Yes Terin Dierolf, Dannielle Karvonen, PA-C  hydrochlorothiazide (HYDRODIURIL) 25 MG tablet Take 12.5 mg by mouth daily.   Yes [provider]  lisinopril (ZESTRIL) 10 MG tablet Take 10 mg by mouth daily.   Yes [provider]  metoprolol tartrate (LOPRESSOR) 25 MG tablet Take 1 tablet (25 mg total) by mouth 2 (two) times daily. 07/31/20 10/29/20 Yes Moana Munford, Dannielle Karvonen, PA-C  albuterol (PROVENTIL HFA;VENTOLIN HFA) 108 (90 Base) MCG/ACT inhaler Inhale 2 puffs into the lungs every 6 (six) hours as needed for wheezing or shortness of breath. 10/09/18   Cammie Sickle, MD    Allergies Penicillins  Family History  Adopted: Yes  Family history unknown: Yes     Social History Social History   Tobacco Use  . Smoking status: Former Smoker    Packs/day: 1.00    Types: Cigarettes    Quit date: 12/16/1998    Years since quitting: 21.6  . Smokeless tobacco: Never Used  Substance Use Topics  . Alcohol use: Yes    Alcohol/week: 4.0 standard drinks    Types: 4 Glasses of wine per week  . Drug use: No    Review of Systems  Constitutional: Negative for fever. Eyes: Negative for visual changes. ENT: Negative for sore throat. Cardiovascular: Negative for chest pain. Respiratory: Negative for shortness of breath. Gastrointestinal: Negative for abdominal pain, vomiting and diarrhea. Genitourinary: Negative for dysuria. Musculoskeletal: Negative for back pain. Skin: Negative for rash. Neurological: Negative for headaches, focal weakness or numbness. ____________________________________________  PHYSICAL EXAM:  VITAL SIGNS: ED Triage Vitals [07/31/20 1349]  Enc Vitals Group     BP (!) 159/100     Pulse Rate 91     Resp 18     Temp 98.6 F (37 C)     Temp Source Oral     SpO2 98 %     Weight 212 lb (96.2 kg)     Height 5\' 8"  (1.727 m)     Head Circumference      Peak Flow      Pain Score 3     Pain Loc      Pain Edu?      Excl. in [place]?     Constitutional: Alert and oriented. Well appearing and in no distress. GCS = 15 Head: Normocephalic and atraumatic. Eyes: Conjunctivae are normal. PERRL. Normal extraocular movements Cardiovascular: Normal rate, regular rhythm. Normal distal pulses. Respiratory: Normal respiratory effort. No wheezes/rales/rhonchi. Gastrointestinal: Soft and nontender. No distention. Musculoskeletal: Nontender with normal range of motion in all extremities.  Neurologic: Cranial nerves 2-12 grossly intact.  LE DTRs bilaterally.  Normal gait without ataxia. Normal speech and language. No gross focal neurologic deficits are appreciated. Skin:  Skin is warm, dry and intact. No rash noted. Psychiatric: Mood is  emotional and affect is anxious. Patient exhibits appropriate insight and judgment. ____________________________________________   LABS (pertinent positives/negatives) Labs Reviewed  CBC WITH DIFFERENTIAL/PLATELET - Abnormal; Notable for the following components:      Result Value   Neutro Abs 8.2 (*)    All other components within normal limits  COMPREHENSIVE METABOLIC PANEL - Abnormal; Notable for the following components:   Glucose, Bld 172 (*)    All other components within normal limits  ____________________________________________  PROCEDURES  Procedures ____________________________________________  INITIAL IMPRESSION / ASSESSMENT AND PLAN / ED COURSE  DDX: paresthesias, TIA, myalgias/myospasm, anxiety  Patient ED evaluation of obstructive complaints of shaking, which she describes as internal without any outward intention tremor, paresthesia, or myalgias.  Patient's exam is overall benign return at this time.  Her labs also without any acute abnormalities of electrolytes.  Blood pressure is slightly elevated but not consistent with hypertensive emergency.  We discussed at length the benign work-up at  this time as well as limited ability to work the patient up for her subjective complaints, but also I reminded the patient that there did not appear to be any serious life or limb threatening condition that would present as she has described.  Patient likely is experiencing some anxiety related to her concern for condition that she does not have.  I relayed to the patient that her medical history as well as previous cancer diagnosis and course, completely warrants her concern over her symptoms, but reminded her that her condition and presentation otherwise benign and did not indicate any serious underlying condition.  I suspect the patient's symptoms may be related to her anxiety and vigilance about her symptoms.  She was offered a dose of antianxiety medicine in the ED which she declined.   She is also offered a dose of a beta-blocker to help further manage her BP but she declined to take at this time.  A prescription for both Valium and metoprolol provided at this time.  Patient is encouraged to select and follow-up with a local primary provider patient is encouraged to return to the ED as needed.    Bethany Kelly was evaluated in Emergency Department on 07/31/2020 for the symptoms described in the history of present illness. She was evaluated in the context of the global COVID-19 pandemic, which necessitated consideration that the patient might be at risk for infection with the SARS-CoV-2 virus that causes COVID-19. Institutional protocols and algorithms that pertain to the evaluation of patients at risk for COVID-19 are in a state of rapid change based on information released by regulatory bodies including the CDC and federal and state organizations. These policies and algorithms were followed during the patient's care in the ED. ____________________________________________  FINAL CLINICAL IMPRESSION(S) / ED DIAGNOSES  Final diagnoses:  Anxiety  Paresthesia      Carmie End, Dannielle Karvonen, PA-C 07/31/20 1903    Nance Pear, MD 07/31/20 [phone]

## 2020-07-31 NOTE — ED Notes (Signed)
Pt states coming in for "internal shaking" that started today

## 2020-07-31 NOTE — Discharge Instructions (Addendum)
Your exam and labs are normal and reassuring. There is no serious condition that seems to be causing your symptoms. Take the prescription meds as directed. Continue to monitor and track your blood pressures. Select and follow-up with a local primary care provider for continued care.

## 2020-07-31 NOTE — ED Triage Notes (Signed)
Patient c/o shaking and head pressure that started this AM. Possible anxiety. Does not take anything for anxiety. States HX non hodgkin lymphoma and gets nervous of it coming back when "not feeling well." NAD noted. Patient seems anxious of many possible problems causing her to feel this way.

## 2020-08-06 ENCOUNTER — Other Ambulatory Visit: Payer: Self-pay

## 2020-08-06 ENCOUNTER — Encounter: Payer: Self-pay | Admitting: Internal Medicine

## 2020-08-06 ENCOUNTER — Inpatient Hospital Stay: Payer: 59 | Attending: Internal Medicine

## 2020-08-06 ENCOUNTER — Inpatient Hospital Stay (HOSPITAL_BASED_OUTPATIENT_CLINIC_OR_DEPARTMENT_OTHER): Payer: 59 | Admitting: Internal Medicine

## 2020-08-06 DIAGNOSIS — K219 Gastro-esophageal reflux disease without esophagitis: Secondary | ICD-10-CM | POA: Diagnosis not present

## 2020-08-06 DIAGNOSIS — Z9484 Stem cells transplant status: Secondary | ICD-10-CM | POA: Insufficient documentation

## 2020-08-06 DIAGNOSIS — E559 Vitamin D deficiency, unspecified: Secondary | ICD-10-CM | POA: Diagnosis not present

## 2020-08-06 DIAGNOSIS — I10 Essential (primary) hypertension: Secondary | ICD-10-CM | POA: Insufficient documentation

## 2020-08-06 DIAGNOSIS — F419 Anxiety disorder, unspecified: Secondary | ICD-10-CM | POA: Insufficient documentation

## 2020-08-06 DIAGNOSIS — Z87891 Personal history of nicotine dependence: Secondary | ICD-10-CM | POA: Insufficient documentation

## 2020-08-06 DIAGNOSIS — N189 Chronic kidney disease, unspecified: Secondary | ICD-10-CM | POA: Insufficient documentation

## 2020-08-06 DIAGNOSIS — R200 Anesthesia of skin: Secondary | ICD-10-CM | POA: Diagnosis not present

## 2020-08-06 DIAGNOSIS — Z90710 Acquired absence of both cervix and uterus: Secondary | ICD-10-CM | POA: Diagnosis not present

## 2020-08-06 DIAGNOSIS — Z79899 Other long term (current) drug therapy: Secondary | ICD-10-CM | POA: Insufficient documentation

## 2020-08-06 DIAGNOSIS — Z87442 Personal history of urinary calculi: Secondary | ICD-10-CM | POA: Diagnosis not present

## 2020-08-06 DIAGNOSIS — C8337 Diffuse large B-cell lymphoma, spleen: Secondary | ICD-10-CM

## 2020-08-06 DIAGNOSIS — Z8572 Personal history of non-Hodgkin lymphomas: Secondary | ICD-10-CM | POA: Diagnosis present

## 2020-08-06 DIAGNOSIS — Z9221 Personal history of antineoplastic chemotherapy: Secondary | ICD-10-CM | POA: Diagnosis not present

## 2020-08-06 LAB — COMPREHENSIVE METABOLIC PANEL
ALT: 36 U/L (ref 0–44)
AST: 37 U/L (ref 15–41)
Albumin: 4.6 g/dL (ref 3.5–5.0)
Alkaline Phosphatase: 60 U/L (ref 38–126)
Anion gap: 10 (ref 5–15)
BUN: 24 mg/dL — ABNORMAL HIGH (ref 6–20)
CO2: 28 mmol/L (ref 22–32)
Calcium: 9.3 mg/dL (ref 8.9–10.3)
Chloride: 97 mmol/L — ABNORMAL LOW (ref 98–111)
Creatinine, Ser: 1.31 mg/dL — ABNORMAL HIGH (ref 0.44–1.00)
GFR, Estimated: 49 mL/min — ABNORMAL LOW (ref >60)
Glucose, Bld: 96 mg/dL (ref 70–99)
Potassium: 3.8 mmol/L (ref 3.5–5.1)
Sodium: 135 mmol/L (ref 135–145)
Total Bilirubin: 0.8 mg/dL (ref 0.3–1.2)
Total Protein: 7.7 g/dL (ref 6.5–8.1)

## 2020-08-06 LAB — CBC WITH DIFFERENTIAL/PLATELET
Abs Immature Granulocytes: 0.02 10*3/uL (ref 0.00–0.07)
Basophils Absolute: 0 10*3/uL (ref 0.0–0.1)
Basophils Relative: 1 %
Eosinophils Absolute: 0 10*3/uL (ref 0.0–0.5)
Eosinophils Relative: 1 %
HCT: 38.7 % (ref 36.0–46.0)
Hemoglobin: 13.4 g/dL (ref 12.0–15.0)
Immature Granulocytes: 0 %
Lymphocytes Relative: 27 %
Lymphs Abs: 1.7 10*3/uL (ref 0.7–4.0)
MCH: 31.4 pg (ref 26.0–34.0)
MCHC: 34.6 g/dL (ref 30.0–36.0)
MCV: 90.6 fL (ref 80.0–100.0)
Monocytes Absolute: 0.5 10*3/uL (ref 0.1–1.0)
Monocytes Relative: 7 %
Neutro Abs: 4.1 10*3/uL (ref 1.7–7.7)
Neutrophils Relative %: 64 %
Platelets: 251 10*3/uL (ref 150–400)
RBC: 4.27 MIL/uL (ref 3.87–5.11)
RDW: 12.3 % (ref 11.5–15.5)
WBC: 6.3 10*3/uL (ref 4.0–10.5)
nRBC: 0 % (ref 0.0–0.2)

## 2020-08-06 LAB — MAGNESIUM: Magnesium: 2.1 mg/dL (ref 1.7–2.4)

## 2020-08-06 LAB — LACTATE DEHYDROGENASE: LDH: 184 U/L (ref 98–192)

## 2020-08-06 NOTE — Assessment & Plan Note (Addendum)
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 STABLE. clinically no evidence of recurrence.  Continue surveillance without imaging.  # Myalgias- ? Vit D deficiency- recommend vit D 5000/units a day.  Add magnesium levels.  # HTN [recent diagnosis- dec 2021]- continue BP medications as prescribed.  Recommend patient follow-up with PCP; needs to set up appointment with PCP.  # DISPOSITION:check Magnesium.  #  follow up in 12 months-MD/labs- cbc/cmp/LDH-Dr.B

## 2020-08-06 NOTE — Addendum Note (Signed)
Addended by: Gloris Ham on: 08/06/2020 02:34 PM   Modules accepted: Orders

## 2020-08-06 NOTE — Progress Notes (Signed)
Patient c/o severe muscle cramping. She was recently started on blood pressure medications, hydrochlorothiazide and lisinopril. She is not yet taking metoprolol. She is also requesting Dr. B to perform a skin assessment to evaluate moles and skin changes. She reports intermittent numbness in the side of left side of her face. She also c/o lightheadness that occurs intermittently that after starting the bp medications.

## 2020-08-06 NOTE — Progress Notes (Signed)
[facility] OFFICE PROGRESS NOTE  Patient Care Team: Patient, No Pcp Per as PCP - General ([place]) Cammie Sickle, MD as Consulting Physician (Internal Medicine) Bary Castilla Forest Gleason, MD (General Surgery)  Cancer Staging No matching staging information was found for the patient.   Oncology History Overview Note  Chief Complaint/Diagnosis:   # 2013-  admitted with hypercalcemia and abnormal CT scan of the abdomen  2. 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 Patient was started on RCHOP chemotherapy June of 2013 4. High-dose methotrexate in alternate cycles starting from July 29. 5.last dose of all chemotherapy with R. CHOP (April 29, 2012) patient received total 6 cycles of chemotherapy and tolerated treatment very well without any significant side effect 6.and underwent high dose chemotherapy and stem cell support High-dose chemotherapy with BEAM regimen.  (March 24: 014  # SEP 13th 2017- ~1.2cm R Ingiunal LN; 83mm ex Iliac LN ? 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  female pleasant patient above history of diffuse large B-cell lymphoma status post autologous stem cell transplant in 2014 is here for follow-up.   appx 1 month ago- noted to have elevated BP- [170/90s x 1 month] s/p evaluation in urgent care  clinic. Started on anti-HTN medications.   Patient was also evaluated in the emergency room about 6 days ago because of again muscle cramping.  Muscle cramping for last many months.  Patient blood pressure was noted to be at 1 5100.  Patient was discharged on Valium and metoprolol.  Patient has not started metoprolol yet.  For many months- on and off Left side face numbness. No other neurologic symptoms.  No new lumps or bumps. no weight loss.   Review of Systems  Constitutional: Negative for chills, diaphoresis, fever, malaise/fatigue and weight loss.  HENT: Negative for nosebleeds and sore throat.   Eyes: Negative for double vision.  Respiratory: Negative for cough, hemoptysis, sputum production, shortness of breath and wheezing.   Cardiovascular: Negative for chest pain, palpitations, orthopnea and leg swelling.  Gastrointestinal: Negative for abdominal pain, blood in stool, constipation, diarrhea, heartburn, melena, nausea and vomiting.  Genitourinary: Negative for dysuria, frequency and urgency.  Musculoskeletal: Positive for back pain, joint pain and myalgias.  Skin: Negative for itching.  Neurological: Negative for dizziness, tingling, focal weakness, weakness and headaches.  Endo/Heme/Allergies: Does not bruise/bleed easily.  Psychiatric/Behavioral: Negative for depression. The patient is not nervous/anxious and does not have insomnia.      PAST MEDICAL HISTORY :  Past Medical History:  Diagnosis Date  . Anemia   . Anxiety   . Chronic kidney disease    kidney stones  . Chronic sinusitis   . 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 Fatigue   . GERD (gastroesophageal reflux disease)   . Hypertension    past  history of hypertension  . Hypoalbuminemia   . Personal history of chemotherapy June 2013 , and  March 2014  . Weight loss     PAST SURGICAL HISTORY :   Past Surgical History:  Procedure Laterality Date  . ABDOMINAL HYSTERECTOMY    . bladder tacking    .  INGUINAL LYMPH NODE BIOPSY Right 04/21/2016    INGUINAL LYMPH NODE BIOPSY;  REACTIVE LYMPHADENOPATHY.  Robert Bellow, MD;  [facility] ORS;General;  Laterality: Right;  . 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 PORTA CATH REMOVAL N/A 09/03/2017   Procedure: PORTA CATH REMOVAL;  Surgeon: Algernon Huxley, MD;  Location: [facility];  Service: Cardiovascular;  Laterality: N/A;  . PORTACATH PLACEMENT  2013  . RECTAL PROLAPSE REPAIR    . TUBAL LIGATION      FAMILY HISTORY :   Family History  Adopted: Yes  Family history unknown: Yes    SOCIAL HISTORY:   Social History   Tobacco Use  . Smoking status: Former Smoker    Packs/day: 1.00    Types: Cigarettes    Quit date: 12/16/1998    Years since quitting: 21.6  . Smokeless tobacco: Never Used  Substance Use Topics  . Alcohol use: Yes    Alcohol/week: 4.0 standard drinks    Types: 4 Glasses of wine per week  . Drug use: No    ALLERGIES:  is allergic to penicillins.  MEDICATIONS:  Current Outpatient Medications  Medication Sig Dispense Refill  . albuterol (PROVENTIL HFA;VENTOLIN HFA) 108 (90 Base) MCG/ACT inhaler Inhale 2 puffs into the lungs every 6 (six) hours as needed for wheezing or shortness of breath. 1 Inhaler 2  . diazepam (VALIUM) 2 MG tablet Take 0.5 tablets (1 mg total) by mouth every 8 (eight) hours as needed for anxiety. 10 tablet 0  . hydrochlorothiazide (HYDRODIURIL) 25 MG tablet Take 12.5 mg by mouth daily.    Marland Kitchen lisinopril (ZESTRIL) 10 MG tablet Take 10 mg by mouth daily.    . metoprolol tartrate (LOPRESSOR) 25 MG tablet Take 1 tablet (25 mg total) by mouth 2 (two) times daily. (Patient not taking: Reported on 08/06/2020) 60 tablet 2   No current facility-administered medications for this visit.   Facility-Administered Medications Ordered in Other Visits  Medication Dose Route Frequency Provider Last Rate Last Admin  . heparin lock flush 100 unit/mL  500 Units Intracatheter PRN Charlaine Dalton R, MD      .  sodium chloride flush (NS) 0.9 % injection 10 mL  10 mL Intravenous PRN Charlaine Dalton R, MD      . sodium chloride flush (NS) 0.9 % injection 10 mL  10 mL Intracatheter PRN Cammie Sickle, MD        PHYSICAL EXAMINATION: ECOG PERFORMANCE STATUS: 0 - Asymptomatic  BP 126/82 (BP Location: Left Arm, Patient Position: Sitting)   Pulse 78   Temp 98.8 F (37.1 C) (Tympanic)   Wt 210 lb (95.3 kg)   SpO2 100%   BMI 31.93 kg/m   Filed Weights   08/06/20 1332  Weight: 210 lb (95.3 kg)    Physical Exam Constitutional:      Comments: Alone.ambulating independently.   HENT:     Head: Normocephalic and atraumatic.     Mouth/Throat:     Pharynx: No oropharyngeal exudate.  Eyes:     Pupils: Pupils are equal, round, and reactive to light.  Cardiovascular:     Rate and Rhythm: Normal rate and regular  rhythm.  Pulmonary:     Effort: Pulmonary effort is normal. No respiratory distress.     Breath sounds: Normal breath sounds. No wheezing.  Abdominal:     General: Bowel sounds are normal. There is no distension.     Palpations: Abdomen is soft. There is no mass.     Tenderness: There is no abdominal tenderness. There is no guarding or rebound.  Musculoskeletal:        General: No tenderness. Normal range of motion.     Cervical back: Normal range of motion and neck supple.  Skin:    General: Skin is warm.  Neurological:     Mental Status: She is alert and oriented to person, place, and time.  Psychiatric:        Mood and Affect: Affect normal.     I have reviewed the data as listed    Component Value Date/Time   NA 135 08/06/2020 1316   NA 141 08/19/2014 1038   K 3.8 08/06/2020 1316   K 4.3 08/19/2014 1038   CL 97 (L) 08/06/2020 1316   CL 103 08/19/2014 1038   CO2 28 08/06/2020 1316   CO2 29 08/19/2014 1038   GLUCOSE 96 08/06/2020 1316   GLUCOSE 103 (H) 08/19/2014 1038   BUN 24 (H) 08/06/2020 1316   BUN 9 08/19/2014 1038   CREATININE 1.31 (H) 08/06/2020 1316    CREATININE 1.07 08/19/2014 1038   CALCIUM 9.3 08/06/2020 1316   CALCIUM 8.6 08/19/2014 1038   PROT 7.7 08/06/2020 1316   PROT 7.0 08/19/2014 1038   ALBUMIN 4.6 08/06/2020 1316   ALBUMIN 3.7 08/19/2014 1038   AST 37 08/06/2020 1316   AST 18 08/19/2014 1038   ALT 36 08/06/2020 1316   ALT 36 08/19/2014 1038   ALKPHOS 60 08/06/2020 1316   ALKPHOS 81 08/19/2014 1038   BILITOT 0.8 08/06/2020 1316   BILITOT 0.5 08/19/2014 1038   GFRNONAA 49 (L) 08/06/2020 1316   GFRNONAA 59 (L) 08/19/2014 1038   GFRNONAA >60 03/16/2014 0911   GFRAA >60 08/06/2019 1413   GFRAA >60 08/19/2014 1038   GFRAA >60 03/16/2014 0911    No results found for: SPEP, UPEP  Lab Results  Component Value Date   WBC 6.3 08/06/2020   NEUTROABS 4.1 08/06/2020   HGB 13.4 08/06/2020   HCT 38.7 08/06/2020   MCV 90.6 08/06/2020   PLT 251 08/06/2020      Chemistry      Component Value Date/Time   NA 135 08/06/2020 1316   NA 141 08/19/2014 1038   K 3.8 08/06/2020 1316   K 4.3 08/19/2014 1038   CL 97 (L) 08/06/2020 1316   CL 103 08/19/2014 1038   CO2 28 08/06/2020 1316   CO2 29 08/19/2014 1038   BUN 24 (H) 08/06/2020 1316   BUN 9 08/19/2014 1038   CREATININE 1.31 (H) 08/06/2020 1316   CREATININE 1.07 08/19/2014 1038      Component Value Date/Time   CALCIUM 9.3 08/06/2020 1316   CALCIUM 8.6 08/19/2014 1038   ALKPHOS 60 08/06/2020 1316   ALKPHOS 81 08/19/2014 1038   AST 37 08/06/2020 1316   AST 18 08/19/2014 1038   ALT 36 08/06/2020 1316   ALT 36 08/19/2014 1038   BILITOT 0.8 08/06/2020 1316   BILITOT 0.5 08/19/2014 1038       On: 02/02/2017 11:56  RADIOGRAPHIC STUDIES: I have personally reviewed the radiological images as listed and agreed with the findings in the report. No  results found.   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 STABLE. clinically no evidence of recurrence.  Continue surveillance without imaging.  # Myalgias- ? Vit D deficiency- recommend vit D 5000/units a day.  Add magnesium levels.  # HTN [recent diagnosis- dec 2021]- continue BP medications as prescribed.  Recommend patient follow-up with PCP; needs to set up appointment with PCP.  # DISPOSITION:check Magnesium.  #  follow up in 12 months-MD/labs- cbc/cmp/LDH-Dr.B   No orders of the defined types were placed in this encounter.  All questions were answered. The patient knows to call the clinic with any problems, questions or concerns.      Cammie Sickle, MD 08/06/2020 2:29 PM

## 2020-08-30 ENCOUNTER — Emergency Department
Admission: EM | Admit: 2020-08-30 | Discharge: 2020-08-30 | Disposition: A | Discharge status: Home / Self Care | Payer: 59 | Attending: Emergency Medicine | Admitting: Emergency Medicine

## 2020-08-30 ENCOUNTER — Other Ambulatory Visit: Payer: Self-pay

## 2020-08-30 ENCOUNTER — Emergency Department: Payer: 59

## 2020-08-30 ENCOUNTER — Encounter: Payer: Self-pay | Admitting: Emergency Medicine

## 2020-08-30 DIAGNOSIS — N189 Chronic kidney disease, unspecified: Secondary | ICD-10-CM | POA: Insufficient documentation

## 2020-08-30 DIAGNOSIS — Z79899 Other long term (current) drug therapy: Secondary | ICD-10-CM | POA: Insufficient documentation

## 2020-08-30 DIAGNOSIS — R109 Unspecified abdominal pain: Secondary | ICD-10-CM | POA: Diagnosis present

## 2020-08-30 DIAGNOSIS — K219 Gastro-esophageal reflux disease without esophagitis: Secondary | ICD-10-CM | POA: Insufficient documentation

## 2020-08-30 DIAGNOSIS — N23 Unspecified renal colic: Secondary | ICD-10-CM | POA: Diagnosis not present

## 2020-08-30 DIAGNOSIS — I129 Hypertensive chronic kidney disease with stage 1 through stage 4 chronic kidney disease, or unspecified chronic kidney disease: Secondary | ICD-10-CM | POA: Diagnosis not present

## 2020-08-30 DIAGNOSIS — Z87891 Personal history of nicotine dependence: Secondary | ICD-10-CM | POA: Insufficient documentation

## 2020-08-30 LAB — URINALYSIS, COMPLETE (UACMP) WITH MICROSCOPIC
Bacteria, UA: NONE SEEN
Bilirubin Urine: NEGATIVE
Glucose, UA: NEGATIVE mg/dL
Ketones, ur: NEGATIVE mg/dL
Leukocytes,Ua: NEGATIVE
Nitrite: NEGATIVE
Protein, ur: 30 mg/dL — AB
RBC / HPF: >50 RBC/hpf — ABNORMAL HIGH (ref 0–5)
Specific Gravity, Urine: 1.023 (ref 1.005–1.030)
pH: 5 (ref 5.0–8.0)

## 2020-08-30 LAB — CBC WITH DIFFERENTIAL/PLATELET
Abs Immature Granulocytes: 0.04 10*3/uL (ref 0.00–0.07)
Basophils Absolute: 0 10*3/uL (ref 0.0–0.1)
Basophils Relative: 0 %
Eosinophils Absolute: 0 10*3/uL (ref 0.0–0.5)
Eosinophils Relative: 0 %
HCT: 40.8 % (ref 36.0–46.0)
Hemoglobin: 13.7 g/dL (ref 12.0–15.0)
Immature Granulocytes: 0 %
Lymphocytes Relative: 10 %
Lymphs Abs: 1 10*3/uL (ref 0.7–4.0)
MCH: 31.1 pg (ref 26.0–34.0)
MCHC: 33.6 g/dL (ref 30.0–36.0)
MCV: 92.5 fL (ref 80.0–100.0)
Monocytes Absolute: 0.3 10*3/uL (ref 0.1–1.0)
Monocytes Relative: 3 %
Neutro Abs: 9.3 10*3/uL — ABNORMAL HIGH (ref 1.7–7.7)
Neutrophils Relative %: 87 %
Platelets: 220 10*3/uL (ref 150–400)
RBC: 4.41 MIL/uL (ref 3.87–5.11)
RDW: 12.2 % (ref 11.5–15.5)
WBC: 10.7 10*3/uL — ABNORMAL HIGH (ref 4.0–10.5)
nRBC: 0 % (ref 0.0–0.2)

## 2020-08-30 LAB — COMPREHENSIVE METABOLIC PANEL
ALT: 25 U/L (ref 0–44)
AST: 21 U/L (ref 15–41)
Albumin: 4.5 g/dL (ref 3.5–5.0)
Alkaline Phosphatase: 65 U/L (ref 38–126)
Anion gap: 13 (ref 5–15)
BUN: 16 mg/dL (ref 6–20)
CO2: 27 mmol/L (ref 22–32)
Calcium: 9.4 mg/dL (ref 8.9–10.3)
Chloride: 99 mmol/L (ref 98–111)
Creatinine, Ser: 1.17 mg/dL — ABNORMAL HIGH (ref 0.44–1.00)
GFR, Estimated: 56 mL/min — ABNORMAL LOW (ref >60)
Glucose, Bld: 153 mg/dL — ABNORMAL HIGH (ref 70–99)
Potassium: 3.7 mmol/L (ref 3.5–5.1)
Sodium: 139 mmol/L (ref 135–145)
Total Bilirubin: 0.5 mg/dL (ref 0.3–1.2)
Total Protein: 7.7 g/dL (ref 6.5–8.1)

## 2020-08-30 MED ORDER — ONDANSETRON 4 MG PO TBDP: 4.0000 mg | ORAL_TABLET | Freq: Three times a day (TID) | ORAL | 0 refills | Status: DC | PRN

## 2020-08-30 MED ORDER — PHENAZOPYRIDINE HCL 200 MG PO TABS
200.0000 mg | ORAL_TABLET | Freq: Once | ORAL | Status: AC
  Administered 2020-08-30: 200 mg via ORAL
  Filled 2020-08-30: qty 1

## 2020-08-30 MED ORDER — OXYCODONE-ACETAMINOPHEN 5-325 MG PO TABS
1.0000 | ORAL_TABLET | Freq: Once | ORAL | Status: AC
  Administered 2020-08-30: 1 via ORAL
  Filled 2020-08-30: qty 1

## 2020-08-30 MED ORDER — KETOROLAC TROMETHAMINE 60 MG/2ML IM SOLN
30.0000 mg | Freq: Once | INTRAMUSCULAR | Status: AC
  Administered 2020-08-30: 30 mg via INTRAMUSCULAR
  Filled 2020-08-30: qty 2

## 2020-08-30 MED ORDER — PHENAZOPYRIDINE HCL 200 MG PO TABS: 200.0000 mg | ORAL_TABLET | Freq: Three times a day (TID) | ORAL | 0 refills | Status: DC | PRN

## 2020-08-30 MED ORDER — OXYCODONE-ACETAMINOPHEN 5-325 MG PO TABS: 1.0000 | ORAL_TABLET | ORAL | 0 refills | Status: DC | PRN

## 2020-08-30 NOTE — ED Notes (Signed)
Pt declined zofran

## 2020-08-30 NOTE — Discharge Instructions (Signed)
1. Take pain & nausea medicines as needed (Percocet/Zofran #15). Make sure to take a stool softener while taking narcotic pain medicines. 2. Take Pyridium as needed for urinary discomfort/irritation. 3. Drink plenty of bottled or filtered water daily. 4. Return to the ER for worsening symptoms, persistent vomiting, fever, difficulty breathing or other concerns.

## 2020-08-30 NOTE — ED Triage Notes (Signed)
Pt c/o left flank pain reports hx of kidney stones and "this feels like it", pt reports nausea and 1 x vomit  Pt reports taking a Vicodin 2 hours prior without relief

## 2020-08-30 NOTE — ED Notes (Signed)
No peripheral IV placed this visit.   Discharge instructions reviewed with patient. Questions fielded by this RN. Patient verbalizes understanding of instructions. Patient discharged home in stable condition per Dr Beather Arbour. No acute distress noted at time of discharge. ' \ Pt wheeled to daughter's car in front of lobby

## 2020-08-30 NOTE — ED Provider Notes (Signed)
[facility] Emergency Department Provider Note   ____________________________________________   Event Date/Time   First MD Initiated Contact with Patient 08/30/20 [phone]     (approximate)  I have reviewed the triage vital signs and the nursing notes.   HISTORY  Chief Complaint Flank Pain    HPI Bethany Kelly is a 50 y.o. female who presents to the ED from home with a chief complaint of left flank pain. Patient has a history of kidney stones and states this feels similarly. Acute onset of pain tonight making her unable to sleep and associated with one episode of emesis. Patient took a Vicodin 2 hours prior to arrival without relief of symptoms. Denies fever, chest pain, shortness of breath, abdominal pain, hematuria or diarrhea.     Past Medical History:  Diagnosis Date  . Anemia   . Anxiety   . Chronic kidney disease    kidney stones  . Chronic sinusitis   . 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 Fatigue   . GERD (gastroesophageal reflux disease)   . Hypertension    past history of hypertension  . Hypoalbuminemia   . Personal history of chemotherapy June 2013 , and  March 2014  . Weight loss     Patient Active Problem List   Diagnosis Date Noted  . Closed fracture of lateral malleolus 02/14/2017  . Back pain, subacute 02/06/2017  . 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 Chronic sinusitis   . Patellofemoral stress syndrome 04/13/2014  . Lateral epicondylitis of elbow 04/02/2014  . GERD (gastroesophageal reflux disease) 10/29/2012  . Hypertension 10/28/2012  . DLBCL (diffuse large B cell lymphoma) ([place]) 10/28/2012  . 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 ABDOMINAL HYSTERECTOMY    . bladder tacking    . INGUINAL LYMPH NODE BIOPSY Right 04/21/2016    INGUINAL LYMPH NODE BIOPSY;  REACTIVE  LYMPHADENOPATHY.  Robert Bellow, MD;  [facility] ORS;General;  Laterality: Right;  . 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 PORTA CATH REMOVAL N/A 09/03/2017   Procedure: PORTA CATH REMOVAL;  Surgeon: Algernon Huxley, MD;  Location: [facility];  Service: Cardiovascular;  Laterality: N/A;  . PORTACATH PLACEMENT  2013  . RECTAL PROLAPSE REPAIR    . TUBAL LIGATION      Prior to Admission medications   Medication Sig Start Date End Date Taking? Authorizing Provider  albuterol (PROVENTIL HFA;VENTOLIN HFA) 108 (90 Base) MCG/ACT inhaler Inhale 2 puffs into the lungs every 6 (six) hours as needed for wheezing or shortness of breath. 10/09/18   Cammie Sickle, MD  diazepam (VALIUM) 2 MG tablet Take 0.5 tablets (1 mg total) by mouth every 8 (eight) hours as needed for anxiety. 07/31/20   Menshew, Dannielle Karvonen, PA-C  hydrochlorothiazide (HYDRODIURIL) 25 MG tablet Take 12.5 mg by mouth daily.    [provider]  lisinopril (ZESTRIL) 10 MG tablet Take 10 mg by mouth daily.    [provider]  metoprolol tartrate (LOPRESSOR) 25 MG tablet Take 1 tablet (25 mg total) by mouth 2 (two) times daily. Patient not taking: Reported on 08/06/2020 07/31/20 10/29/20  Menshew, Dannielle Karvonen, PA-C    Allergies Penicillins  Family History  Adopted: Yes  Family history unknown: Yes    Social History Social History   Tobacco Use  .  Smoking status: Former Smoker    Packs/day: 1.00    Types: Cigarettes    Quit date: 12/16/1998    Years since quitting: 21.7  . Smokeless tobacco: Never Used  Substance Use Topics  . Alcohol use: Yes    Alcohol/week: 4.0 standard drinks    Types: 4 Glasses of wine per week  . Drug use: No    Review of Systems  Constitutional: No fever/chills Eyes: No visual changes. ENT: No sore throat. Cardiovascular: Denies chest pain. Respiratory: Denies shortness of breath. Gastrointestinal: Positive for left flank pain. No abdominal pain. Positive for  vomiting.  No diarrhea.  No constipation. Genitourinary: Negative for dysuria. Musculoskeletal: Negative for back pain. Skin: Negative for rash. Neurological: Negative for headaches, focal weakness or numbness.   ____________________________________________   PHYSICAL EXAM:  VITAL SIGNS: ED Triage Vitals  Enc Vitals Group     BP 08/30/20 0027 (!) 147/91     Pulse Rate 08/30/20 0027 81     Resp 08/30/20 0027 (!) 22     Temp 08/30/20 0027 98.2 F (36.8 C)     Temp Source 08/30/20 0027 Oral     SpO2 08/30/20 0027 98 %     Weight 08/30/20 0028 215 lb (97.5 kg)     Height 08/30/20 0028 5\' 8"  (1.727 m)     Head Circumference --      Peak Flow --      Pain Score 08/30/20 0027 10     Pain Loc --      Pain Edu? --      Excl. in [place]? --     Constitutional: Alert and oriented. Well appearing and in no acute distress. Eyes: Conjunctivae are normal. PERRL. EOMI. Head: Atraumatic. Nose: No congestion/rhinnorhea. Mouth/Throat: Mucous membranes are moist.  Oropharynx non-erythematous. Neck: No stridor.   Cardiovascular: Normal rate, regular rhythm. Grossly normal heart sounds.  Good peripheral circulation. Respiratory: Normal respiratory effort.  No retractions. Lungs CTAB. Gastrointestinal: Soft and nontender to light or deep palpation. No distention. No abdominal bruits. No CVA tenderness. Musculoskeletal: No lower extremity tenderness nor edema.  No joint effusions. Neurologic:  Normal speech and language. No gross focal neurologic deficits are appreciated. No gait instability. Skin:  Skin is warm, dry and intact. No rash noted. No vesicles. Psychiatric: Mood and affect are normal. Speech and behavior are normal.  ____________________________________________   LABS (all labs ordered are listed, but only abnormal results are displayed)  Labs Reviewed  URINALYSIS, COMPLETE (UACMP) WITH MICROSCOPIC - Abnormal; Notable for the following components:      Result Value   Color, Urine  YELLOW (*)    APPearance CLOUDY (*)    Hgb urine dipstick LARGE (*)    Protein, ur 30 (*)    RBC / HPF >50 (*)    All other components within normal limits  CBC WITH DIFFERENTIAL/PLATELET - Abnormal; Notable for the following components:   WBC 10.7 (*)    Neutro Abs 9.3 (*)    All other components within normal limits  COMPREHENSIVE METABOLIC PANEL - Abnormal; Notable for the following components:   Glucose, Bld 153 (*)    Creatinine, Ser 1.17 (*)    GFR, Estimated 56 (*)    All other components within normal limits   ____________________________________________  EKG  None ____________________________________________  RADIOLOGY I, [place] Grayer J, personally viewed and evaluated these images (plain radiographs) as part of my medical decision making, as well as reviewing the written report by the radiologist.  ED  MD interpretation: Recently passed 5 mm stone  Official radiology report(s): CT Renal Stone Study  Result Date: 08/30/2020 CLINICAL DATA:  Left-sided flank pain for 1 week EXAM: CT ABDOMEN AND PELVIS WITHOUT CONTRAST TECHNIQUE: Multidetector CT imaging of the abdomen and pelvis was performed following the standard protocol without IV contrast. COMPARISON:  February 02, 2017 FINDINGS: Lower chest: The visualized heart size within normal limits. No pericardial fluid/thickening. No hiatal hernia. The visualized portions of the lungs are clear. Hepatobiliary: There is diffuse low density seen throughout the liver parenchyma. No focal hepatic lesion is seen. No intra or extrahepatic biliary ductal dilatation. No evidence of calcified gallstones or biliary ductal dilatation. Pancreas:  Unremarkable.  No surrounding inflammatory changes. Spleen: Normal in size. Although limited due to the lack of intravenous contrast, normal in appearance. Adrenals/Urinary Tract: Both adrenal glands appear normal. There is mild left-sided pelvicaliectasis with perinephric and periureteral stranding down to the  level of the UVJ. Just entering the posterior left bladder there is a 5 mm calculus present. Within the lower pole of the right kidney there is a 4 mm calculus present. Stomach/Bowel: The stomach, small bowel, and colon are normal in appearance. No inflammatory changes or obstructive findings. Scattered colonic diverticula are noted. Appendix is normal. Vascular/Lymphatic: There are no enlarged abdominal or pelvic lymph nodes. Scattered aortic atherosclerotic calcifications are seen without aneurysmal dilatation. Reproductive: The prostate is unremarkable. Other: No evidence of abdominal wall mass or hernia. Musculoskeletal: No acute or significant osseous findings. Degenerative changes are most notable at L5-S1. IMPRESSION: Mild left hydronephrosis to the UVJ which is likely due to recently passed stone. There is a 5 mm calculus present in the posterior left bladder. Nonobstructing right renal calculus Aortic Atherosclerosis (ICD10-I70.0). Electronically Signed   By: Prudencio Pair M.D.   On: 08/30/2020 00:57    ____________________________________________   PROCEDURES  Procedure(s) performed (including Critical Care):  Procedures   ____________________________________________   INITIAL IMPRESSION / ASSESSMENT AND PLAN / ED COURSE  As part of my medical decision making, I reviewed the following data within the [place] notes reviewed and incorporated, Labs reviewed, Old chart reviewed, Radiograph reviewed, Notes from prior ED visits and [place] Controlled Substance Database     50 year old female with a past history of kidney stones presenting with left flank pain. Differential diagnosis includes, but is not limited to, ovarian cyst, ovarian torsion, acute appendicitis, diverticulitis, urinary tract infection/pyelonephritis, endometriosis, bowel obstruction, colitis, renal colic, gastroenteritis, hernia, fibroids, endometriosis, etc.  Laboratory and imaging results  demonstrate recently passed stone. Patient states pain resolved while out in the lobby. Just has residual discomfort when she urinates. Will discharge home with prescription for as needed Percocet/Zofran and Pyridium. She will follow up with urology as needed. Strict return precautions given. Patient verbalizes understanding agrees with plan of care.      ____________________________________________   FINAL CLINICAL IMPRESSION(S) / ED DIAGNOSES  Final diagnoses:  Left flank pain  Ureteral colic     ED Discharge Orders    None      *Please note:  Bethany Kelly was evaluated in Emergency Department on 08/30/2020 for the symptoms described in the history of present illness. She was evaluated in the context of the global COVID-19 pandemic, which necessitated consideration that the patient might be at risk for infection with the SARS-CoV-2 virus that causes COVID-19. Institutional protocols and algorithms that pertain to the evaluation of patients at risk for COVID-19 are in a state of rapid  change based on information released by regulatory bodies including the CDC and federal and state organizations. These policies and algorithms were followed during the patient's care in the ED.  Some ED evaluations and interventions may be delayed as a result of limited staffing during and the pandemic.*   Note:  This document was prepared using Dragon voice recognition software and may include unintentional dictation errors.   Paulette Blanch, MD 08/30/20 [phone]

## 2020-11-03 ENCOUNTER — Other Ambulatory Visit: Payer: Self-pay | Admitting: Family Medicine

## 2020-11-03 DIAGNOSIS — Z1231 Encounter for screening mammogram for malignant neoplasm of breast: Secondary | ICD-10-CM

## 2021-07-20 ENCOUNTER — Other Ambulatory Visit: Payer: Self-pay | Admitting: Family Medicine

## 2021-07-20 DIAGNOSIS — Z1231 Encounter for screening mammogram for malignant neoplasm of breast: Secondary | ICD-10-CM

## 2021-08-05 NOTE — Telephone Encounter (Signed)
error 

## 2021-08-08 ENCOUNTER — Inpatient Hospital Stay: Payer: 59 | Attending: Internal Medicine

## 2021-08-08 ENCOUNTER — Inpatient Hospital Stay (HOSPITAL_BASED_OUTPATIENT_CLINIC_OR_DEPARTMENT_OTHER): Payer: 59 | Admitting: Internal Medicine

## 2021-08-08 ENCOUNTER — Encounter: Payer: Self-pay | Admitting: Internal Medicine

## 2021-08-08 ENCOUNTER — Other Ambulatory Visit: Payer: Self-pay

## 2021-08-08 VITALS — BP 145/87 | HR 101 | Temp 100.9°F | Resp 18 | Wt 209.8 lb

## 2021-08-08 DIAGNOSIS — Z90710 Acquired absence of both cervix and uterus: Secondary | ICD-10-CM | POA: Insufficient documentation

## 2021-08-08 DIAGNOSIS — C8337 Diffuse large B-cell lymphoma, spleen: Secondary | ICD-10-CM | POA: Diagnosis not present

## 2021-08-08 DIAGNOSIS — Z79899 Other long term (current) drug therapy: Secondary | ICD-10-CM | POA: Insufficient documentation

## 2021-08-08 DIAGNOSIS — Z8572 Personal history of non-Hodgkin lymphomas: Secondary | ICD-10-CM | POA: Insufficient documentation

## 2021-08-08 DIAGNOSIS — Z87891 Personal history of nicotine dependence: Secondary | ICD-10-CM | POA: Diagnosis not present

## 2021-08-08 DIAGNOSIS — R1084 Generalized abdominal pain: Secondary | ICD-10-CM | POA: Insufficient documentation

## 2021-08-08 DIAGNOSIS — I10 Essential (primary) hypertension: Secondary | ICD-10-CM | POA: Diagnosis not present

## 2021-08-08 DIAGNOSIS — Z9484 Stem cells transplant status: Secondary | ICD-10-CM | POA: Insufficient documentation

## 2021-08-08 DIAGNOSIS — Z9221 Personal history of antineoplastic chemotherapy: Secondary | ICD-10-CM | POA: Insufficient documentation

## 2021-08-08 LAB — CBC WITH DIFFERENTIAL/PLATELET
Abs Immature Granulocytes: 0.06 10*3/uL (ref 0.00–0.07)
Basophils Absolute: 0.1 10*3/uL (ref 0.0–0.1)
Basophils Relative: 0 %
Eosinophils Absolute: 0.1 10*3/uL (ref 0.0–0.5)
Eosinophils Relative: 1 %
HCT: 40.7 % (ref 36.0–46.0)
Hemoglobin: 13.9 g/dL (ref 12.0–15.0)
Immature Granulocytes: 1 %
Lymphocytes Relative: 14 %
Lymphs Abs: 1.8 10*3/uL (ref 0.7–4.0)
MCH: 32.9 pg (ref 26.0–34.0)
MCHC: 34.2 g/dL (ref 30.0–36.0)
MCV: 96.2 fL (ref 80.0–100.0)
Monocytes Absolute: 0.5 10*3/uL (ref 0.1–1.0)
Monocytes Relative: 4 %
Neutro Abs: 10.6 10*3/uL — ABNORMAL HIGH (ref 1.7–7.7)
Neutrophils Relative %: 80 %
Platelets: 177 10*3/uL (ref 150–400)
RBC: 4.23 MIL/uL (ref 3.87–5.11)
RDW: 12.1 % (ref 11.5–15.5)
WBC: 13.1 10*3/uL — ABNORMAL HIGH (ref 4.0–10.5)
nRBC: 0 % (ref 0.0–0.2)

## 2021-08-08 LAB — COMPREHENSIVE METABOLIC PANEL
ALT: 27 U/L (ref 0–44)
AST: 23 U/L (ref 15–41)
Albumin: 4.1 g/dL (ref 3.5–5.0)
Alkaline Phosphatase: 58 U/L (ref 38–126)
Anion gap: 10 (ref 5–15)
BUN: 11 mg/dL (ref 6–20)
CO2: 28 mmol/L (ref 22–32)
Calcium: 8.8 mg/dL — ABNORMAL LOW (ref 8.9–10.3)
Chloride: 97 mmol/L — ABNORMAL LOW (ref 98–111)
Creatinine, Ser: 0.91 mg/dL (ref 0.44–1.00)
GFR, Estimated: >60 mL/min (ref >60)
Glucose, Bld: 151 mg/dL — ABNORMAL HIGH (ref 70–99)
Potassium: 3.6 mmol/L (ref 3.5–5.1)
Sodium: 135 mmol/L (ref 135–145)
Total Bilirubin: 1.4 mg/dL — ABNORMAL HIGH (ref 0.3–1.2)
Total Protein: 7.4 g/dL (ref 6.5–8.1)

## 2021-08-08 LAB — LACTATE DEHYDROGENASE: LDH: 145 U/L (ref 98–192)

## 2021-08-08 NOTE — Assessment & Plan Note (Addendum)
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 STABLE. clinically no evidence of recurrence-however see below with regards to worsening abdominal pain.  # Abdominal discomfort 2-3 months [BM-30-40 mins meals]; No Nausea/vomitting.  Especially given history of lymphoma.  Recommend CT AP ASAP.   # HTN [recent diagnosis- dec 2021]- continue BP medications as prescribed.  Recommend keep checking blood pressures daily at home/bring the log to PCP  # DISPOSITION:willl call-  # CT A/P ASAP.  #  follow up in 12 months-MD/labs- cbc/cmp/LDH-Dr.B

## 2021-08-08 NOTE — Progress Notes (Signed)
[facility] OFFICE PROGRESS NOTE  Patient Care Team: Patient, No Pcp Per (Inactive) as PCP - General ([place]) Cammie Sickle, MD as Consulting Physician (Internal Medicine) Bary Castilla Forest Gleason, MD (General Surgery)   Cancer Staging  No matching staging information was found for the patient.   Oncology History Overview Note  Chief Complaint/Diagnosis:   # 2013-  admitted with hypercalcemia and abnormal CT scan of the abdomen  2. 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 Patient was started on RCHOP chemotherapy June of 2013 4. High-dose methotrexate in alternate cycles starting from July 29. 5.last dose of all chemotherapy with R. CHOP (April 29, 2012) patient received total 6 cycles of chemotherapy and tolerated treatment very well without any significant side effect 6.and underwent high dose chemotherapy and stem cell support High-dose chemotherapy with BEAM regimen.  (March 24: 014  # SEP 13th 2017- ~1.2cm R Ingiunal LN; 19mm ex Iliac LN ? Recurrence; RIGHT INGUINAL LN excisional Bx- NEGATIVE [Dr.Byrnett]; JAN 2018- CT NED; sub cm LN  # Port explantation [feb 2019] -------------------   DIAGNOSIS:DLBCL  STAGE:   IV      ;GOALS: cure  CURRENT/MOST RECENT THERAPY: surveillaince    DLBCL (diffuse large B cell lymphoma) ([place])  10/28/2012 Initial Diagnosis   Malignant lymphoma, large cell, diffuse (HCC)   Diffuse large B-cell lymphoma of spleen ([place])  09/27/2015 Initial Diagnosis   Diffuse large B-cell lymphoma of spleen (H[facility])      INTERVAL HISTORY: Alone.  Ambulating independently.  Bethany Kelly 51 y.o.  female pleasant patient above history of diffuse large B-cell lymphoma status post autologous stem cell transplant in 2014 is here for follow-up.   Patient complains of abdominal  discomfort-generalized worsening over the last 2 to 3 months.  Especially worse after a meal; improved with bowel movement.  Mild nausea.  No vomiting.  Continues to state that she has elevated blood pressure systolic 852D/[id number] 14E at home.  No new lumps or bumps. no weight loss.   Review of Systems  Constitutional:  Negative for chills, diaphoresis, fever, malaise/fatigue and weight loss.  HENT:  Negative for nosebleeds and sore throat.   Eyes:  Negative for double vision.  Respiratory:  Negative for cough, hemoptysis, sputum production, shortness of breath and wheezing.   Cardiovascular:  Negative for chest pain, palpitations, orthopnea and leg swelling.  Gastrointestinal:  Positive for abdominal pain and nausea. Negative for blood in stool, constipation, diarrhea, heartburn, melena and vomiting.  Genitourinary:  Negative for dysuria, frequency and urgency.  Musculoskeletal:  Positive for back pain, joint pain and myalgias.  Skin:  Negative for itching.  Neurological:  Negative for dizziness, tingling, focal weakness, weakness and headaches.  Endo/Heme/Allergies:  Does not bruise/bleed easily.  Psychiatric/Behavioral:  Negative for depression. The patient is not nervous/anxious and does not have insomnia.     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  Robert Bellow, MD;  [facility] ORS;General;   Laterality: Right;   LIMBAL STEM CELL TRANSPLANT  12/2012   PORTA CATH REMOVAL N/A 09/03/2017   Procedure: PORTA CATH REMOVAL;  Surgeon: Algernon Huxley, MD;  Location: [facility];  Service: Cardiovascular;  Laterality: N/A;   PORTACATH PLACEMENT  2013   RECTAL PROLAPSE REPAIR     TUBAL LIGATION      FAMILY HISTORY :   Family History  Adopted: Yes  Family history unknown: Yes    SOCIAL HISTORY:   Social History   Tobacco Use   Smoking status: Former    Packs/day: 1.00    Types: Cigarettes    Quit date: 12/16/1998    Years since quitting: 22.6   Smokeless tobacco: Never  Substance Use Topics   Alcohol use: Yes    Alcohol/week: 4.0 standard drinks    Types: 4 Glasses of wine per week   Drug use: No    ALLERGIES:  is allergic to penicillins.  MEDICATIONS:  Current Outpatient Medications  Medication Sig Dispense Refill   albuterol (PROVENTIL HFA;VENTOLIN HFA) 108 (90 Base) MCG/ACT inhaler Inhale 2 puffs into the lungs every 6 (six) hours as needed for wheezing or shortness of breath. 1 Inhaler 2   APPLE CIDER VINEGAR PO Take by mouth. capsules     diazepam (VALIUM) 2 MG tablet Take 0.5 tablets (1 mg total) by mouth every 8 (eight) hours as needed for anxiety. 10 tablet 0   hydrochlorothiazide (HYDRODIURIL) 25 MG tablet Take 12.5 mg by mouth daily.     lisinopril (ZESTRIL) 10 MG tablet Take 10 mg by mouth daily.     MULTIPLE VITAMIN PO Take by mouth.     ondansetron (ZOFRAN ODT) 4 MG disintegrating tablet Take 1 tablet (4 mg total) by mouth every 8 (eight) hours as needed for nausea or vomiting. 15 tablet 0   oxyCODONE-acetaminophen (PERCOCET/ROXICET) 5-325 MG tablet Take 1 tablet by mouth every 4 (four) hours as needed for severe pain. 15 tablet 0   TURMERIC PO Take by mouth.     metoprolol tartrate (LOPRESSOR) 25 MG tablet Take 1 tablet (25 mg total) by mouth 2 (two) times daily. (Patient not taking: Reported on 08/06/2020) 60 tablet 2   phenazopyridine (PYRIDIUM) 200  MG tablet Take 1 tablet (200 mg total) by mouth 3 (three) times daily as needed for pain. (Patient not taking: Reported on 08/08/2021) 6 tablet 0   No current facility-administered medications for this visit.   Facility-Administered Medications Ordered in Other Visits  Medication Dose Route Frequency Provider Last Rate Last Admin   heparin lock flush 100 unit/mL  500 Units Intracatheter PRN Cammie Sickle, MD       sodium chloride flush (NS) 0.9 % injection 10 mL  10 mL Intravenous PRN Cammie Sickle, MD       sodium chloride flush (NS) 0.9 % injection 10 mL  10 mL Intracatheter PRN Cammie Sickle, MD        PHYSICAL EXAMINATION: ECOG PERFORMANCE STATUS: 0 - Asymptomatic  BP (!) 145/87    Pulse (!) 101    Temp (!) 100.9 F (38.3 C)    Resp 18    Wt 209 lb 12.8  oz (95.2 kg)    BMI 31.90 kg/m   Filed Weights   08/08/21 1341  Weight: 209 lb 12.8 oz (95.2 kg)    Physical Exam Constitutional:      Comments: Alone.ambulating independently.   HENT:     Head: Normocephalic and atraumatic.     Mouth/Throat:     Pharynx: No oropharyngeal exudate.  Eyes:     Pupils: Pupils are equal, round, and reactive to light.  Cardiovascular:     Rate and Rhythm: Normal rate and regular rhythm.  Pulmonary:     Effort: Pulmonary effort is normal. No respiratory distress.     Breath sounds: Normal breath sounds. No wheezing.  Abdominal:     General: Bowel sounds are normal. There is no distension.     Palpations: Abdomen is soft. There is no mass.     Tenderness: There is no abdominal tenderness. There is no guarding or rebound.  Musculoskeletal:        General: No tenderness. Normal range of motion.     Cervical back: Normal range of motion and neck supple.  Skin:    General: Skin is warm.  Neurological:     Mental Status: She is alert and oriented to person, place, and time.  Psychiatric:        Mood and Affect: Affect normal.    I have reviewed the data as listed     Component Value Date/Time   NA 135 08/08/2021 1324   NA 141 08/19/2014 1038   K 3.6 08/08/2021 1324   K 4.3 08/19/2014 1038   CL 97 (L) 08/08/2021 1324   CL 103 08/19/2014 1038   CO2 28 08/08/2021 1324   CO2 29 08/19/2014 1038   GLUCOSE 151 (H) 08/08/2021 1324   GLUCOSE 103 (H) 08/19/2014 1038   BUN 11 08/08/2021 1324   BUN 9 08/19/2014 1038   CREATININE 0.91 08/08/2021 1324   CREATININE 1.07 08/19/2014 1038   CALCIUM 8.8 (L) 08/08/2021 1324   CALCIUM 8.6 08/19/2014 1038   PROT 7.4 08/08/2021 1324   PROT 7.0 08/19/2014 1038   ALBUMIN 4.1 08/08/2021 1324   ALBUMIN 3.7 08/19/2014 1038   AST 23 08/08/2021 1324   AST 18 08/19/2014 1038   ALT 27 08/08/2021 1324   ALT 36 08/19/2014 1038   ALKPHOS 58 08/08/2021 1324   ALKPHOS 81 08/19/2014 1038   BILITOT 1.4 (H) 08/08/2021 1324   BILITOT 0.5 08/19/2014 1038   GFRNONAA >60 08/08/2021 1324   GFRNONAA 59 (L) 08/19/2014 1038   GFRNONAA >60 03/16/2014 0911   GFRAA >60 08/06/2019 1413   GFRAA >60 08/19/2014 1038   GFRAA >60 03/16/2014 0911    No results found for: SPEP, UPEP  Lab Results  Component Value Date   WBC 13.1 (H) 08/08/2021   NEUTROABS 10.6 (H) 08/08/2021   HGB 13.9 08/08/2021   HCT 40.7 08/08/2021   MCV 96.2 08/08/2021   PLT 177 08/08/2021      Chemistry      Component Value Date/Time   NA 135 08/08/2021 1324   NA 141 08/19/2014 1038   K 3.6 08/08/2021 1324   K 4.3 08/19/2014 1038   CL 97 (L) 08/08/2021 1324   CL 103 08/19/2014 1038   CO2 28 08/08/2021 1324   CO2 29 08/19/2014 1038   BUN 11 08/08/2021 1324   BUN 9 08/19/2014 1038   CREATININE 0.91 08/08/2021 1324   CREATININE 1.07 08/19/2014 1038      Component Value Date/Time  CALCIUM 8.8 (L) 08/08/2021 1324   CALCIUM 8.6 08/19/2014 1038   ALKPHOS 58 08/08/2021 1324   ALKPHOS 81 08/19/2014 1038   AST 23 08/08/2021 1324   AST 18 08/19/2014 1038   ALT 27 08/08/2021 1324   ALT 36 08/19/2014 1038   BILITOT 1.4 (H) 08/08/2021 1324   BILITOT 0.5  08/19/2014 1038       On: 02/02/2017 11:56  RADIOGRAPHIC STUDIES: I have personally reviewed the radiological images as listed and agreed with the findings in the report. No results found.   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 STABLE. clinically no evidence of recurrence-however see below with regards to worsening abdominal pain.  # Abdominal discomfort 2-3 months [BM-30-40 mins meals]; No Nausea/vomitting.  Especially given history of lymphoma.  Recommend CT AP ASAP.   # HTN [recent diagnosis- dec 2021]- continue BP medications as prescribed.  Recommend keep checking blood pressures daily at home/bring the log to PCP  # DISPOSITION:willl call-  # CT A/P ASAP.  #  follow up in 12 months-MD/labs- cbc/cmp/LDH-Dr.B   Orders Placed This Encounter  Procedures   CT ABDOMEN PELVIS W CONTRAST    Standing Status:   Future    Standing Expiration Date:   08/08/2022    Order Specific Question:   If indicated for the ordered procedure, I authorize the administration of contrast media per Radiology protocol    Answer:   Yes    Order Specific Question:   Preferred imaging location?    Answer:   [facility]    Order Specific Question:   Radiology Contrast Protocol - do NOT remove file path    Answer:   [email or web address]    Order Specific Question:   Is patient pregnant?    Answer:   No   CBC with Differential/Platelet    Standing Status:   Future    Standing Expiration Date:   08/08/2022   Comprehensive metabolic panel    Standing Status:   Future    Standing Expiration Date:   08/08/2022   Lactate dehydrogenase    Standing Status:   Future    Standing Expiration Date:   08/08/2022   All questions were answered. The patient knows to call the clinic with any problems,  questions or concerns.      Cammie Sickle, MD 08/08/2021 2:11 PM

## 2021-08-08 NOTE — Progress Notes (Signed)
Patient has constant "pressure" in upper abdomen for months and diarrhea after eating.  Primary care has referred her to GI but she is concerned and is requesting PET scan be scheduled.  Blood pressure reading in office today is 145/87, HR 101, temp 100.9.

## 2021-08-09 ENCOUNTER — Encounter: Payer: Self-pay | Admitting: Internal Medicine

## 2021-08-09 ENCOUNTER — Ambulatory Visit
Admission: RE | Admit: 2021-08-09 | Discharge: 2021-08-09 | Disposition: A | Discharge status: Home / Self Care | Payer: 59 | Source: Ambulatory Visit | Attending: Internal Medicine | Admitting: Internal Medicine

## 2021-08-09 DIAGNOSIS — R1084 Generalized abdominal pain: Secondary | ICD-10-CM | POA: Insufficient documentation

## 2021-08-09 DIAGNOSIS — C8337 Diffuse large B-cell lymphoma, spleen: Secondary | ICD-10-CM | POA: Diagnosis present

## 2021-08-09 MED ORDER — IOHEXOL 300 MG/ML  SOLN
100.0000 mL | Freq: Once | INTRAMUSCULAR | Status: AC | PRN
  Administered 2021-08-09: 100 mL via INTRAVENOUS

## 2021-08-09 NOTE — Progress Notes (Signed)
I spoke to patient regarding results of the CT scan negative for any concern for malignancy.   fatty liver/hepatomegaly which could potentially explain patients abdominal discomfort. Discussed regarding weight loss/new alcohol/eating more green tea vegetables. Also  Agree withevaluation with G.I./ As ordered by PCP.  Follow up with me as planned.

## 2021-08-29 ENCOUNTER — Other Ambulatory Visit: Payer: Self-pay

## 2021-08-29 ENCOUNTER — Ambulatory Visit
Admission: RE | Admit: 2021-08-29 | Discharge: 2021-08-29 | Disposition: A | Discharge status: Home / Self Care | Payer: 59 | Source: Ambulatory Visit | Attending: Family Medicine | Admitting: Family Medicine

## 2021-08-29 DIAGNOSIS — Z1231 Encounter for screening mammogram for malignant neoplasm of breast: Secondary | ICD-10-CM | POA: Insufficient documentation

## 2021-09-14 ENCOUNTER — Other Ambulatory Visit: Payer: Self-pay

## 2021-09-14 ENCOUNTER — Ambulatory Visit (INDEPENDENT_AMBULATORY_CARE_PROVIDER_SITE_OTHER): Payer: 59 | Admitting: Gastroenterology

## 2021-09-14 ENCOUNTER — Encounter: Payer: Self-pay | Admitting: Gastroenterology

## 2021-09-14 VITALS — BP 150/109 | HR 86 | Temp 99.2°F | Ht 68.0 in | Wt 212.2 lb

## 2021-09-14 DIAGNOSIS — R195 Other fecal abnormalities: Secondary | ICD-10-CM

## 2021-09-14 DIAGNOSIS — R140 Abdominal distension (gaseous): Secondary | ICD-10-CM

## 2021-09-14 DIAGNOSIS — K529 Noninfective gastroenteritis and colitis, unspecified: Secondary | ICD-10-CM

## 2021-09-14 DIAGNOSIS — R1013 Epigastric pain: Secondary | ICD-10-CM | POA: Diagnosis not present

## 2021-09-14 DIAGNOSIS — R1010 Upper abdominal pain, unspecified: Secondary | ICD-10-CM

## 2021-09-14 MED ORDER — NA SULFATE-K SULFATE-MG SULF 17.5-3.13-1.6 GM/177ML PO SOLN: 354.0000 mL | Freq: Once | ORAL | 0 refills | Status: AC

## 2021-09-14 NOTE — Progress Notes (Signed)
Cephas Darby, MD [address]  [place]  [place],  [address]  Main: [phone]  Fax: [phone]    Gastroenterology Consultation  Referring Provider:     Marguerita Merles, MD Primary Care Physician:  Marguerita Merles, MD Primary Gastroenterologist:  Dr. Cephas Darby Reason for Consultation:    Upper abdominal pain, postprandial urgency, loose stools        HPI:   Bethany Kelly is a 51 y.o. female referred by Dr. Lennox Grumbles, Connye Burkitt, MD  for consultation & management of chronic upper abdominal discomfort. 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 She has 5 years history of chronic central abdominal discomfort, feels heavy, worse after eating, associated with urgency, 2-3 very soft, nonbloody bowel movements. She does have intermittent bloating. She denies any particular food triggers for her symptoms. She denies any other upper GI symptoms   She denies tobacco or alcohol. Reports occasional NSAID use She is adopted She had history of uterine and rectal prolapse from vaginal area, underwent hysterectomy.   GI Procedures: None  Past Medical History:  Diagnosis Date   Anemia    Anxiety    Chronic kidney disease    kidney stones   Chronic sinusitis    Diffuse large B cell lymphoma ([place]) 2013   Fatigue    GERD (gastroesophageal reflux disease)    Hypertension    past history of hypertension   Hypoalbuminemia    Personal history of chemotherapy June 2013 , and  March 2014   Weight loss     Past Surgical History:  Procedure Laterality Date   ABDOMINAL HYSTERECTOMY     bladder tacking     INGUINAL LYMPH NODE BIOPSY Right 04/21/2016    INGUINAL LYMPH NODE BIOPSY;  REACTIVE LYMPHADENOPATHY.  Robert Bellow, MD;  [facility] ORS;General;  Laterality: Right;   LIMBAL STEM  CELL TRANSPLANT  12/2012   PORTA CATH REMOVAL N/A 09/03/2017   Procedure: PORTA CATH REMOVAL;  Surgeon: Algernon Huxley, MD;  Location: [facility];  Service: Cardiovascular;  Laterality: N/A;   PORTACATH PLACEMENT  2013   RECTAL PROLAPSE REPAIR     TUBAL LIGATION     Current Outpatient Medications:    albuterol (PROVENTIL HFA;VENTOLIN HFA) 108 (90 Base) MCG/ACT inhaler, Inhale 2 puffs into the lungs every 6 (six) hours as needed for wheezing or shortness of breath., Disp: 1 Inhaler, Rfl: 2   APPLE CIDER VINEGAR PO, Take by mouth. capsules, Disp: , Rfl:    azelastine (ASTELIN) 0.1 % nasal spray, Place into the nose., Disp: , Rfl:    hydrochlorothiazide (HYDRODIURIL) 25 MG tablet, Take 12.5 mg by mouth daily., Disp: , Rfl:    lisinopril (ZESTRIL) 10 MG tablet, Take 10 mg by mouth daily., Disp: , Rfl:    MULTIPLE VITAMIN PO, Take by mouth., Disp: , Rfl:    Na Sulfate-K Sulfate-Mg Sulf 17.5-3.13-1.6 GM/177ML SOLN, Take 354 mLs by mouth once for 1 dose., Disp: 354 mL, Rfl: 0   ondansetron (ZOFRAN ODT) 4 MG disintegrating tablet, Take 1 tablet (4 mg total) by mouth every 8 (eight) hours as needed for nausea or vomiting., Disp: 15 tablet, Rfl: 0   TURMERIC PO, Take by mouth., Disp: , Rfl:  No current facility-administered medications for this visit.  Facility-Administered Medications  Ordered in Other Visits:    heparin lock flush 100 unit/mL, 500 Units, Intracatheter, PRN, Charlaine Dalton R, MD   sodium chloride flush (NS) 0.9 % injection 10 mL, 10 mL, Intravenous, PRN, Rogue Bussing, Govinda R, MD   sodium chloride flush (NS) 0.9 % injection 10 mL, 10 mL, Intracatheter, PRN, Cammie Sickle, MD  Family History  Adopted: Yes  Family history unknown: Yes     Social History   Tobacco Use   Smoking status: Former    Packs/day: 1.00    Types: Cigarettes    Quit date: 12/16/1998    Years since quitting: 22.7   Smokeless tobacco: Never  Substance Use Topics   Alcohol use: Yes     Alcohol/week: 4.0 standard drinks    Types: 4 Glasses of wine per week   Drug use: No    Allergies as of 09/14/2021 - Review Complete 09/14/2021  Allergen Reaction Noted   Penicillins Shortness Of Breath, Swelling, and Anaphylaxis 11/17/2014    Review of Systems:    All systems reviewed and negative except where noted in HPI.   Physical Exam:  BP (!) 150/109 (BP Location: Left Arm, Patient Position: Sitting, Cuff Size: Normal)    Pulse 86    Temp 99.2 F (37.3 C) (Oral)    Ht 5\' 8"  (1.727 m)    Wt 212 lb 4 oz (96.3 kg)    BMI 32.27 kg/m  No LMP recorded. Patient has had a hysterectomy.  General:   Alert,  Well-developed, well-nourished, pleasant and cooperative in NAD Head:  Normocephalic and atraumatic. Eyes:  Sclera clear, no icterus.   Conjunctiva pink. Ears:  Normal auditory acuity. Nose:  No deformity, discharge, or lesions. Mouth:  No deformity or lesions,oropharynx pink & moist. Neck:  Supple; no masses or thyromegaly. Lungs:  Respirations even and unlabored.  Clear throughout to auscultation.   No wheezes, crackles, or rhonchi. No acute distress. Heart:  Regular rate and rhythm; no murmurs, clicks, rubs, or gallops. Abdomen:  Normal bowel sounds. Obese, Soft, non-tender and mildly distended, tympanic to percussion without masses, hepatosplenomegaly or hernias noted.  No guarding or rebound tenderness.   Rectal: Nor performed Msk:  Symmetrical without gross deformities. Good, equal movement & strength bilaterally. Pulses:  Normal pulses noted. Extremities:  No clubbing or edema.  No cyanosis. Neurologic:  Alert and oriented x3;  grossly normal neurologically. Skin:  Intact without significant lesions or rashes. No jaundice. Psych:  Alert and cooperative. Normal mood and affect.  Imaging Studies: CT A/P from 01/2017 showed hepatic steatosis  Assessment and Plan:   SALINA STANFIELD is a 51 y.o. female with Obesity, diffuse large B-cell lymphoma status post chemotherapy  and stem cell transplant in remission presents with chronic upper abdominal discomfort and loose frequent, nonbloody bowel movements associated with intermittent bloating, the symptoms are particularly bad postprandial.  She does report symptoms worse during stressful situations. She does not have anemia.   Recommend EGD and colonoscopy with gastric, duodenal biopsies, likely TI evaluation and random colon biopsies Advised patient to cut back on drinking sweetened green tea Trial of IB-guard, samples provided  I have discussed alternative options, risks & benefits,  which include, but are not limited to, bleeding, infection, perforation,respiratory complication & drug reaction.  The patient agrees with this plan & written consent will be obtained.     Follow up based on the above work-up   Cephas Darby, MD

## 2021-09-14 NOTE — Patient Instructions (Signed)
Gave samples of Ibguard

## 2021-10-03 ENCOUNTER — Encounter: Payer: Self-pay | Admitting: Gastroenterology

## 2021-10-03 ENCOUNTER — Encounter
Admission: RE | Disposition: A | Discharge status: Home / Self Care | Payer: Self-pay | Source: Home / Self Care | Attending: Gastroenterology

## 2021-10-03 ENCOUNTER — Ambulatory Visit: Payer: 59 | Admitting: Certified Registered Nurse Anesthetist

## 2021-10-03 ENCOUNTER — Ambulatory Visit
Admission: RE | Admit: 2021-10-03 | Discharge: 2021-10-03 | Disposition: A | Discharge status: Home / Self Care | Payer: 59 | Attending: Gastroenterology | Admitting: Gastroenterology

## 2021-10-03 DIAGNOSIS — Z87891 Personal history of nicotine dependence: Secondary | ICD-10-CM | POA: Diagnosis not present

## 2021-10-03 DIAGNOSIS — D123 Benign neoplasm of transverse colon: Secondary | ICD-10-CM | POA: Insufficient documentation

## 2021-10-03 DIAGNOSIS — D125 Benign neoplasm of sigmoid colon: Secondary | ICD-10-CM | POA: Diagnosis not present

## 2021-10-03 DIAGNOSIS — K5730 Diverticulosis of large intestine without perforation or abscess without bleeding: Secondary | ICD-10-CM | POA: Insufficient documentation

## 2021-10-03 DIAGNOSIS — R140 Abdominal distension (gaseous): Secondary | ICD-10-CM | POA: Diagnosis not present

## 2021-10-03 DIAGNOSIS — R195 Other fecal abnormalities: Secondary | ICD-10-CM

## 2021-10-03 DIAGNOSIS — R1010 Upper abdominal pain, unspecified: Secondary | ICD-10-CM | POA: Insufficient documentation

## 2021-10-03 DIAGNOSIS — K621 Rectal polyp: Secondary | ICD-10-CM | POA: Insufficient documentation

## 2021-10-03 DIAGNOSIS — R197 Diarrhea, unspecified: Secondary | ICD-10-CM | POA: Diagnosis not present

## 2021-10-03 DIAGNOSIS — K635 Polyp of colon: Secondary | ICD-10-CM | POA: Diagnosis not present

## 2021-10-03 DIAGNOSIS — I10 Essential (primary) hypertension: Secondary | ICD-10-CM | POA: Diagnosis not present

## 2021-10-03 DIAGNOSIS — E669 Obesity, unspecified: Secondary | ICD-10-CM | POA: Diagnosis not present

## 2021-10-03 DIAGNOSIS — Z6830 Body mass index (BMI) 30.0-30.9, adult: Secondary | ICD-10-CM | POA: Diagnosis not present

## 2021-10-03 DIAGNOSIS — D124 Benign neoplasm of descending colon: Secondary | ICD-10-CM | POA: Diagnosis not present

## 2021-10-03 HISTORY — PX: COLONOSCOPY WITH PROPOFOL: SHX5780

## 2021-10-03 HISTORY — PX: ESOPHAGOGASTRODUODENOSCOPY (EGD) WITH PROPOFOL: SHX5813

## 2021-10-03 SURGERY — COLONOSCOPY WITH PROPOFOL
Anesthesia: General

## 2021-10-03 MED ORDER — SODIUM CHLORIDE 0.9 % IV SOLN
INTRAVENOUS | Status: DC
  Administered 2021-10-03: 20 mL/h via INTRAVENOUS

## 2021-10-03 MED ORDER — PROPOFOL 500 MG/50ML IV EMUL
INTRAVENOUS | Status: DC | PRN
  Administered 2021-10-03: 150 ug/kg/min via INTRAVENOUS

## 2021-10-03 MED ORDER — PROPOFOL 10 MG/ML IV BOLUS
INTRAVENOUS | Status: DC | PRN
  Administered 2021-10-03: 10 mg via INTRAVENOUS
  Administered 2021-10-03: 60 mg via INTRAVENOUS
  Administered 2021-10-03: 20 mg via INTRAVENOUS
  Administered 2021-10-03: 30 mg via INTRAVENOUS
  Administered 2021-10-03: 20 mg via INTRAVENOUS
  Administered 2021-10-03: 10 mg via INTRAVENOUS
  Administered 2021-10-03: 20 mg via INTRAVENOUS

## 2021-10-03 MED ORDER — LIDOCAINE HCL (CARDIAC) PF 100 MG/5ML IV SOSY
PREFILLED_SYRINGE | INTRAVENOUS | Status: DC | PRN
  Administered 2021-10-03: 50 mg via INTRAVENOUS

## 2021-10-03 NOTE — Transfer of Care (Signed)
Immediate Anesthesia Transfer of Care Note ? ?Patient: Bethany Kelly ? ?Procedure(s) Performed: COLONOSCOPY WITH PROPOFOL ?ESOPHAGOGASTRODUODENOSCOPY (EGD) WITH PROPOFOL ? ?Patient Location: Endoscopy Unit ? ?Anesthesia Type:General ? ?Level of Consciousness: drowsy ? ?Airway & Oxygen Therapy: Patient Spontanous Breathing ? ?Post-op Assessment: Report given to RN and Post -op Vital signs reviewed and stable ? ?Post vital signs: Reviewed and stable ? ?Last Vitals:  ?Vitals Value Taken Time  ?BP 129/66 10/03/21 0942  ?Temp    ?Pulse 83 10/03/21 0943  ?Resp 19 10/03/21 0943  ?SpO2 96 % 10/03/21 0943  ?Vitals shown include unvalidated device data. ? ?Last Pain:  ?Vitals:  ? 10/03/21 0757  ?TempSrc: Temporal  ?PainSc: 0-No pain  ?   ? ?  ? ?Complications: No notable events documented. ?

## 2021-10-03 NOTE — Anesthesia Postprocedure Evaluation (Signed)
Anesthesia Post Note ? ?Patient: Bethany Kelly ? ?Procedure(s) Performed: COLONOSCOPY WITH PROPOFOL ?ESOPHAGOGASTRODUODENOSCOPY (EGD) WITH PROPOFOL ? ?Patient location during evaluation: Endoscopy ?Anesthesia Type: General ?Level of consciousness: awake and alert ?Pain management: pain level controlled ?Vital Signs Assessment: post-procedure vital signs reviewed and stable ?Respiratory status: spontaneous breathing, nonlabored ventilation and respiratory function stable ?Cardiovascular status: blood pressure returned to baseline and stable ?Postop Assessment: no apparent nausea or vomiting ?Anesthetic complications: no ? ? ?No notable events documented. ? ? ?Last Vitals:  ?Vitals:  ? 10/03/21 1002 10/03/21 1012  ?BP: 137/85 (!) 142/85  ?Pulse: 78 72  ?Resp: 18 13  ?Temp:    ?SpO2: 98% 100%  ?  ?Last Pain:  ?Vitals:  ? 10/03/21 1012  ?TempSrc:   ?PainSc: 0-No pain  ? ? ?  ?  ?  ?  ?  ?  ? ?[place] Ouch ? ? ? ? ?

## 2021-10-03 NOTE — H&P (Signed)
?Cephas Darby, MD ?[address]  ?[address]  ?[place], [place] [address]  ?Main: [phone]  ?Fax: [phone] ?Pager: [phone] ? ?Primary Care Physician:  Marguerita Merles, MD ?Primary Gastroenterologist:  Dr. Cephas Darby ? ?Pre-Procedure History & Physical: ?HPI:  Bethany Kelly is a 51 y.o. female is here for an endoscopy and colonoscopy. ?  ?Past Medical History:  ?Diagnosis Date  ? Anemia   ? Anxiety   ? Chronic kidney disease   ? kidney stones  ? Chronic sinusitis   ? Diffuse large B cell lymphoma ([place]) 2013  ? Fatigue   ? GERD (gastroesophageal reflux disease)   ? Hypertension   ? past history of hypertension  ? Hypoalbuminemia   ? Personal history of chemotherapy June 2013 , and  March 2014  ? Weight loss   ? ? ?Past Surgical History:  ?Procedure Laterality Date  ? ABDOMINAL HYSTERECTOMY    ? bladder tacking    ? INGUINAL LYMPH NODE BIOPSY Right 04/21/2016  ?  INGUINAL LYMPH NODE BIOPSY;  REACTIVE LYMPHADENOPATHY.  Robert Bellow, MD;  [facility];General;  Laterality: Right;  ? LIMBAL STEM CELL TRANSPLANT  12/2012  ? PORTA CATH REMOVAL N/A 09/03/2017  ? Procedure: PORTA CATH REMOVAL;  Surgeon: Algernon Huxley, MD;  Location: [facility];  Service: Cardiovascular;  Laterality: N/A;  ? PORTACATH PLACEMENT  2013  ? RECTAL PROLAPSE REPAIR    ? TUBAL LIGATION    ? ? ?Prior to Admission medications   ?Medication Sig Start Date End Date Taking? Authorizing Provider  ?albuterol (PROVENTIL HFA;VENTOLIN HFA) 108 (90 Base) MCG/ACT inhaler Inhale 2 puffs into the lungs every 6 (six) hours as needed for wheezing or shortness of breath. 10/09/18  Yes Cammie Sickle, MD  ?APPLE CIDER VINEGAR PO Take by mouth. capsules   Yes [provider]  ?azelastine (ASTELIN) 0.1 % nasal spray Place into the nose. 08/15/21  Yes [provider]  ?hydrochlorothiazide (HYDRODIURIL) 25 MG tablet Take 12.5 mg by mouth daily.   Yes [provider]  ?lisinopril (ZESTRIL) 10 MG tablet Take 10  mg by mouth daily.   Yes [provider]  ?MULTIPLE VITAMIN PO Take by mouth.   Yes [provider]  ?ondansetron (ZOFRAN ODT) 4 MG disintegrating tablet Take 1 tablet (4 mg total) by mouth every 8 (eight) hours as needed for nausea or vomiting. 08/30/20  Yes Paulette Blanch, MD  ?TURMERIC PO Take by mouth.   Yes [provider]  ? ? ?Allergies as of 09/14/2021 - Review Complete 09/14/2021  ?Allergen Reaction Noted  ? Penicillins Shortness Of Breath, Swelling, and Anaphylaxis 11/17/2014  ? ? ?Family History  ?Adopted: Yes  ?Family history unknown: Yes  ? ? ?Social History  ? ?Socioeconomic History  ? Marital status: Married  ?  Spouse name: Not on file  ? Number of children: Not on file  ? Years of education: Not on file  ? Highest education level: Not on file  ?Occupational History  ? Not on file  ?Tobacco Use  ? Smoking status: Former  ?  Packs/day: 1.00  ?  Types: Cigarettes  ?  Quit date: 12/16/1998  ?  Years since quitting: 22.8  ? Smokeless tobacco: Never  ?Substance and Sexual Activity  ? Alcohol use: Yes  ?  Alcohol/week: 4.0 standard drinks  ?  Types: 4 Glasses of wine per week  ? Drug use: No  ? Sexual activity: Not on file  ?  Other Topics Concern  ? Not on file  ?Social History Narrative  ? Not on file  ? ?Social Determinants of Health  ? ?Financial Resource Strain: Not on file  ?Food Insecurity: Not on file  ?Transportation Needs: Not on file  ?Physical Activity: Not on file  ?Stress: Not on file  ?Social Connections: Not on file  ?Intimate Partner Violence: Not on file  ? ? ?Review of Systems: ?See HPI, otherwise negative ROS ? ?Physical Exam: ?BP (!) 130/101   Pulse 89   Temp (!) 97.1 ?F (36.2 ?C) (Temporal)   Resp 20   Ht '5\' 8"'$  (1.727 m)   Wt 91.2 kg   SpO2 98%   BMI 30.56 kg/m?  ?General:   Alert,  pleasant and cooperative in NAD ?Head:  Normocephalic and atraumatic. ?Neck:  Supple; no masses or thyromegaly. ?Lungs:  Clear throughout to auscultation.    ?Heart:  Regular  rate and rhythm. ?Abdomen:  Soft, nontender and nondistended. Normal bowel sounds, without guarding, and without rebound.   ?Neurologic:  Alert and  oriented x4;  grossly normal neurologically. ? ?Impression/Plan: ?Bethany Kelly is here for an endoscopy and colonoscopy to be performed for chronic upper abdominal discomfort and loose frequent, nonbloody bowel movements associated with intermittent bloating ? ?Risks, benefits, limitations, and alternatives regarding  endoscopy and colonoscopy have been reviewed with the patient.  Questions have been answered.  All parties agreeable. ? ? ?Sherri Sear, MD  10/03/2021, 8:23 AM ?

## 2021-10-03 NOTE — Anesthesia Procedure Notes (Addendum)
Procedure Name: [place] ?Date/Time: 10/03/2021 8:57 AM ?Performed by: Tollie Eth, CRNA ?Pre-anesthesia Checklist: Patient identified, Emergency Drugs available, Suction available and Patient being monitored ?Patient Re-evaluated:Patient Re-evaluated prior to induction ?Oxygen Delivery Method: Nasal cannula and Simple face mask ?Induction Type: IV induction ?Placement Confirmation: positive ETCO2 ? ? ? ? ?

## 2021-10-03 NOTE — Op Note (Signed)
[facility] ?Gastroenterology ?Patient Name: Bethany Kelly ?Procedure Date: 10/03/2021 8:59 AM ?MRN: [id number] ?Account #: [id number] ?Date of Birth: [birth year] ?Admit Type: Outpatient ?Age: 50 ?Room: [facility] ENDO ROOM 1 ?Gender: Female ?Note Status: Finalized ?Instrument Name: Colonscope [id number] ?Procedure:             Colonoscopy ?Indications:           Upper abdominal pain, Clinically significant diarrhea  ?                       of unexplained origin ?Providers:             Lin Landsman MD, MD ?Referring MD:          Marguerita Merles, MD (Referring MD) ?Medicines:             General Anesthesia ?Complications:         No immediate complications. Estimated blood loss: None. ?Procedure:             Pre-Anesthesia Assessment: ?                       - Prior to the procedure, a History and Physical was  ?                       performed, and patient medications and allergies were  ?                       reviewed. The patient is competent. The risks and  ?                       benefits of the procedure and the sedation options and  ?                       risks were discussed with the patient. All questions  ?                       were answered and informed consent was obtained.  ?                       Patient identification and proposed procedure were  ?                       verified by the physician, the nurse, the  ?                       anesthesiologist, the anesthetist and the technician  ?                       in the pre-procedure area in the procedure room in the  ?                       endoscopy suite. Mental Status Examination: alert and  ?                       oriented. Airway Examination: normal oropharyngeal  ?                       airway and neck mobility. Respiratory Examination:  ?  clear to auscultation. CV Examination: normal.  ?                       Prophylactic Antibiotics: The patient does not require  ?                       prophylactic  antibiotics. Prior Anticoagulants: The  ?                       patient has taken no previous anticoagulant or  ?                       antiplatelet agents. ASA Grade Assessment: II - A  ?                       patient with mild systemic disease. After reviewing  ?                       the risks and benefits, the patient was deemed in  ?                       satisfactory condition to undergo the procedure. The  ?                       anesthesia plan was to use general anesthesia.  ?                       Immediately prior to administration of medications,  ?                       the patient was re-assessed for adequacy to receive  ?                       sedatives. The heart rate, respiratory rate, oxygen  ?                       saturations, blood pressure, adequacy of pulmonary  ?                       ventilation, and response to care were monitored  ?                       throughout the procedure. The physical status of the  ?                       patient was re-assessed after the procedure. ?                       After obtaining informed consent, the colonoscope was  ?                       passed under direct vision. Throughout the procedure,  ?                       the patient's blood pressure, pulse, and oxygen  ?                       saturations were monitored continuously. The  ?  Colonoscope was introduced through the anus and  ?                       advanced to the 10 cm into the ileum. The colonoscopy  ?                       was performed without difficulty. The patient  ?                       tolerated the procedure well. The quality of the bowel  ?                       preparation was evaluated using the BBPS [facility] Bowel  ?                       Preparation Scale) with scores of: Right Colon = 3,  ?                       Transverse Colon = 3 and Left Colon = 3 (entire mucosa  ?                       seen well with no residual staining, small fragments  ?                        of stool or opaque liquid). The total BBPS score  ?                       equals 9. ?Findings: ?     The perianal and digital rectal examinations were normal. Pertinent  ?     negatives include normal sphincter tone and no palpable rectal lesions. ?     The terminal ileum appeared normal. ?     Normal mucosa was found in the entire colon. Biopsies were taken with a  ?     cold forceps for histology. ?     Four sessile polyps were found in the rectum, sigmoid colon, descending  ?     colon and transverse colon. The polyps were 3 to 5 mm in size. These  ?     polyps were removed with a cold snare. Resection and retrieval were  ?     complete. Estimated blood loss: none. ?     Two sessile polyps were found in the sigmoid colon. The polyps were  ?     diminutive in size. These polyps were removed with a cold biopsy  ?     forceps. Resection and retrieval were complete. ?     The retroflexed view of the distal rectum and anal verge was normal and  ?     showed no anal or rectal abnormalities. ?     Multiple diverticula were found in the recto-sigmoid colon and sigmoid  ?     colon. ?Impression:            - The examined portion of the ileum was normal. ?                       - Normal mucosa in the entire examined colon. Biopsied. ?                       -  Four 3 to 5 mm polyps in the rectum, in the sigmoid  ?                       colon, in the descending colon and in the transverse  ?                       colon, removed with a cold snare. Resected and  ?                       retrieved. ?                       - Two diminutive polyps in the sigmoid colon, removed  ?                       with a cold biopsy forceps. Resected and retrieved. ?                       - The distal rectum and anal verge are normal on  ?                       retroflexion view. ?                       - Diverticulosis in the recto-sigmoid colon and in the  ?                       sigmoid colon. ?Recommendation:        - Discharge  patient to home (with escort). ?                       - Resume previous diet today. ?                       - Continue present medications. ?                       - Await pathology results. ?                       - Repeat colonoscopy in 3 - 5 years for surveillance  ?                       of multiple polyps. ?                       - Return to my office as previously scheduled. ?Procedure Code(s):     --- Professional --- ?                       [phone], Colonoscopy, flexible; with removal of  ?                       tumor(s), polyp(s), or other lesion(s) by snare  ?                       technique ?                       45380, 59, Colonoscopy, flexible; with biopsy, single  ?  or multiple ?Diagnosis Code(s):     --- Professional --- ?                       K62.1, Rectal polyp ?                       K63.5, Polyp of colon ?                       R10.10, Upper abdominal pain, unspecified ?                       R19.7, Diarrhea, unspecified ?                       K57.30, Diverticulosis of large intestine without  ?                       perforation or abscess without bleeding ?CPT copyright 2019 American Medical Association. All rights reserved. ?The codes documented in this report are preliminary and upon coder review may  ?be revised to meet current compliance requirements. ?Dr. Ulyess Mort ?Martino Tompson Raeanne Gathers MD, MD ?10/03/2021 9:43:10 AM ?This report has been signed electronically. ?Number of Addenda: 0 ?Note Initiated On: 10/03/2021 8:59 AM ?Scope Withdrawal Time: 0 hours 16 minutes 3 seconds  ?Total Procedure Duration: 0 hours 22 minutes 17 seconds  ?Estimated Blood Loss:  Estimated blood loss: none. ?     [facility] ?

## 2021-10-03 NOTE — Anesthesia Preprocedure Evaluation (Addendum)
Anesthesia Evaluation  ?Patient identified by MRN, date of birth, ID band ?Patient awake ? ? ? ?Reviewed: ?Allergy & Precautions, NPO status , Patient's Chart, lab work & pertinent test results ? ?Airway ?Mallampati: II ? ?TM Distance: >3 FB ?Neck ROM: full ? ? ? Dental ?no notable dental hx. ? ?  ?Pulmonary ?neg pulmonary ROS, former smoker,  ?  ?Pulmonary exam normal ? ? ? ? ? ? ? Cardiovascular ?hypertension, Pt. on medications ?Normal cardiovascular exam ? ?ECHO 10/22: ?INTERPRETATION  ?NORMAL LEFT VENTRICULAR SYSTOLIC FUNCTION ? WITH MILD LVH  ?NORMAL RIGHT VENTRICULAR SYSTOLIC FUNCTION  ?NO VALVULAR STENOSIS  ?MILD TR ? ?  ?Neuro/Psych ?negative neurological ROS ? negative psych ROS  ? GI/Hepatic ?Neg liver ROS, GERD  Controlled,Upper abdominal pain ?Loose Stools ?  ?Endo/Other  ?negative endocrine ROS ? Renal/GU ?negative Renal ROS  ?negative genitourinary ?  ?Musculoskeletal ? ? Abdominal ?(+) + obese,   ?Peds ? Hematology ?H/O diffuse large B cell lymphoma s/o chemotherapy and bone marrow transplant ?   ?Anesthesia Other Findings ?Past Medical History: ?No date: Anemia ?No date: Anxiety ?No date: Chronic kidney disease ?    Comment:  kidney stones ?No date: Chronic sinusitis ?2013: Diffuse large B cell lymphoma ([place]) ?No date: Fatigue ?No date: GERD (gastroesophageal reflux disease) ?No date: Hypertension ?    Comment:  past history of hypertension ?No date: Hypoalbuminemia ?June 2013 , and  March 2014: Personal history of chemotherapy ?No date: Weight loss ? ?Past Surgical History: ?No date: ABDOMINAL HYSTERECTOMY ?No date: bladder tacking ?04/21/2016: INGUINAL LYMPH NODE BIOPSY; Right ?    Comment:   INGUINAL LYMPH NODE BIOPSY;  REACTIVE LYMPHADENOPATHY.  ?             Robert Bellow, MD;  [facility];General;  Laterality:  ?             Right; ?12/2012: LIMBAL STEM CELL TRANSPLANT ?09/03/2017: PORTA CATH REMOVAL; N/A ?    Comment:  Procedure: PORTA CATH REMOVAL;  Surgeon:  Algernon Huxley,  ?             MD;  Location: [facility];  Service:  ?             Cardiovascular;  Laterality: N/A; ?2013: PORTACATH PLACEMENT ?No date: RECTAL PROLAPSE REPAIR ?No date: TUBAL LIGATION ? ? ? ? Reproductive/Obstetrics ?negative OB ROS ? ?  ? ? ? ? ? ? ? ? ? ? ? ? ? ?  ?  ? ? ? ? ? ? ? ?Anesthesia Physical ?Anesthesia Plan ? ?ASA: 2 ? ?Anesthesia Plan: General  ? ?Post-op Pain Management:   ? ?Induction:  ? ?PONV Risk Score and Plan: Propofol infusion and TIVA ? ?Airway Management Planned: Natural Airway and Simple Face Mask ? ?Additional Equipment:  ? ?Intra-op Plan:  ? ?Post-operative Plan:  ? ?Informed Consent: I have reviewed the patients History and Physical, chart, labs and discussed the procedure including the risks, benefits and alternatives for the proposed anesthesia with the patient or authorized representative who has indicated his/her understanding and acceptance.  ? ? ? ?Dental advisory given ? ?Plan Discussed with: Anesthesiologist, CRNA and Surgeon ? ?Anesthesia Plan Comments:   ? ? ? ? ? ?Anesthesia Quick Evaluation ? ?

## 2021-10-03 NOTE — Op Note (Signed)
[facility] ?Gastroenterology ?Patient Name: Bethany Kelly ?Procedure Date: 10/03/2021 9:00 AM ?MRN: [id number] ?Account #: [id number] ?Date of Birth: [birth year] ?Admit Type: Outpatient ?Age: 51 ?Room: [facility] ENDO ROOM 1 ?Gender: Female ?Note Status: Finalized ?Instrument Name: Upper Endoscope [id number] ?Procedure:             Upper GI endoscopy ?Indications:           Abdominal bloating, Diarrhea ?Providers:             Lin Landsman MD, MD ?Referring MD:          Marguerita Merles, MD (Referring MD) ?Medicines:             General Anesthesia ?Complications:         No immediate complications. Estimated blood loss: None. ?Procedure:             Pre-Anesthesia Assessment: ?                       - Prior to the procedure, a History and Physical was  ?                       performed, and patient medications and allergies were  ?                       reviewed. The patient is competent. The risks and  ?                       benefits of the procedure and the sedation options and  ?                       risks were discussed with the patient. All questions  ?                       were answered and informed consent was obtained.  ?                       Patient identification and proposed procedure were  ?                       verified by the physician, the nurse, the  ?                       anesthesiologist, the anesthetist and the technician  ?                       in the pre-procedure area in the procedure room in the  ?                       endoscopy suite. Mental Status Examination: alert and  ?                       oriented. Airway Examination: normal oropharyngeal  ?                       airway and neck mobility. Respiratory Examination:  ?                       clear to auscultation. CV Examination: normal.  ?  Prophylactic Antibiotics: The patient does not require  ?                       prophylactic antibiotics. Prior Anticoagulants: The  ?                        patient has taken no previous anticoagulant or  ?                       antiplatelet agents. ASA Grade Assessment: II - A  ?                       patient with mild systemic disease. After reviewing  ?                       the risks and benefits, the patient was deemed in  ?                       satisfactory condition to undergo the procedure. The  ?                       anesthesia plan was to use general anesthesia.  ?                       Immediately prior to administration of medications,  ?                       the patient was re-assessed for adequacy to receive  ?                       sedatives. The heart rate, respiratory rate, oxygen  ?                       saturations, blood pressure, adequacy of pulmonary  ?                       ventilation, and response to care were monitored  ?                       throughout the procedure. The physical status of the  ?                       patient was re-assessed after the procedure. ?                       After obtaining informed consent, the endoscope was  ?                       passed under direct vision. Throughout the procedure,  ?                       the patient's blood pressure, pulse, and oxygen  ?                       saturations were monitored continuously. The Endoscope  ?                       was introduced through the mouth, and advanced to the  ?  second part of duodenum. The upper GI endoscopy was  ?                       accomplished without difficulty. The patient tolerated  ?                       the procedure well. ?Findings: ?     The examined duodenum was normal. Biopsies for histology were taken with  ?     a cold forceps for evaluation of celiac disease. ?     The entire examined stomach was normal. Biopsies were taken with a cold  ?     forceps for Helicobacter pylori testing. ?     The cardia and gastric fundus were normal on retroflexion. ?     Esophagogastric landmarks were identified: the gastroesophageal  junction  ?     was found at 40 cm from the incisors. ?     The gastroesophageal junction and examined esophagus were normal. ?Impression:            - Normal examined duodenum. Biopsied. ?                       - Normal stomach. Biopsied. ?                       - Esophagogastric landmarks identified. ?                       - Normal gastroesophageal junction and esophagus. ?Recommendation:        - Await pathology results. ?                       - Proceed with colonoscopy as scheduled ?                       See colonoscopy report ?Procedure Code(s):     --- Professional --- ?                       [phone], Esophagogastroduodenoscopy, flexible,  ?                       transoral; with biopsy, single or multiple ?Diagnosis Code(s):     --- Professional --- ?                       R14.0, Abdominal distension (gaseous) ?                       R19.7, Diarrhea, unspecified ?CPT copyright 2019 American Medical Association. All rights reserved. ?The codes documented in this report are preliminary and upon coder review may  ?be revised to meet current compliance requirements. ?Dr. Ulyess Mort ?Dajuana Palen Raeanne Gathers MD, MD ?10/03/2021 9:15:26 AM ?This report has been signed electronically. ?Number of Addenda: 0 ?Note Initiated On: 10/03/2021 9:00 AM ?Estimated Blood Loss:  Estimated blood loss: none. ?     [facility] ?

## 2021-10-04 ENCOUNTER — Encounter: Payer: Self-pay | Admitting: Gastroenterology

## 2021-10-04 LAB — SURGICAL PATHOLOGY

## 2022-04-12 ENCOUNTER — Ambulatory Visit (INDEPENDENT_AMBULATORY_CARE_PROVIDER_SITE_OTHER): Payer: 59 | Admitting: Podiatry

## 2022-04-12 DIAGNOSIS — L600 Ingrowing nail: Secondary | ICD-10-CM

## 2022-04-12 MED ORDER — NEOMYCIN-POLYMYXIN-HC 3.5-10000-1 OT SOLN: OTIC | 0 refills | Status: AC

## 2022-04-12 NOTE — Progress Notes (Signed)
Subjective:  Patient ID: Bethany Kelly, female    DOB: [birth year],  MRN: [id number] HPI Chief Complaint  Patient presents with   Nail Problem    "I've got a severe ingrown toenail on my right foot." N - ingrown toenail L - hallux right D - 6 mos O - gradually worse C - excruciating pain A - pressure, steel toe shoe, bump it T - tried to trim it    51 y.o. female presents with the above complaint.   ROS: Denies fever chills nausea vomiting muscle aches pains calf pain back pain chest pain shortness of breath.  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   Fatigue    GERD (gastroesophageal reflux disease)    Hypertension    past history of hypertension   Hypoalbuminemia    Personal history of chemotherapy June 2013 , and  March 2014   Weight loss    Past Surgical History:  Procedure Laterality Date   ABDOMINAL HYSTERECTOMY     bladder tacking     COLONOSCOPY WITH PROPOFOL N/A 10/03/2021   Procedure: COLONOSCOPY WITH PROPOFOL;  Surgeon: Lin Landsman, MD;  Location: [facility] ENDOSCOPY;  Service: Gastroenterology;  Laterality: N/A;   ESOPHAGOGASTRODUODENOSCOPY (EGD) WITH PROPOFOL N/A 10/03/2021   Procedure: ESOPHAGOGASTRODUODENOSCOPY (EGD) WITH PROPOFOL;  Surgeon: Lin Landsman, MD;  Location: [facility] ENDOSCOPY;  Service: Gastroenterology;  Laterality: N/A;   INGUINAL LYMPH NODE BIOPSY Right 04/21/2016    INGUINAL LYMPH NODE BIOPSY;  REACTIVE LYMPHADENOPATHY.  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   PORTA CATH REMOVAL N/A 09/03/2017   Procedure: PORTA CATH REMOVAL;  Surgeon: Algernon Huxley, MD;  Location: [place] LAB;  Service: Cardiovascular;  Laterality: N/A;   PORTACATH PLACEMENT  2013   RECTAL PROLAPSE REPAIR     TUBAL LIGATION      Current Outpatient Medications:     neomycin-polymyxin-hydrocortisone (CORTISPORIN) OTIC solution, Apply one to two drops to toe after soaking twice daily., Disp: 10 mL, Rfl: 0   albuterol (PROVENTIL HFA;VENTOLIN HFA) 108 (90 Base) MCG/ACT inhaler, Inhale 2 puffs into the lungs every 6 (six) hours as needed for wheezing or shortness of breath., Disp: 1 Inhaler, Rfl: 2   APPLE CIDER VINEGAR PO, Take by mouth. capsules, Disp: , Rfl:    azelastine (ASTELIN) 0.1 % nasal spray, Place into the nose., Disp: , Rfl:    hydrochlorothiazide (HYDRODIURIL) 25 MG tablet, Take 12.5 mg by mouth daily., Disp: , Rfl:    lisinopril (ZESTRIL) 10 MG tablet, Take 10 mg by mouth daily., Disp: , Rfl:    MULTIPLE VITAMIN PO, Take by mouth., Disp: , Rfl:    ondansetron (ZOFRAN ODT) 4 MG disintegrating tablet, Take 1 tablet (4 mg total) by mouth every 8 (eight) hours as needed for nausea or vomiting., Disp: 15 tablet, Rfl: 0   TURMERIC PO, Take by mouth., Disp: , Rfl:  No current facility-administered medications for this visit.  Facility-Administered Medications Ordered in Other Visits:    heparin lock flush 100 unit/mL, 500 Units, Intracatheter, PRN, Charlaine Dalton R, MD   sodium chloride flush (NS) 0.9 % injection 10 mL, 10 mL, Intravenous, PRN, Charlaine Dalton R, MD   sodium chloride flush (NS) 0.9 % injection 10 mL, 10 mL,  Intracatheter, PRN, Cammie Sickle, MD  Allergies  Allergen Reactions   Penicillins Shortness Of Breath, Swelling and Anaphylaxis    "swelling of throat."   Review of Systems Objective:  There were no vitals filed for this visit.  General: Well developed, nourished, in no acute distress, alert and oriented x3   Dermatological: Skin is warm, dry and supple bilateral. Nails x 10 are well maintained; remaining integument appears unremarkable at this time. There are no open sores, no preulcerative lesions, no rash or signs of infection present.  Ingrown toenail tibial border hallux right exquisitely tender on  palpation.  Mild to moderate erythema no purulence no malodor.  Vascular: Dorsalis Pedis artery and Posterior Tibial artery pedal pulses are 2/4 bilateral with immedate capillary fill time. Pedal hair growth present. No varicosities and no lower extremity edema present bilateral.   Neruologic: Grossly intact via light touch bilateral. Vibratory intact via tuning fork bilateral. Protective threshold with Semmes Wienstein monofilament intact to all pedal sites bilateral. Patellar and Achilles deep tendon reflexes 2+ bilateral. No Babinski or clonus noted bilateral.   Musculoskeletal: No gross boney pedal deformities bilateral. No pain, crepitus, or limitation noted with foot and ankle range of motion bilateral. Muscular strength 5/5 in all groups tested bilateral.  Gait: Unassisted, Nonantalgic.    Radiographs:  None taken  Assessment & Plan:   Assessment: Ingrown toenail right hallux  Plan: Discussed etiology pathology conservative versus surgical therapies chemical matricectomy was performed today.  She tolerated procedure well without complications.  She was given both oral and written home-going instruction for care and soaking of the toe as well as a prescription of Cortisporin Otic to be applied twice daily after soaking.  We will follow-up with her in 2 weeks.  Questions or concerns she will notify us immediately.     Ceniyah Thorp T. [place], [place]

## 2022-04-12 NOTE — Patient Instructions (Signed)

## 2022-04-26 ENCOUNTER — Encounter: Payer: Self-pay | Admitting: Podiatry

## 2022-04-26 ENCOUNTER — Ambulatory Visit (INDEPENDENT_AMBULATORY_CARE_PROVIDER_SITE_OTHER): Payer: 59 | Admitting: Podiatry

## 2022-04-26 DIAGNOSIS — L600 Ingrowing nail: Secondary | ICD-10-CM

## 2022-04-26 DIAGNOSIS — Z9889 Other specified postprocedural states: Secondary | ICD-10-CM

## 2022-04-26 NOTE — Progress Notes (Signed)
She presents today for follow-up of her matrixectomy hallux right tibial and fibular border states that there is sitting here looking at it it looks a little bit red.  She states that she has not been soaking it any longer and she did not pick up the Corticosporin otic drops because they were out of stock at [facility].  So she has just been using Neosporin.  Objective: Vital signs are stable alert and oriented x3.  Pulses are palpable.  There is no cellulitis drainage or odor there is just some mild erythema no purulence.  Assessment: Well-healing surgical foot.  Plan: Recommended that she follow-up with me on an as-needed basis and return to her normal activity.

## 2022-05-30 ENCOUNTER — Other Ambulatory Visit: Payer: Self-pay | Admitting: Family Medicine

## 2022-05-30 DIAGNOSIS — R7401 Elevation of levels of liver transaminase levels: Secondary | ICD-10-CM

## 2022-05-31 ENCOUNTER — Telehealth: Payer: Self-pay | Admitting: Family Medicine

## 2022-06-23 NOTE — Telephone Encounter (Signed)
error 

## 2022-07-13 ENCOUNTER — Ambulatory Visit
Admission: RE | Admit: 2022-07-13 | Discharge: 2022-07-13 | Disposition: A | Discharge status: Home / Self Care | Payer: 59 | Source: Ambulatory Visit | Attending: Family Medicine | Admitting: Family Medicine

## 2022-07-13 DIAGNOSIS — R7401 Elevation of levels of liver transaminase levels: Secondary | ICD-10-CM | POA: Insufficient documentation

## 2022-08-08 ENCOUNTER — Encounter: Payer: Self-pay | Admitting: Internal Medicine

## 2022-08-08 ENCOUNTER — Ambulatory Visit: Payer: 59 | Admitting: Internal Medicine

## 2022-08-08 ENCOUNTER — Other Ambulatory Visit: Payer: 59

## 2022-08-08 ENCOUNTER — Inpatient Hospital Stay: Payer: 59 | Attending: Internal Medicine | Admitting: Internal Medicine

## 2022-08-08 ENCOUNTER — Inpatient Hospital Stay: Payer: 59

## 2022-08-08 VITALS — BP 122/93 | HR 83 | Temp 98.6°F | Resp 19 | Wt 223.0 lb

## 2022-08-08 DIAGNOSIS — Z79899 Other long term (current) drug therapy: Secondary | ICD-10-CM | POA: Insufficient documentation

## 2022-08-08 DIAGNOSIS — Z8572 Personal history of non-Hodgkin lymphomas: Secondary | ICD-10-CM | POA: Insufficient documentation

## 2022-08-08 DIAGNOSIS — R748 Abnormal levels of other serum enzymes: Secondary | ICD-10-CM | POA: Insufficient documentation

## 2022-08-08 DIAGNOSIS — Z9484 Stem cells transplant status: Secondary | ICD-10-CM | POA: Insufficient documentation

## 2022-08-08 DIAGNOSIS — I129 Hypertensive chronic kidney disease with stage 1 through stage 4 chronic kidney disease, or unspecified chronic kidney disease: Secondary | ICD-10-CM | POA: Diagnosis not present

## 2022-08-08 DIAGNOSIS — C8337 Diffuse large B-cell lymphoma, spleen: Secondary | ICD-10-CM | POA: Diagnosis not present

## 2022-08-08 DIAGNOSIS — Z90710 Acquired absence of both cervix and uterus: Secondary | ICD-10-CM | POA: Diagnosis not present

## 2022-08-08 DIAGNOSIS — N189 Chronic kidney disease, unspecified: Secondary | ICD-10-CM | POA: Diagnosis not present

## 2022-08-08 DIAGNOSIS — Z87891 Personal history of nicotine dependence: Secondary | ICD-10-CM | POA: Diagnosis not present

## 2022-08-08 LAB — COMPREHENSIVE METABOLIC PANEL
ALT: 83 U/L — ABNORMAL HIGH (ref 0–44)
AST: 54 U/L — ABNORMAL HIGH (ref 15–41)
Albumin: 3.9 g/dL (ref 3.5–5.0)
Alkaline Phosphatase: 51 U/L (ref 38–126)
Anion gap: 9 (ref 5–15)
BUN: 17 mg/dL (ref 6–20)
CO2: 27 mmol/L (ref 22–32)
Calcium: 8.8 mg/dL — ABNORMAL LOW (ref 8.9–10.3)
Chloride: 102 mmol/L (ref 98–111)
Creatinine, Ser: 1.07 mg/dL — ABNORMAL HIGH (ref 0.44–1.00)
GFR, Estimated: >60 mL/min (ref >60)
Glucose, Bld: 140 mg/dL — ABNORMAL HIGH (ref 70–99)
Potassium: 4 mmol/L (ref 3.5–5.1)
Sodium: 138 mmol/L (ref 135–145)
Total Bilirubin: 0.6 mg/dL (ref 0.3–1.2)
Total Protein: 7.3 g/dL (ref 6.5–8.1)

## 2022-08-08 LAB — CBC WITH DIFFERENTIAL/PLATELET
Abs Immature Granulocytes: 0.02 10*3/uL (ref 0.00–0.07)
Basophils Absolute: 0 10*3/uL (ref 0.0–0.1)
Basophils Relative: 1 %
Eosinophils Absolute: 0.1 10*3/uL (ref 0.0–0.5)
Eosinophils Relative: 1 %
HCT: 40.8 % (ref 36.0–46.0)
Hemoglobin: 13.9 g/dL (ref 12.0–15.0)
Immature Granulocytes: 0 %
Lymphocytes Relative: 33 %
Lymphs Abs: 1.9 10*3/uL (ref 0.7–4.0)
MCH: 32.6 pg (ref 26.0–34.0)
MCHC: 34.1 g/dL (ref 30.0–36.0)
MCV: 95.6 fL (ref 80.0–100.0)
Monocytes Absolute: 0.3 10*3/uL (ref 0.1–1.0)
Monocytes Relative: 5 %
Neutro Abs: 3.5 10*3/uL (ref 1.7–7.7)
Neutrophils Relative %: 60 %
Platelets: 236 10*3/uL (ref 150–400)
RBC: 4.27 MIL/uL (ref 3.87–5.11)
RDW: 11.5 % (ref 11.5–15.5)
WBC: 5.7 10*3/uL (ref 4.0–10.5)
nRBC: 0 % (ref 0.0–0.2)

## 2022-08-08 LAB — LACTATE DEHYDROGENASE: LDH: 168 U/L (ref 98–192)

## 2022-08-08 NOTE — Assessment & Plan Note (Addendum)
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 Stable.  # Abdominal discomfort-AST ALT elevated -likely fatty liver [once week on average- liquor]-status post EGD colonoscopy [Dr.vanga; 2023] I discussed discussed regarding weight loss/ no alcohol/eating more green leafy vegetables.  Also discussed evaluation with obesity medicine specialist.  # HTN [recent diagnosis- dec 2021]- continue BP medications as prescribed- STABLE.   # DISPOSITION: #  follow up in 12 months-MD/labs- cbc/cmp/LDH-Dr.B

## 2022-08-08 NOTE — Progress Notes (Signed)
Patient has no concerns today. 

## 2022-08-08 NOTE — Progress Notes (Signed)
[facility] OFFICE PROGRESS NOTE  Patient Care Team: Marguerita Merles, MD as PCP - General (Family Medicine) Cammie Sickle, MD as Consulting Physician (Internal Medicine) Bary Castilla Forest Gleason, MD (General Surgery)   Cancer Staging  No matching staging information was found for the patient.   Oncology History Overview Note  Chief Complaint/Diagnosis:   # 2013-  admitted with hypercalcemia and abnormal CT scan of the abdomen  2. 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 Patient was started on RCHOP chemotherapy June of 2013 4. High-dose methotrexate in alternate cycles starting from July 29. 5.last dose of all chemotherapy with R. CHOP (April 29, 2012) patient received total 6 cycles of chemotherapy and tolerated treatment very well without any significant side effect 6.and underwent high dose chemotherapy and stem cell support High-dose chemotherapy with BEAM regimen.  (March 24: 014  # SEP 13th 2017- ~1.2cm R Ingiunal LN; 48m ex Iliac LN ? Recurrence; RIGHT INGUINAL LN excisional Bx- NEGATIVE [Dr.Byrnett]; JAN 2018- CT NED; sub cm LN  # Port explantation [feb 2019] -------------------   DIAGNOSIS:DLBCL  STAGE:   IV      ;GOALS: cure  CURRENT/MOST RECENT THERAPY: surveillaince    DLBCL (diffuse large B cell lymphoma) ([facility]  10/28/2012 Initial Diagnosis   Malignant lymphoma, large cell, diffuse ([facility])   Diffuse large B-cell lymphoma of spleen ([facility]  09/27/2015 Initial Diagnosis   Diffuse large B-cell lymphoma of spleen ([facility])      INTERVAL HISTORY: Alone.  Ambulating independently.  TCAMIE HAUSS89y.o.  female pleasant patient above history of diffuse large B-cell lymphoma status post autologous stem cell transplant in 2014 is here for follow-up.   Patient has no concerns today.   Intermittent upset  stomach. S/p GI evaluation.   No new lumps or bumps. no weight loss.  Review of Systems  Constitutional:  Negative for chills, diaphoresis, fever, malaise/fatigue and weight loss.  HENT:  Negative for nosebleeds and sore throat.   Eyes:  Negative for double vision.  Respiratory:  Negative for cough, hemoptysis, sputum production, shortness of breath and wheezing.   Cardiovascular:  Negative for chest pain, palpitations, orthopnea and leg swelling.  Gastrointestinal:  Positive for abdominal pain and nausea. Negative for blood in stool, constipation, diarrhea, heartburn, melena and vomiting.  Genitourinary:  Negative for dysuria, frequency and urgency.  Musculoskeletal:  Positive for back pain, joint pain and myalgias.  Skin:  Negative for itching.  Neurological:  Negative for dizziness, tingling, focal weakness, weakness and headaches.  Endo/Heme/Allergies:  Does not bruise/bleed easily.  Psychiatric/Behavioral:  Negative for depression. The patient is not nervous/anxious and does not have insomnia.      PAST MEDICAL HISTORY :  Past Medical History:  Diagnosis Date   Anemia    Anxiety    Chronic kidney disease    kidney stones   Chronic sinusitis    Diffuse large B cell lymphoma ([facility] 2013   Fatigue    GERD (gastroesophageal reflux disease)    Hypertension    past history of hypertension   Hypoalbuminemia    Personal history of chemotherapy June 2013 , and  March 2014   Weight loss     PAST SURGICAL HISTORY :   Past Surgical History:  Procedure Laterality Date   ABDOMINAL HYSTERECTOMY     bladder tacking     COLONOSCOPY  WITH PROPOFOL N/A 10/03/2021   Procedure: COLONOSCOPY WITH PROPOFOL;  Surgeon: Lin Landsman, MD;  Location: [facility] ENDOSCOPY;  Service: Gastroenterology;  Laterality: N/A;   ESOPHAGOGASTRODUODENOSCOPY (EGD) WITH PROPOFOL N/A 10/03/2021   Procedure: ESOPHAGOGASTRODUODENOSCOPY (EGD) WITH PROPOFOL;  Surgeon: Lin Landsman, MD;  Location: [facility]  ENDOSCOPY;  Service: Gastroenterology;  Laterality: N/A;   INGUINAL LYMPH NODE BIOPSY Right 04/21/2016    INGUINAL LYMPH NODE BIOPSY;  REACTIVE LYMPHADENOPATHY.  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   PORTA CATH REMOVAL N/A 09/03/2017   Procedure: PORTA CATH REMOVAL;  Surgeon: Algernon Huxley, MD;  Location: [place] CV LAB;  Service: Cardiovascular;  Laterality: N/A;   PORTACATH PLACEMENT  2013   RECTAL PROLAPSE REPAIR     TUBAL LIGATION      FAMILY HISTORY :   Family History  Adopted: Yes  Family history unknown: Yes    SOCIAL HISTORY:   Social History   Tobacco Use   Smoking status: Former    Packs/day: 1.00    Types: Cigarettes    Quit date: 12/16/1998    Years since quitting: 23.6   Smokeless tobacco: Never  Substance Use Topics   Alcohol use: Yes    Alcohol/week: 4.0 standard drinks of alcohol    Types: 4 Glasses of wine per week   Drug use: No    ALLERGIES:  is allergic to penicillins.  MEDICATIONS:  Current Outpatient Medications  Medication Sig Dispense Refill   albuterol (PROVENTIL HFA;VENTOLIN HFA) 108 (90 Base) MCG/ACT inhaler Inhale 2 puffs into the lungs every 6 (six) hours as needed for wheezing or shortness of breath. 1 Inhaler 2   APPLE CIDER VINEGAR PO Take by mouth. capsules     hydrochlorothiazide (HYDRODIURIL) 25 MG tablet Take 12.5 mg by mouth daily.     lisinopril (ZESTRIL) 10 MG tablet Take 10 mg by mouth daily.     MULTIPLE VITAMIN PO Take by mouth.     neomycin-polymyxin-hydrocortisone (CORTISPORIN) OTIC solution Apply one to two drops to toe after soaking twice daily. 10 mL 0   ondansetron (ZOFRAN ODT) 4 MG disintegrating tablet Take 1 tablet (4 mg total) by mouth every 8 (eight) hours as needed for nausea or vomiting. 15 tablet 0   TURMERIC PO Take by mouth.     azelastine (ASTELIN) 0.1 % nasal spray Place into the nose. (Patient not taking: Reported on 08/08/2022)     No current  facility-administered medications for this visit.   Facility-Administered Medications Ordered in Other Visits  Medication Dose Route Frequency Provider Last Rate Last Admin   heparin lock flush 100 unit/mL  500 Units Intracatheter PRN Cammie Sickle, MD       sodium chloride flush (NS) 0.9 % injection 10 mL  10 mL Intravenous PRN Cammie Sickle, MD       sodium chloride flush (NS) 0.9 % injection 10 mL  10 mL Intracatheter PRN Cammie Sickle, MD        PHYSICAL EXAMINATION: ECOG PERFORMANCE STATUS: 0 - Asymptomatic  BP (!) 122/93   Pulse 83   Temp 98.6 F (37 C)   Resp 19   Wt 223 lb (101.2 kg)   SpO2 98%   BMI 33.91 kg/m   Filed Weights   08/08/22 0954  Weight: 223 lb (101.2 kg)    Physical Exam Constitutional:      Comments: Alone.ambulating independently.   HENT:  Head: Normocephalic and atraumatic.     Mouth/Throat:     Pharynx: No oropharyngeal exudate.  Eyes:     Pupils: Pupils are equal, round, and reactive to light.  Cardiovascular:     Rate and Rhythm: Normal rate and regular rhythm.  Pulmonary:     Effort: Pulmonary effort is normal. No respiratory distress.     Breath sounds: Normal breath sounds. No wheezing.  Abdominal:     General: Bowel sounds are normal. There is no distension.     Palpations: Abdomen is soft. There is no mass.     Tenderness: There is no abdominal tenderness. There is no guarding or rebound.  Musculoskeletal:        General: No tenderness. Normal range of motion.     Cervical back: Normal range of motion and neck supple.  Skin:    General: Skin is warm.  Neurological:     Mental Status: She is alert and oriented to person, place, and time.  Psychiatric:        Mood and Affect: Affect normal.     I have reviewed the data as listed    Component Value Date/Time   NA 138 08/08/2022 0943   NA 141 08/19/2014 1038   K 4.0 08/08/2022 0943   K 4.3 08/19/2014 1038   CL 102 08/08/2022 0943   CL 103  08/19/2014 1038   CO2 27 08/08/2022 0943   CO2 29 08/19/2014 1038   GLUCOSE 140 (H) 08/08/2022 0943   GLUCOSE 103 (H) 08/19/2014 1038   BUN 17 08/08/2022 0943   BUN 9 08/19/2014 1038   CREATININE 1.07 (H) 08/08/2022 0943   CREATININE 1.07 08/19/2014 1038   CALCIUM 8.8 (L) 08/08/2022 0943   CALCIUM 8.6 08/19/2014 1038   PROT 7.3 08/08/2022 0943   PROT 7.0 08/19/2014 1038   ALBUMIN 3.9 08/08/2022 0943   ALBUMIN 3.7 08/19/2014 1038   AST 54 (H) 08/08/2022 0943   AST 18 08/19/2014 1038   ALT 83 (H) 08/08/2022 0943   ALT 36 08/19/2014 1038   ALKPHOS 51 08/08/2022 0943   ALKPHOS 81 08/19/2014 1038   BILITOT 0.6 08/08/2022 0943   BILITOT 0.5 08/19/2014 1038   GFRNONAA >60 08/08/2022 0943   GFRNONAA 59 (L) 08/19/2014 1038   GFRNONAA >60 03/16/2014 0911   GFRAA >60 08/06/2019 1413   GFRAA >60 08/19/2014 1038   GFRAA >60 03/16/2014 0911    No results found for: "SPEP", "UPEP"  Lab Results  Component Value Date   WBC 5.7 08/08/2022   NEUTROABS 3.5 08/08/2022   HGB 13.9 08/08/2022   HCT 40.8 08/08/2022   MCV 95.6 08/08/2022   PLT 236 08/08/2022      Chemistry      Component Value Date/Time   NA 138 08/08/2022 0943   NA 141 08/19/2014 1038   K 4.0 08/08/2022 0943   K 4.3 08/19/2014 1038   CL 102 08/08/2022 0943   CL 103 08/19/2014 1038   CO2 27 08/08/2022 0943   CO2 29 08/19/2014 1038   BUN 17 08/08/2022 0943   BUN 9 08/19/2014 1038   CREATININE 1.07 (H) 08/08/2022 0943   CREATININE 1.07 08/19/2014 1038      Component Value Date/Time   CALCIUM 8.8 (L) 08/08/2022 0943   CALCIUM 8.6 08/19/2014 1038   ALKPHOS 51 08/08/2022 0943   ALKPHOS 81 08/19/2014 1038   AST 54 (H) 08/08/2022 0943   AST 18 08/19/2014 1038   ALT 83 (H) 08/08/2022 5053  ALT 36 08/19/2014 1038   BILITOT 0.6 08/08/2022 0943   BILITOT 0.5 08/19/2014 1038       On: 02/02/2017 11:56  RADIOGRAPHIC STUDIES: I have personally reviewed the radiological images as listed and agreed with the findings  in the report. No results found.   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 Stable.  # Abdominal discomfort-AST ALT elevated -likely fatty liver [once week on average- liquor]-status post EGD colonoscopy [Dr.vanga; 2023] I discussed discussed regarding weight loss/ no alcohol/eating more green leafy vegetables.  Also discussed evaluation with obesity medicine specialist.  # HTN [recent diagnosis- dec 2021]- continue BP medications as prescribed- STABLE.   # DISPOSITION: #  follow up in 12 months-MD/labs- cbc/cmp/LDH-Dr.B   Orders Placed This Encounter  Procedures   CBC with Differential/Platelet    Standing Status:   Future    Standing Expiration Date:   08/08/2023   Comprehensive metabolic panel    Standing Status:   Future    Standing Expiration Date:   08/08/2023   Lactate dehydrogenase    Standing Status:   Future    Standing Expiration Date:   08/09/2023   All questions were answered. The patient knows to call the clinic with any problems, questions or concerns.      Cammie Sickle, MD 08/08/2022 10:59 AM

## 2022-12-07 ENCOUNTER — Ambulatory Visit (INDEPENDENT_AMBULATORY_CARE_PROVIDER_SITE_OTHER): Payer: 59 | Admitting: Dermatology

## 2022-12-07 VITALS — BP 152/93

## 2022-12-07 DIAGNOSIS — Z1283 Encounter for screening for malignant neoplasm of skin: Secondary | ICD-10-CM | POA: Diagnosis not present

## 2022-12-07 DIAGNOSIS — L578 Other skin changes due to chronic exposure to nonionizing radiation: Secondary | ICD-10-CM

## 2022-12-07 DIAGNOSIS — D1801 Hemangioma of skin and subcutaneous tissue: Secondary | ICD-10-CM | POA: Diagnosis not present

## 2022-12-07 DIAGNOSIS — L821 Other seborrheic keratosis: Secondary | ICD-10-CM

## 2022-12-07 DIAGNOSIS — L814 Other melanin hyperpigmentation: Secondary | ICD-10-CM

## 2022-12-07 DIAGNOSIS — X32XXXA Exposure to sunlight, initial encounter: Secondary | ICD-10-CM

## 2022-12-07 DIAGNOSIS — W908XXA Exposure to other nonionizing radiation, initial encounter: Secondary | ICD-10-CM

## 2022-12-07 NOTE — Progress Notes (Signed)
   New Patient Visit   Subjective  Bethany Kelly is a 52 y.o. female who presents for the following: Skin Cancer Screening and Full Body Skin Exam  The patient presents for Total-Body Skin Exam (TBSE) for skin cancer screening and mole check. The patient has spots, moles and lesions to be evaluated, some may be new or changing and the patient has concerns that these could be cancer.    The following portions of the chart were reviewed this encounter and updated as appropriate: medications, allergies, medical history  Review of Systems:  No other skin or systemic complaints except as noted in HPI or Assessment and Plan.  Objective  Well appearing patient in no apparent distress; mood and affect are within normal limits.  A full examination was performed including scalp, head, eyes, ears, nose, lips, neck, chest, axillae, abdomen, back, buttocks, bilateral upper extremities, bilateral lower extremities, hands, feet, fingers, toes, fingernails, and toenails. All findings within normal limits unless otherwise noted below.   Relevant physical exam findings are noted in the Assessment and Plan.    Assessment & Plan   LENTIGINES, SEBORRHEIC KERATOSES, HEMANGIOMAS - Benign normal skin lesions - Benign-appearing - Call for any changes  MELANOCYTIC NEVI - Tan-brown and/or pink-flesh-colored symmetric macules and papules - Benign appearing on exam today - Observation - Call clinic for new or changing moles - Recommend daily use of broad spectrum spf 30+ sunscreen to sun-exposed areas.   ACTINIC DAMAGE - Chronic condition, secondary to cumulative UV/sun exposure - diffuse scaly erythematous macules with underlying dyspigmentation - Recommend daily broad spectrum sunscreen SPF 30+ to sun-exposed areas, reapply every 2 hours as needed.  - Staying in the shade or wearing long sleeves, sun glasses (UVA+UVB protection) and wide brim hats (4-inch brim around the entire circumference of the  hat) are also recommended for sun protection.  - Call for new or changing lesions.  SKIN CANCER SCREENING PERFORMED TODAY.      Return for TBSE 1-3 years with Dr. Roseanne Reno.  Anise Salvo, RMA, am acting as scribe for Darden Dates, MD .   Documentation: I have reviewed the above documentation for accuracy and completeness, and I agree with the above.  Darden Dates, MD

## 2022-12-07 NOTE — Patient Instructions (Signed)
Recommend taking Heliocare sun protection supplement daily in sunny weather for additional sun protection. For maximum protection on the sunniest days, you can take up to 2 capsules of regular Heliocare OR take 1 capsule of Heliocare Ultra. For prolonged exposure (such as a full day in the sun), you can repeat your dose of the supplement 4 hours after your first dose. Heliocare can be purchased at [facility], at some [facility] or at [email or web address].    Melanoma ABCDEs  Melanoma is the most dangerous type of skin cancer, and is the leading cause of death from skin disease.  You are more likely to develop melanoma if you: Have light-colored skin, light-colored eyes, or red or blond hair Spend a lot of time in the sun Tan regularly, either outdoors or in a tanning bed Have had blistering sunburns, especially during childhood Have a close family member who has had a melanoma Have atypical moles or large birthmarks  Early detection of melanoma is key since treatment is typically straightforward and cure rates are extremely high if we catch it early.   The first sign of melanoma is often a change in a mole or a new dark spot.  The ABCDE system is a way of remembering the signs of melanoma.  A for asymmetry:  The two halves do not match. B for border:  The edges of the growth are irregular. C for color:  A mixture of colors are present instead of an even brown color. D for diameter:  Melanomas are usually (but not always) greater than 6mm - the size of a pencil eraser. E for evolution:  The spot keeps changing in size, shape, and color.  Please check your skin once per month between visits. You can use a small mirror in front and a large mirror behind you to keep an eye on the back side or your body.   If you see any new or changing lesions before your next follow-up, please call to schedule a visit.  Please continue daily skin protection including broad spectrum sunscreen SPF 30+ to  sun-exposed areas, reapplying every 2 hours as needed when you're outdoors.    Due to recent changes in healthcare laws, you may see results of your pathology and/or laboratory studies on MyChart before the doctors have had a chance to review them. We understand that in some cases there may be results that are confusing or concerning to you. Please understand that not all results are received at the same time and often the doctors may need to interpret multiple results in order to provide you with the best plan of care or course of treatment. Therefore, we ask that you please give us 2 business days to thoroughly review all your results before contacting the office for clarification. Should we see a critical lab result, you will be contacted sooner.   If You Need Anything After Your Visit  If you have any questions or concerns for your doctor, please call our main line at [phone] and press option 4 to reach your doctor's medical assistant. If no one answers, please leave a voicemail as directed and we will return your call as soon as possible. Messages left after 4 pm will be answered the following business day.   You may also send us a message via MyChart. We typically respond to MyChart messages within 1-2 business days.  For prescription refills, please ask your pharmacy to contact our office. Our fax number is [phone].  If you have   an urgent issue when the clinic is closed that cannot wait until the next business day, you can page your doctor at the number below.    Please note that while we do our best to be available for urgent issues outside of office hours, we are not available 24/7.   If you have an urgent issue and are unable to reach [place], you may choose to seek medical care at your doctor's office, retail clinic, urgent care center, or emergency room.  If you have a medical emergency, please immediately call 911 or go to the emergency department.  Pager Numbers  - Dr.  Gwen Pounds: [phone]  - Dr. Neale Burly: [phone]  - Dr. Roseanne Reno: [phone]  In the event of inclement weather, please call our main line at [phone] for an update on the status of any delays or closures.  Dermatology Medication Tips: Please keep the boxes that topical medications come in in order to help keep track of the instructions about where and how to use these. Pharmacies typically print the medication instructions only on the boxes and not directly on the medication tubes.   If your medication is too expensive, please contact our office at [phone] option 4 or send [place] a message through MyChart.   We are unable to tell what your co-pay for medications will be in advance as this is different depending on your insurance coverage. However, we may be able to find a substitute medication at lower cost or fill out paperwork to get insurance to cover a needed medication.   If a prior authorization is required to get your medication covered by your insurance company, please allow [place] 1-2 business days to complete this process.  Drug prices often vary depending on where the prescription is filled and some pharmacies may offer cheaper prices.  The website www.goodrx.com contains coupons for medications through different pharmacies. The prices here do not account for what the cost may be with help from insurance (it may be cheaper with your insurance), but the website can give you the price if you did not use any insurance.  - You can print the associated coupon and take it with your prescription to the pharmacy.  - You may also stop by our office during regular business hours and pick up a GoodRx coupon card.  - If you need your prescription sent electronically to a different pharmacy, notify our office through [facility] or by phone at [phone] option 4.

## 2023-02-05 ENCOUNTER — Encounter: Payer: Self-pay | Admitting: Emergency Medicine

## 2023-02-05 ENCOUNTER — Other Ambulatory Visit: Payer: Self-pay

## 2023-02-05 DIAGNOSIS — R0789 Other chest pain: Secondary | ICD-10-CM | POA: Diagnosis not present

## 2023-02-05 DIAGNOSIS — E876 Hypokalemia: Secondary | ICD-10-CM | POA: Insufficient documentation

## 2023-02-05 DIAGNOSIS — I10 Essential (primary) hypertension: Secondary | ICD-10-CM | POA: Insufficient documentation

## 2023-02-05 DIAGNOSIS — R519 Headache, unspecified: Secondary | ICD-10-CM | POA: Diagnosis present

## 2023-02-05 LAB — BASIC METABOLIC PANEL
Anion gap: 12 (ref 5–15)
BUN: 32 mg/dL — ABNORMAL HIGH (ref 6–20)
CO2: 25 mmol/L (ref 22–32)
Calcium: 9.9 mg/dL (ref 8.9–10.3)
Chloride: 98 mmol/L (ref 98–111)
Creatinine, Ser: 1.37 mg/dL — ABNORMAL HIGH (ref 0.44–1.00)
GFR, Estimated: 46 mL/min — ABNORMAL LOW (ref >60)
Glucose, Bld: 118 mg/dL — ABNORMAL HIGH (ref 70–99)
Potassium: 3 mmol/L — ABNORMAL LOW (ref 3.5–5.1)
Sodium: 135 mmol/L (ref 135–145)

## 2023-02-05 LAB — CBC
HCT: 40.9 % (ref 36.0–46.0)
Hemoglobin: 14.1 g/dL (ref 12.0–15.0)
MCH: 33.4 pg (ref 26.0–34.0)
MCHC: 34.5 g/dL (ref 30.0–36.0)
MCV: 96.9 fL (ref 80.0–100.0)
Platelets: 203 10*3/uL (ref 150–400)
RBC: 4.22 MIL/uL (ref 3.87–5.11)
RDW: 12.8 % (ref 11.5–15.5)
WBC: 11.3 10*3/uL — ABNORMAL HIGH (ref 4.0–10.5)
nRBC: 0 % (ref 0.0–0.2)

## 2023-02-05 NOTE — ED Triage Notes (Signed)
Pt c/o head pressure and feeling "jittery" throughout her body. Sts hx HTN with elevated BP at home 196/100. No CP/SOB. Denies feelings of palpitations. No dizziness , lightheaded, or photosensitivity.

## 2023-02-06 ENCOUNTER — Emergency Department
Admission: EM | Admit: 2023-02-06 | Discharge: 2023-02-06 | Disposition: A | Discharge status: Home / Self Care | Payer: 59 | Attending: Emergency Medicine | Admitting: Emergency Medicine

## 2023-02-06 DIAGNOSIS — R0789 Other chest pain: Secondary | ICD-10-CM

## 2023-02-06 DIAGNOSIS — E876 Hypokalemia: Secondary | ICD-10-CM

## 2023-02-06 LAB — MAGNESIUM: Magnesium: 1.7 mg/dL (ref 1.7–2.4)

## 2023-02-06 LAB — TROPONIN I (HIGH SENSITIVITY): Troponin I (High Sensitivity): 8 ng/L (ref <18)

## 2023-02-06 LAB — D-DIMER, QUANTITATIVE: D-Dimer, Quant: 0.37 ug/mL-FEU (ref 0.00–0.50)

## 2023-02-06 MED ORDER — POTASSIUM CHLORIDE CRYS ER 20 MEQ PO TBCR
40.0000 meq | EXTENDED_RELEASE_TABLET | Freq: Once | ORAL | Status: AC
  Administered 2023-02-06: 40 meq via ORAL
  Filled 2023-02-06: qty 2

## 2023-02-06 NOTE — ED Provider Notes (Signed)
[facility] Provider Note    Event Date/Time   First MD Initiated Contact with Patient 02/06/23 [phone]     (approximate)   History   Hypertension   HPI  Bethany Kelly is a 52 y.o. female who presents to the ED for evaluation of Hypertension   Patient presents to the ED for the evaluation of feeling jittery and weird.  She is on HCTZ for her blood pressure.  She reports feeling jittery within her chest over the past day.  No pain or dyspnea.  No falls, syncope or sensation/strength changes to upper extremities   Physical Exam   Triage Vital Signs: ED Triage Vitals [02/05/23 2327]  Encounter Vitals Group     BP (!) 143/101     Systolic BP Percentile      Diastolic BP Percentile      Pulse Rate (!) 113     Resp 19     Temp 98.4 F (36.9 C)     Temp Source Oral     SpO2 100 %     Weight 214 lb (97.1 kg)     Height 5\' 8"  (1.727 m)     Head Circumference      Peak Flow      Pain Score 7     Pain Loc      Pain Education      Exclude from Growth Chart     Most recent vital signs: Vitals:   02/05/23 2329 02/06/23 0430  BP:  (!) 142/77  Pulse: (!) 107 79  Resp:  14  Temp:    SpO2:  100%    General: Awake, no distress.  CV:  Good peripheral perfusion.  Resp:  Normal effort.  Abd:  No distention.  MSK:  No deformity noted.  Neuro:  No focal deficits appreciated. Other:     ED Results / Procedures / Treatments   Labs (all labs ordered are listed, but only abnormal results are displayed) Labs Reviewed  CBC - Abnormal; Notable for the following components:      Result Value   WBC 11.3 (*)    All other components within normal limits  BASIC METABOLIC PANEL - Abnormal; Notable for the following components:   Potassium 3.0 (*)    Glucose, Bld 118 (*)    BUN 32 (*)    Creatinine, Ser 1.37 (*)    GFR, Estimated 46 (*)    All other components within normal limits  D-DIMER, QUANTITATIVE  MAGNESIUM  TROPONIN I (HIGH SENSITIVITY)     EKG Sinus rhythm with a rate of 103 bpm.  Normal axis and intervals.  No clear signs of acute ischemia.  RADIOLOGY   Official radiology report(s): No results found.  PROCEDURES and INTERVENTIONS:  .1-3 Lead EKG Interpretation  Performed by: Delton Prairie, MD Authorized by: Delton Prairie, MD     Interpretation: normal     ECG rate:  82   ECG rate assessment: normal     Rhythm: sinus rhythm     Ectopy: none     Conduction: normal     Medications  potassium chloride SA (KLOR-CON M) CR tablet 40 mEq (40 mEq Oral Given 02/06/23 0501)  potassium chloride SA (KLOR-CON M) CR tablet 40 mEq (40 mEq Oral Given 02/06/23 0616)     IMPRESSION / MDM / ASSESSMENT AND PLAN / ED COURSE  I reviewed the triage vital signs and the nursing notes.  Differential diagnosis includes, but is not limited  to, PE, ACS, anxiety, hypokalemia  {Patient presents with symptoms of an acute illness or injury that is potentially life-threatening.  Fairly low risk patient presents with feeling jittery or unusual within her chest.  Possibly due to hypokalemia that we will initiate replacement orally.  We will also send a dimer to screen for PE though this feels less likely.  Will add a magnesium level  D-dimer is negative, troponin is normal as well as magnesium levels.  All could be related to hypokalemia.  This is replaced orally and no acute events while she is observed in the ED.  We discussed PCP follow-up for potassium recheck and symptom recheck.  We discussed appropriate ED return precautions.  Patient is suitable for outpatient management.  Clinical Course as of 02/06/23 0636  Tue Feb 06, 2023  4332 Reassessed discussed reassuring workup.  Heart rate remains normal.  Discussed hypokalemia, expectant management and return precautions. [DS]    Clinical Course User Index [DS] Delton Prairie, MD     FINAL CLINICAL IMPRESSION(S) / ED DIAGNOSES   Final diagnoses:  Hypokalemia  Chest discomfort      Rx / DC Orders   ED Discharge Orders     None        Note:  This document was prepared using Dragon voice recognition software and may include unintentional dictation errors.   Delton Prairie, MD 02/06/23 [phone]

## 2023-03-27 ENCOUNTER — Other Ambulatory Visit: Payer: Self-pay | Admitting: Family Medicine

## 2023-03-27 DIAGNOSIS — Z1231 Encounter for screening mammogram for malignant neoplasm of breast: Secondary | ICD-10-CM

## 2023-04-24 ENCOUNTER — Ambulatory Visit
Admission: RE | Admit: 2023-04-24 | Discharge: 2023-04-24 | Disposition: A | Discharge status: Home / Self Care | Payer: 59 | Source: Ambulatory Visit | Attending: Family Medicine | Admitting: Family Medicine

## 2023-04-24 DIAGNOSIS — Z1231 Encounter for screening mammogram for malignant neoplasm of breast: Secondary | ICD-10-CM | POA: Diagnosis present

## 2023-06-11 ENCOUNTER — Ambulatory Visit
Admission: EM | Admit: 2023-06-11 | Discharge: 2023-06-11 | Disposition: A | Discharge status: Home / Self Care | Payer: 59 | Attending: Physician Assistant | Admitting: Physician Assistant

## 2023-06-11 DIAGNOSIS — F419 Anxiety disorder, unspecified: Secondary | ICD-10-CM | POA: Diagnosis not present

## 2023-06-11 DIAGNOSIS — J329 Chronic sinusitis, unspecified: Secondary | ICD-10-CM | POA: Diagnosis not present

## 2023-06-11 DIAGNOSIS — R519 Headache, unspecified: Secondary | ICD-10-CM

## 2023-06-11 MED ORDER — PSEUDOEPHEDRINE-GUAIFENESIN ER 60-600 MG PO TB12: 1.0000 | ORAL_TABLET | Freq: Two times a day (BID) | ORAL | 0 refills | Status: AC

## 2023-06-11 MED ORDER — PREDNISONE 20 MG PO TABS: 40.0000 mg | ORAL_TABLET | Freq: Every day | ORAL | 0 refills | Status: AC

## 2023-06-11 MED ORDER — IPRATROPIUM BROMIDE 0.06 % NA SOLN: 2.0000 | Freq: Four times a day (QID) | NASAL | 0 refills | Status: AC

## 2023-06-11 NOTE — ED Triage Notes (Signed)
Pt c/o head pressure x5 mon states has been getting worse. Also c/o shakiness that started today. Denies any hx of anxiety.

## 2023-06-11 NOTE — ED Provider Notes (Signed)
[facility]    CSN: [id number] Arrival date & time: 06/11/23  1339      History   Chief Complaint Chief Complaint  Patient presents with   Headache   Shaking    HPI Bethany Kelly is a 52 y.o. female.   Patient reports intermittent headaches x 5 months.  Today her "head pressure" is worse.  She says it is not pain and 10/10 pressure in her face and frontal region.  Also reports fullness, and and "feeling hazy." No visual disturbances. No nausea/vomiting.  No report of dizziness denies neck pain, chest pain, racing heart, SOB. Reports entire body shakiness.   Patient has history of chronic sinusitis. States sinuses have been stopped up for months.  She think she might have a sinus infection causing her symptoms.  Has tried decongestants without relief. No tylenol/ Motrin taken because it is "not pain."  Patient's medical history significant for anxiety, chronic sinusitis, hypertension and lymphoma of spleen.  No history of heart attack, stroke, aneurysm.  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   Chronic sinusitis    Patellofemoral stress syndrome 04/13/2014   Lateral epicondylitis of elbow 04/02/2014   GERD (gastroesophageal reflux disease) 10/29/2012   Hypertension 10/28/2012   DLBCL (diffuse large B cell lymphoma) (HCC) 10/28/2012   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    Past Surgical History:  Procedure Laterality Date   ABDOMINAL HYSTERECTOMY     bladder tacking     COLONOSCOPY WITH PROPOFOL N/A 10/03/2021   Procedure: COLONOSCOPY WITH PROPOFOL;  Surgeon: Toney Reil, MD;  Location: [facility] ENDOSCOPY;  Service: Gastroenterology;  Laterality: N/A;   ESOPHAGOGASTRODUODENOSCOPY (EGD) WITH PROPOFOL N/A 10/03/2021   Procedure: ESOPHAGOGASTRODUODENOSCOPY (EGD) WITH PROPOFOL;  Surgeon: Toney Reil, MD;  Location: [facility] ENDOSCOPY;  Service: Gastroenterology;  Laterality: N/A;   INGUINAL LYMPH NODE BIOPSY Right 04/21/2016    INGUINAL LYMPH NODE BIOPSY;  REACTIVE LYMPHADENOPATHY.  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 Authorizing Provider  albuterol (PROVENTIL HFA;VENTOLIN HFA) 108 (90 Base) MCG/ACT inhaler Inhale 2 puffs into the lungs every 6 (six) hours as needed for wheezing or shortness of breath. 10/09/18  Yes Earna Coder, MD  APPLE CIDER VINEGAR PO Take by mouth. capsules   Yes [provider]  azelastine (ASTELIN) 0.1 % nasal spray Place into the nose. 08/15/21  Yes [provider]  hydrochlorothiazide (HYDRODIURIL) 25 MG tablet Take 12.5 mg by mouth daily.   Yes [provider]   ipratropium (ATROVENT) 0.06 % nasal spray Place 2 sprays into both nostrils 4 (four) times daily. 06/11/23  Yes Eusebio Friendly B, PA-C  lisinopril (ZESTRIL) 10 MG tablet Take 10 mg by mouth daily.   Yes [provider]  MULTIPLE VITAMIN PO Take by mouth.   Yes [provider]  predniSONE (DELTASONE) 20 MG tablet Take 2 tablets (40 mg total) by mouth daily for 5 days. 06/11/23 06/16/23 Yes Shirlee Latch, PA-C  pseudoephedrine-guaifenesin (MUCINEX D) 60-600 MG 12 hr tablet Take 1 tablet by mouth every 12 (twelve) hours for 7 days. 06/11/23 06/18/23 Yes Eusebio Friendly B, PA-C  TURMERIC PO Take by mouth.   Yes [provider]  neomycin-polymyxin-hydrocortisone (CORTISPORIN) OTIC solution Apply one to two drops to toe after soaking twice daily. 04/12/22   Hyatt, Max T, DPM  ondansetron (ZOFRAN ODT) 4 MG disintegrating tablet Take 1 tablet (4 mg total) by mouth every 8 (eight) hours as needed for nausea or vomiting. 08/30/20   Irean Hong, MD    Family History Family History  Adopted: Yes  Problem Relation Age of Onset   Breast cancer Neg Hx     Social History Social History   Tobacco Use   Smoking status: Former    Current packs/day: 0.00    Types: Cigarettes    Quit date: 12/16/1998    Years since quitting: 24.5   Smokeless tobacco: Never  Substance Use Topics   Alcohol use: Yes    Alcohol/week: 4.0 standard drinks of alcohol    Types: 4 Glasses of wine per week   Drug use: No     Allergies   Penicillins   Review of Systems Review of Systems  Constitutional:  Negative for fatigue and fever.  HENT:  Positive for congestion and sinus pressure. Negative for ear pain, rhinorrhea, sinus pain and sore throat.   Eyes:  Negative for visual disturbance.  Respiratory:  Negative for cough and shortness of breath.   Cardiovascular:  Negative for chest pain and palpitations.  Gastrointestinal:  Negative for nausea and vomiting.  Musculoskeletal:  Negative for  neck pain and neck stiffness.  Neurological:  Positive for headaches. Negative for dizziness, syncope, weakness, light-headedness and numbness.     Physical Exam Triage Vital Signs ED Triage Vitals  Encounter Vitals Group     BP 06/11/23 1349 (!) 160/106     Systolic BP Percentile --      Diastolic BP Percentile --      Pulse Rate 06/11/23 1349 95     Resp 06/11/23 1349 16     Temp 06/11/23 1349 98.3 F (36.8 C)  Temp Source 06/11/23 1349 Oral     SpO2 06/11/23 1349 94 %     Weight 06/11/23 1349 197 lb (89.4 kg)     Height 06/11/23 1349 5\' 8"  (1.727 m)     Head Circumference --      Peak Flow --      Pain Score 06/11/23 1357 10     Pain Loc --      Pain Education --      Exclude from Growth Chart --    No data found.  Updated Vital Signs BP (!) 160/106 (BP Location: Right Arm)   Pulse 95   Temp 98.3 F (36.8 C) (Oral)   Resp 16   Ht 5\' 8"  (1.727 m)   Wt 197 lb (89.4 kg)   SpO2 94%   BMI 29.95 kg/m      Physical Exam Vitals and nursing note reviewed.  Constitutional:      General: She is not in acute distress.    Appearance: Normal appearance. She is not ill-appearing or toxic-appearing.  HENT:     Head: Normocephalic and atraumatic.     Right Ear: A middle ear effusion is present.     Left Ear: A middle ear effusion is present.     Nose: Mucosal edema and congestion present.     Mouth/Throat:     Mouth: Mucous membranes are moist.     Pharynx: Oropharynx is clear.  Eyes:     General: No scleral icterus.       Right eye: No discharge.        Left eye: No discharge.     Extraocular Movements: Extraocular movements intact.     Conjunctiva/sclera: Conjunctivae normal.     Pupils: Pupils are equal, round, and reactive to light.  Cardiovascular:     Rate and Rhythm: Normal rate and regular rhythm.     Heart sounds: Normal heart sounds.  Pulmonary:     Effort: Pulmonary effort is normal. No respiratory distress.     Breath sounds: Normal breath sounds.   Musculoskeletal:     Cervical back: Neck supple.  Skin:    General: Skin is dry.  Neurological:     General: No focal deficit present.     Mental Status: She is alert and oriented to person, place, and time. Mental status is at baseline.     Cranial Nerves: No cranial nerve deficit.     Motor: No weakness.     Coordination: Coordination normal.     Gait: Gait normal.  Psychiatric:        Mood and Affect: Mood is anxious.      UC Treatments / Results  Labs (all labs ordered are listed, but only abnormal results are displayed) Labs Reviewed - No data to display  EKG   Radiology No results found.  Procedures Procedures (including critical care time)  Medications Ordered in UC Medications - No data to display  Initial Impression / Assessment and Plan / UC Course  I have reviewed the triage vital signs and the nursing notes.  Pertinent labs & imaging results that were available during my care of the patient were reviewed by me and considered in my medical decision making (see chart for details).   52 year old female presents for 67-month intermittent history of facial pressure and headaches with nasal congestion.  Symptoms are worse today.  Denies fever, dizziness, nasal drainage, neck pain, nausea/vomiting, chest pain, palpitations, shortness of breath.  Reports that she feels shaky  and "easy."  Medical history significant for chronic sinusitis, anxiety, hypertension, lymphoma of spleen.  Has tried decongestants occasionally it has not helped.  Blood pressure is 160/106 but she is visibly anxious.  Does not always maintain eye contact.  Voice is shaky and she is a little shaky in the hands.  She has clear effusion of bilateral TMs, nasal congestion.  Throat is clear.  PERRLA.  Normal cranial nerve exam.  Normal gait.  Chest clear.  Heart regular rate and rhythm.  Offered ketorolac injection for her headache but she reports that it is not a headache and just pressure so she  does not think it will help.  Discussed that her symptoms are likely due to chronic sinus problems.  I do not believe she has acute bacterial sinusitis so I do not believe an antibiotic would help her.  Explained that we could try prednisone to see if that would help.  She is unsure if she wants to take it but I did prescribe it so that she has it as an option.  Also sent Mucinex D and Atrovent nasal spray.  Encouraged use of Tylenol and Motrin for headache.  Encouraged increased rest and fluids.  Advised her to follow-up with her PCP especially if symptoms continue.  Reviewed ED precautions.   Final Clinical Impressions(s) / UC Diagnoses   Final diagnoses:  Sinus headache  Chronic sinusitis, unspecified location  Anxiety     Discharge Instructions      -Symptoms likely related to chronic sinus problems.  I sent medications to the pharmacy including Mucinex D and Atrovent nasal spray.  Also sent prednisone. - Try ibuprofen and/or Tylenol for your headache.  Increase rest and fluids. -Follow up with PCP especially if symptoms continue will follow-up with ENT specialist. -Go to ER if worsening symptoms.  Go to ER if worsening headache, vision changes, vomiting, extreme dizziness, chest pain, racing heart, shortness of breath or worsening of your anxiety.     ED Prescriptions     Medication Sig Dispense Auth. Provider   predniSONE (DELTASONE) 20 MG tablet Take 2 tablets (40 mg total) by mouth daily for 5 days. 10 tablet Eusebio Friendly B, PA-C   ipratropium (ATROVENT) 0.06 % nasal spray Place 2 sprays into both nostrils 4 (four) times daily. 15 mL Eusebio Friendly B, PA-C   pseudoephedrine-guaifenesin (MUCINEX D) 60-600 MG 12 hr tablet Take 1 tablet by mouth every 12 (twelve) hours for 7 days. 14 tablet Gareth Morgan      PDMP not reviewed this encounter.   Shirlee Latch, PA-C 06/11/23 1424

## 2023-06-11 NOTE — Discharge Instructions (Addendum)
-  Symptoms likely related to chronic sinus problems.  I sent medications to the pharmacy including Mucinex D and Atrovent nasal spray.  Also sent prednisone. - Try ibuprofen and/or Tylenol for your headache.  Increase rest and fluids. -Follow up with PCP especially if symptoms continue will follow-up with ENT specialist. -Go to ER if worsening symptoms.  Go to ER if worsening headache, vision changes, vomiting, extreme dizziness, chest pain, racing heart, shortness of breath or worsening of your anxiety.

## 2023-08-09 ENCOUNTER — Other Ambulatory Visit: Payer: 59

## 2023-08-09 ENCOUNTER — Ambulatory Visit: Payer: 59 | Admitting: Internal Medicine

## 2023-08-11 ENCOUNTER — Emergency Department: Payer: 59

## 2023-08-11 ENCOUNTER — Emergency Department
Admission: EM | Admit: 2023-08-11 | Discharge: 2023-08-11 | Disposition: A | Discharge status: Home / Self Care | Payer: 59 | Attending: Student in an Organized Health Care Education/Training Program | Admitting: Student in an Organized Health Care Education/Training Program

## 2023-08-11 ENCOUNTER — Other Ambulatory Visit: Payer: Self-pay

## 2023-08-11 ENCOUNTER — Encounter: Payer: Self-pay | Admitting: Emergency Medicine

## 2023-08-11 DIAGNOSIS — M542 Cervicalgia: Secondary | ICD-10-CM | POA: Insufficient documentation

## 2023-08-11 DIAGNOSIS — M7918 Myalgia, other site: Secondary | ICD-10-CM

## 2023-08-11 LAB — BASIC METABOLIC PANEL
Anion gap: 9 (ref 5–15)
BUN: 21 mg/dL — ABNORMAL HIGH (ref 6–20)
CO2: 26 mmol/L (ref 22–32)
Calcium: 9 mg/dL (ref 8.9–10.3)
Chloride: 99 mmol/L (ref 98–111)
Creatinine, Ser: 1.1 mg/dL — ABNORMAL HIGH (ref 0.44–1.00)
GFR, Estimated: 60 mL/min — ABNORMAL LOW (ref >60)
Glucose, Bld: 124 mg/dL — ABNORMAL HIGH (ref 70–99)
Potassium: 3.6 mmol/L (ref 3.5–5.1)
Sodium: 134 mmol/L — ABNORMAL LOW (ref 135–145)

## 2023-08-11 LAB — CBC WITH DIFFERENTIAL/PLATELET
Abs Immature Granulocytes: 0.02 10*3/uL (ref 0.00–0.07)
Basophils Absolute: 0 10*3/uL (ref 0.0–0.1)
Basophils Relative: 0 %
Eosinophils Absolute: 0 10*3/uL (ref 0.0–0.5)
Eosinophils Relative: 0 %
HCT: 42.6 % (ref 36.0–46.0)
Hemoglobin: 14.7 g/dL (ref 12.0–15.0)
Immature Granulocytes: 0 %
Lymphocytes Relative: 16 %
Lymphs Abs: 1.2 10*3/uL (ref 0.7–4.0)
MCH: 33.6 pg (ref 26.0–34.0)
MCHC: 34.5 g/dL (ref 30.0–36.0)
MCV: 97.5 fL (ref 80.0–100.0)
Monocytes Absolute: 0.4 10*3/uL (ref 0.1–1.0)
Monocytes Relative: 5 %
Neutro Abs: 5.8 10*3/uL (ref 1.7–7.7)
Neutrophils Relative %: 79 %
Platelets: 174 10*3/uL (ref 150–400)
RBC: 4.37 MIL/uL (ref 3.87–5.11)
RDW: 12.2 % (ref 11.5–15.5)
WBC: 7.5 10*3/uL (ref 4.0–10.5)
nRBC: 0 % (ref 0.0–0.2)

## 2023-08-11 MED ORDER — DICLOFENAC SODIUM 1 % EX GEL: 2.0000 g | Freq: Four times a day (QID) | CUTANEOUS | 0 refills | Status: DC

## 2023-08-11 NOTE — ED Provider Notes (Signed)
[facility] Provider Note    Event Date/Time   First MD Initiated Contact with Patient 08/11/23 [phone]     (approximate)   History   Neck Pain   HPI  Bethany Kelly is a 53 y.o. female presents to the ED with complaint of a pinching sensation to the anterior portion of her neck without history of injury.  Patient was seen by her PCP and lab work was reported as normal with exception of her RBC and PCP is doing another blood test to see if she is vitamin D deficient.  Patient denies any fever, chills, nausea or vomiting.  She has no difficulty talking or swallowing.  No over-the-counter medication has been taken.     Physical Exam   Triage Vital Signs: ED Triage Vitals  Encounter Vitals Group     BP 08/11/23 0617 (!) 169/104     Systolic BP Percentile --      Diastolic BP Percentile --      Pulse Rate 08/11/23 0617 99     Resp 08/11/23 0617 17     Temp 08/11/23 0617 98.3 F (36.8 C)     Temp Source 08/11/23 0617 Oral     SpO2 08/11/23 0617 96 %     Weight 08/11/23 0619 202 lb (91.6 kg)     Height 08/11/23 0619 5\' 8"  (1.727 m)     Head Circumference --      Peak Flow --      Pain Score 08/11/23 0618 6     Pain Loc --      Pain Education --      Exclude from Growth Chart --     Most recent vital signs: Vitals:   08/11/23 0906 08/11/23 0920  BP:  (!) 138/92  Pulse:  88  Resp:  16  Temp: 98.1 F (36.7 C)   SpO2:  96%     General: Awake, no distress.  Alert.  Talkative, able to speak in complete sentences without any difficulty and is maintaining secretions and swallowing saliva. CV:  Good peripheral perfusion.  Heart regular rate rhythm. Resp:  Normal effort.  Lungs are clear bilaterally. Abd:  No distention.  Other:  On examination the there is no gross deformity.  No point tenderness on palpation posteriorly.  There is no rash or discoloration noted to the anterior neck.  No soft tissue edema.  Nontender palpation.  Patient reports a  pinching sensation to the right lateral anterior cervical area which on exam is benign.   ED Results / Procedures / Treatments   Labs (all labs ordered are listed, but only abnormal results are displayed) Labs Reviewed  BASIC METABOLIC PANEL - Abnormal; Notable for the following components:      Result Value   Sodium 134 (*)    Glucose, Bld 124 (*)    BUN 21 (*)    Creatinine, Ser 1.10 (*)    GFR, Estimated 60 (*)    All other components within normal limits  CBC WITH DIFFERENTIAL/PLATELET     RADIOLOGY Cervical spine x-ray images were reviewed by myself independent of the radiologist with some mild degenerative changes noted anteriorly.    PROCEDURES:  Critical Care performed:   Procedures   MEDICATIONS ORDERED IN ED: Medications - No data to display   IMPRESSION / MDM / ASSESSMENT AND PLAN / ED COURSE  I reviewed the triage vital signs and the nursing notes.   Differential diagnosis includes, but is not  limited to, cervical strain, muscle skeletal pain, cervical subluxation, compression fracture.  53 year old female presents to the ED with complaint of right anterior cervical pain that started approximately 3 to 4 days.  She was seen at her PCPs office and lab work was done.  She also was told that a referral would be made to ENT for further evaluation.  Lab work was reassuring and patient was made aware.  Also we discussed the mild cervical degeneration noted on her x-ray.  I do not feel that oral medication is needed at this time.  She agrees to try some Voltaren gel and to follow-up with her PCP.      Patient's presentation is most consistent with acute complicated illness / injury requiring diagnostic workup.  FINAL CLINICAL IMPRESSION(S) / ED DIAGNOSES   Final diagnoses:  Musculoskeletal pain     Rx / DC Orders   ED Discharge Orders          Ordered    diclofenac Sodium (VOLTAREN ARTHRITIS PAIN) 1 % GEL  4 times daily        08/11/23 0948              Note:  This document was prepared using Dragon voice recognition software and may include unintentional dictation errors.   Tommi Rumps, PA-C 08/11/23 1003    Willy Eddy, MD 08/11/23 [phone]

## 2023-08-11 NOTE — ED Triage Notes (Signed)
Pt in via POV, reports tightness and pinching to right frontal neck area x a few days.  Denies any recent injury.  Denies any trouble swallowing.  Ambulatory to triage, NAD noted at this time.

## 2023-08-11 NOTE — Discharge Instructions (Signed)
Call make a follow-up appoint with Dr. Marvis Moeller. A prescription for diclofenac arthritis gel was sent to the pharmacy for you to begin using 4 times a day as needed.

## 2023-08-24 ENCOUNTER — Encounter: Payer: Self-pay | Admitting: Internal Medicine

## 2023-08-24 ENCOUNTER — Inpatient Hospital Stay: Payer: 59 | Attending: Internal Medicine

## 2023-08-24 ENCOUNTER — Other Ambulatory Visit: Payer: Self-pay

## 2023-08-24 ENCOUNTER — Inpatient Hospital Stay (HOSPITAL_BASED_OUTPATIENT_CLINIC_OR_DEPARTMENT_OTHER): Payer: 59 | Admitting: Internal Medicine

## 2023-08-24 DIAGNOSIS — Z9221 Personal history of antineoplastic chemotherapy: Secondary | ICD-10-CM | POA: Insufficient documentation

## 2023-08-24 DIAGNOSIS — Z8572 Personal history of non-Hodgkin lymphomas: Secondary | ICD-10-CM | POA: Insufficient documentation

## 2023-08-24 DIAGNOSIS — C8337 Diffuse large B-cell lymphoma, spleen: Secondary | ICD-10-CM

## 2023-08-24 DIAGNOSIS — I129 Hypertensive chronic kidney disease with stage 1 through stage 4 chronic kidney disease, or unspecified chronic kidney disease: Secondary | ICD-10-CM | POA: Insufficient documentation

## 2023-08-24 DIAGNOSIS — Z9484 Stem cells transplant status: Secondary | ICD-10-CM | POA: Insufficient documentation

## 2023-08-24 DIAGNOSIS — N189 Chronic kidney disease, unspecified: Secondary | ICD-10-CM | POA: Diagnosis not present

## 2023-08-24 DIAGNOSIS — Z87891 Personal history of nicotine dependence: Secondary | ICD-10-CM | POA: Diagnosis not present

## 2023-08-24 DIAGNOSIS — Z79899 Other long term (current) drug therapy: Secondary | ICD-10-CM | POA: Diagnosis not present

## 2023-08-24 LAB — CBC WITH DIFFERENTIAL (CANCER CENTER ONLY)
Abs Immature Granulocytes: 0.02 10*3/uL (ref 0.00–0.07)
Basophils Absolute: 0 10*3/uL (ref 0.0–0.1)
Basophils Relative: 1 %
Eosinophils Absolute: 0.1 10*3/uL (ref 0.0–0.5)
Eosinophils Relative: 2 %
HCT: 39.7 % (ref 36.0–46.0)
Hemoglobin: 13.4 g/dL (ref 12.0–15.0)
Immature Granulocytes: 0 %
Lymphocytes Relative: 22 %
Lymphs Abs: 1.5 10*3/uL (ref 0.7–4.0)
MCH: 33.3 pg (ref 26.0–34.0)
MCHC: 33.8 g/dL (ref 30.0–36.0)
MCV: 98.8 fL (ref 80.0–100.0)
Monocytes Absolute: 0.4 10*3/uL (ref 0.1–1.0)
Monocytes Relative: 6 %
Neutro Abs: 5 10*3/uL (ref 1.7–7.7)
Neutrophils Relative %: 69 %
Platelet Count: 210 10*3/uL (ref 150–400)
RBC: 4.02 MIL/uL (ref 3.87–5.11)
RDW: 12.3 % (ref 11.5–15.5)
WBC Count: 7.1 10*3/uL (ref 4.0–10.5)
nRBC: 0 % (ref 0.0–0.2)

## 2023-08-24 LAB — CMP (CANCER CENTER ONLY)
ALT: 77 U/L — ABNORMAL HIGH (ref 0–44)
AST: 74 U/L — ABNORMAL HIGH (ref 15–41)
Albumin: 3.9 g/dL (ref 3.5–5.0)
Alkaline Phosphatase: 49 U/L (ref 38–126)
Anion gap: 9 (ref 5–15)
BUN: 25 mg/dL — ABNORMAL HIGH (ref 6–20)
CO2: 27 mmol/L (ref 22–32)
Calcium: 9.2 mg/dL (ref 8.9–10.3)
Chloride: 102 mmol/L (ref 98–111)
Creatinine: 0.86 mg/dL (ref 0.44–1.00)
GFR, Estimated: >60 mL/min
Glucose, Bld: 91 mg/dL (ref 70–99)
Potassium: 3.9 mmol/L (ref 3.5–5.1)
Sodium: 138 mmol/L (ref 135–145)
Total Bilirubin: 0.7 mg/dL (ref 0.0–1.2)
Total Protein: 7.2 g/dL (ref 6.5–8.1)

## 2023-08-24 LAB — LACTATE DEHYDROGENASE: LDH: 196 U/L — ABNORMAL HIGH (ref 98–192)

## 2023-08-24 NOTE — Progress Notes (Signed)
10-11 years out from Transplant. Does admit to some strange symptoms, internal shaking, tightness in throat, pressure in head. She has sought out MD input and they had no answers to her symptoms. Appetite is normal. No recent colds or viruses. No B symptoms.

## 2023-08-24 NOTE — Assessment & Plan Note (Addendum)
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 Stable.  However LDH slightly elevated-will repeat again in 3 months.  If continues to be elevated would recommend further workup.  # Vague symptoms - of "internal shaking, tightness in throat, pressure in head"-status post cardiology evaluation for palpitations.  Unclear etiology patient denies any anxiety or seizure-like activity.  Discussed regarding evaluation with psychiatry or neurology.  Patient declines.  Defer to PCP  # Abdominal discomfort-AST ALT elevated -likely fatty liver [once week on average- liquor]-status post EGD colonoscopy [Dr.vanga; 2023] I discussed discussed regarding weight loss/ no alcohol/eating more green leafy vegetables.  Currently working with trainer.  # HTN [recent diagnosis- dec 2021]- continue BP medications as prescribed-  stable.   # DISPOSITION: # /LDH in 3 months #  follow up in 12 months-MD/labs- cbc/cmp/LDH-Dr.B

## 2023-08-24 NOTE — Progress Notes (Signed)
[facility] OFFICE PROGRESS NOTE  Patient Care Team: Leanna Sato, MD as PCP - General (Family Medicine) Earna Coder, MD as Consulting Physician (Internal Medicine) Lemar Livings Merrily Pew, MD (General Surgery)   Cancer Staging  No matching staging information was found for the patient.    Oncology History Overview Note  Chief Complaint/Diagnosis:   # 2013-  admitted with hypercalcemia and abnormal CT scan of the abdomen  2. 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 Patient was started on RCHOP chemotherapy June of 2013 4. High-dose methotrexate in alternate cycles starting from July 29. 5.last dose of all chemotherapy with R. CHOP (April 29, 2012) patient received total 6 cycles of chemotherapy and tolerated treatment very well without any significant side effect 6.and underwent high dose chemotherapy and stem cell support High-dose chemotherapy with BEAM regimen.  (March 24: 014  # SEP 13th 2017- ~1.2cm R Ingiunal LN; 6mm ex Iliac LN ? 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  Ambulating independently.  Bethany Kelly 53 y.o.  female pleasant patient above history of diffuse large B-cell lymphoma status post autologous stem cell transplant in 2014 is here for follow-up.   Does admit to some strange symptoms, internal shaking,  tightness in throat, pressure in head. Episodic some times all day.  Denies any history of seizures.  Appetite is normal. No recent colds or viruses. No B symptom   No new lumps or bumps. no weight loss.  Review of Systems  Constitutional:  Negative for chills, diaphoresis, fever, malaise/fatigue and weight loss.  HENT:  Negative for nosebleeds and sore throat.   Eyes:  Negative for double vision.  Respiratory:  Negative for cough, hemoptysis, sputum production, shortness of breath and wheezing.   Cardiovascular:  Negative for chest pain, palpitations, orthopnea and leg swelling.  Gastrointestinal:  Positive for abdominal pain. Negative for blood in stool, constipation, diarrhea, heartburn, melena and vomiting.  Genitourinary:  Negative for dysuria, frequency and urgency.  Musculoskeletal:  Positive for back pain and joint pain.  Skin:  Negative for itching.  Neurological:  Negative for dizziness, tingling, focal weakness, weakness and headaches.  Endo/Heme/Allergies:  Does not bruise/bleed easily.  Psychiatric/Behavioral:  Negative for depression. The patient is not nervous/anxious and does not have insomnia.      PAST MEDICAL HISTORY :  Past Medical History:  Diagnosis Date   Anemia    Anxiety    Chronic kidney disease    kidney stones   Chronic sinusitis    Diffuse large B cell lymphoma (HCC) 2013   Fatigue    GERD (gastroesophageal reflux disease)    Hypertension    past history of hypertension   Hypoalbuminemia    Personal history of chemotherapy June 2013 , and  March 2014   Weight loss     PAST SURGICAL HISTORY :  Past Surgical History:  Procedure Laterality Date   ABDOMINAL HYSTERECTOMY     bladder tacking     COLONOSCOPY WITH PROPOFOL N/A 10/03/2021   Procedure: COLONOSCOPY WITH PROPOFOL;  Surgeon: Toney Reil, MD;  Location: [facility] ENDOSCOPY;  Service: Gastroenterology;  Laterality: N/A;   ESOPHAGOGASTRODUODENOSCOPY (EGD) WITH PROPOFOL N/A 10/03/2021    Procedure: ESOPHAGOGASTRODUODENOSCOPY (EGD) WITH PROPOFOL;  Surgeon: Toney Reil, MD;  Location: [facility] ENDOSCOPY;  Service: Gastroenterology;  Laterality: N/A;   INGUINAL LYMPH NODE BIOPSY Right 04/21/2016    INGUINAL LYMPH NODE BIOPSY;  REACTIVE LYMPHADENOPATHY.  Earline Mayotte, MD;  [facility] ORS;General;  Laterality: Right;   LIMBAL STEM CELL TRANSPLANT  12/2012   PORTA CATH REMOVAL N/A 09/03/2017   Procedure: PORTA CATH REMOVAL;  Surgeon: Annice Needy, MD;  Location: [facility] INVASIVE CV LAB;  Service: Cardiovascular;  Laterality: N/A;   PORTACATH PLACEMENT  2013   RECTAL PROLAPSE REPAIR     TUBAL LIGATION      FAMILY HISTORY :   Family History  Adopted: Yes  Problem Relation Age of Onset   Breast cancer Neg Hx     SOCIAL HISTORY:   Social History   Tobacco Use   Smoking status: Former    Current packs/day: 0.00    Types: Cigarettes    Quit date: 12/16/1998    Years since quitting: 24.7   Smokeless tobacco: Never  Vaping Use   Vaping status: Never Used  Substance Use Topics   Alcohol use: Yes    Alcohol/week: 4.0 standard drinks of alcohol    Types: 4 Glasses of wine per week   Drug use: No    ALLERGIES:  is allergic to penicillins.  MEDICATIONS:  Current Outpatient Medications  Medication Sig Dispense Refill   albuterol (PROVENTIL HFA;VENTOLIN HFA) 108 (90 Base) MCG/ACT inhaler Inhale 2 puffs into the lungs every 6 (six) hours as needed for wheezing or shortness of breath. 1 Inhaler 2   APPLE CIDER VINEGAR PO Take by mouth. capsules     azelastine (ASTELIN) 0.1 % nasal spray Place into the nose.     hydrochlorothiazide (HYDRODIURIL) 25 MG tablet Take 12.5 mg by mouth daily.     ipratropium (ATROVENT) 0.06 % nasal spray Place 2 sprays into both nostrils 4 (four) times daily. 15 mL 0   lisinopril (ZESTRIL) 10 MG tablet Take 10 mg by mouth daily.     MULTIPLE VITAMIN PO Take by mouth.     neomycin-polymyxin-hydrocortisone (CORTISPORIN) OTIC solution Apply one to two  drops to toe after soaking twice daily. 10 mL 0   TURMERIC PO Take by mouth.     No current facility-administered medications for this visit.   Facility-Administered Medications Ordered in Other Visits  Medication Dose Route Frequency Provider Last Rate Last Admin   heparin lock flush 100 unit/mL  500 Units Intracatheter PRN Earna Coder, MD       sodium chloride flush (NS) 0.9 % injection 10 mL  10 mL Intravenous PRN Earna Coder, MD       sodium chloride flush (NS) 0.9 % injection 10 mL  10 mL Intracatheter PRN Earna Coder, MD        PHYSICAL EXAMINATION: ECOG PERFORMANCE STATUS: 0 - Asymptomatic  BP 123/77 (BP Location: Left Arm, Patient Position: Sitting)   Pulse 83   Temp 98 F (36.7 C) (Tympanic)   Resp 18   Wt 206 lb 3.2 oz (93.5 kg)   SpO2 100%   BMI 31.35  kg/m   Filed Weights   08/24/23 1031  Weight: 206 lb 3.2 oz (93.5 kg)    Physical Exam Constitutional:      Comments: Alone.ambulating independently.   HENT:     Head: Normocephalic and atraumatic.     Mouth/Throat:     Pharynx: No oropharyngeal exudate.  Eyes:     Pupils: Pupils are equal, round, and reactive to light.  Cardiovascular:     Rate and Rhythm: Normal rate and regular rhythm.  Pulmonary:     Effort: Pulmonary effort is normal. No respiratory distress.     Breath sounds: Normal breath sounds. No wheezing.  Abdominal:     General: Bowel sounds are normal. There is no distension.     Palpations: Abdomen is soft. There is no mass.     Tenderness: There is no abdominal tenderness. There is no guarding or rebound.  Musculoskeletal:        General: No tenderness. Normal range of motion.     Cervical back: Normal range of motion and neck supple.  Skin:    General: Skin is warm.  Neurological:     Mental Status: She is alert and oriented to person, place, and time.  Psychiatric:        Mood and Affect: Affect normal.     I have reviewed the data as listed     Component Value Date/Time   NA 138 08/24/2023 1004   NA 141 08/19/2014 1038   K 3.9 08/24/2023 1004   K 4.3 08/19/2014 1038   CL 102 08/24/2023 1004   CL 103 08/19/2014 1038   CO2 27 08/24/2023 1004   CO2 29 08/19/2014 1038   GLUCOSE 91 08/24/2023 1004   GLUCOSE 103 (H) 08/19/2014 1038   BUN 25 (H) 08/24/2023 1004   BUN 9 08/19/2014 1038   CREATININE 0.86 08/24/2023 1004   CREATININE 1.07 08/19/2014 1038   CALCIUM 9.2 08/24/2023 1004   CALCIUM 8.6 08/19/2014 1038   PROT 7.2 08/24/2023 1004   PROT 7.0 08/19/2014 1038   ALBUMIN 3.9 08/24/2023 1004   ALBUMIN 3.7 08/19/2014 1038   AST 74 (H) 08/24/2023 1004   ALT 77 (H) 08/24/2023 1004   ALT 36 08/19/2014 1038   ALKPHOS 49 08/24/2023 1004   ALKPHOS 81 08/19/2014 1038   BILITOT 0.7 08/24/2023 1004   GFRNONAA >60 08/24/2023 1004   GFRNONAA 59 (L) 08/19/2014 1038   GFRNONAA >60 03/16/2014 0911   GFRAA >60 08/06/2019 1413   GFRAA >60 08/19/2014 1038   GFRAA >60 03/16/2014 0911    No results found for: "SPEP", "UPEP"  Lab Results  Component Value Date   WBC 7.1 08/24/2023   NEUTROABS 5.0 08/24/2023   HGB 13.4 08/24/2023   HCT 39.7 08/24/2023   MCV 98.8 08/24/2023   PLT 210 08/24/2023      Chemistry      Component Value Date/Time   NA 138 08/24/2023 1004   NA 141 08/19/2014 1038   K 3.9 08/24/2023 1004   K 4.3 08/19/2014 1038   CL 102 08/24/2023 1004   CL 103 08/19/2014 1038   CO2 27 08/24/2023 1004   CO2 29 08/19/2014 1038   BUN 25 (H) 08/24/2023 1004   BUN 9 08/19/2014 1038   CREATININE 0.86 08/24/2023 1004   CREATININE 1.07 08/19/2014 1038      Component Value Date/Time   CALCIUM 9.2 08/24/2023 1004   CALCIUM 8.6 08/19/2014 1038   ALKPHOS 49 08/24/2023 1004   ALKPHOS 81  08/19/2014 1038   AST 74 (H) 08/24/2023 1004   ALT 77 (H) 08/24/2023 1004   ALT 36 08/19/2014 1038   BILITOT 0.7 08/24/2023 1004       On: 02/02/2017 11:56  RADIOGRAPHIC STUDIES: I have personally reviewed the radiological images  as listed and agreed with the findings in the report. No results found.   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 Stable.  However LDH slightly elevated-will repeat again in 3 months.  If continues to be elevated would recommend further workup.  # Vague symptoms - of "internal shaking, tightness in throat, pressure in head"-status post cardiology evaluation for palpitations.  Unclear etiology patient denies any anxiety or seizure-like activity.  Discussed regarding evaluation with psychiatry or neurology.  Patient declines.  Defer to PCP  # Abdominal discomfort-AST ALT elevated -likely fatty liver [once week on average- liquor]-status post EGD colonoscopy [Dr.vanga; 2023] I discussed discussed regarding weight loss/ no alcohol/eating more green leafy vegetables.  Currently working with trainer.  # HTN [recent diagnosis- dec 2021]- continue BP medications as prescribed-  stable.   # DISPOSITION: # /LDH in 3 months #  follow up in 12 months-MD/labs- cbc/cmp/LDH-Dr.B    Orders Placed This Encounter  Procedures   Lactate dehydrogenase    Standing Status:   Future    Expected Date:   08/23/2024    Expiration Date:   08/23/2024   CBC with Differential (Cancer Center Only)    Standing Status:   Future    Expected Date:   08/23/2024    Expiration Date:   08/23/2024   CMP (Cancer Center only)    Standing Status:   Future    Expected Date:   08/23/2024    Expiration Date:   08/23/2024   Lactate dehydrogenase    Standing Status:   Future    Expected Date:   11/21/2023    Expiration Date:   08/23/2024   All questions were answered. The patient knows to call the clinic with any problems, questions or concerns.      Earna Coder, MD 08/24/2023 11:52 AM

## 2023-11-23 ENCOUNTER — Inpatient Hospital Stay: Payer: 59 | Attending: Internal Medicine

## 2023-11-23 DIAGNOSIS — C8337 Diffuse large B-cell lymphoma, spleen: Secondary | ICD-10-CM

## 2023-11-23 DIAGNOSIS — Z8572 Personal history of non-Hodgkin lymphomas: Secondary | ICD-10-CM | POA: Insufficient documentation

## 2023-11-23 LAB — LACTATE DEHYDROGENASE: LDH: 164 U/L (ref 98–192)

## 2023-12-11 ENCOUNTER — Ambulatory Visit (INDEPENDENT_AMBULATORY_CARE_PROVIDER_SITE_OTHER): Payer: 59 | Admitting: Dermatology

## 2023-12-11 ENCOUNTER — Encounter: Payer: 59 | Admitting: Dermatology

## 2023-12-11 DIAGNOSIS — L814 Other melanin hyperpigmentation: Secondary | ICD-10-CM | POA: Diagnosis not present

## 2023-12-11 DIAGNOSIS — D1801 Hemangioma of skin and subcutaneous tissue: Secondary | ICD-10-CM

## 2023-12-11 DIAGNOSIS — B001 Herpesviral vesicular dermatitis: Secondary | ICD-10-CM

## 2023-12-11 DIAGNOSIS — W908XXA Exposure to other nonionizing radiation, initial encounter: Secondary | ICD-10-CM

## 2023-12-11 DIAGNOSIS — Z1283 Encounter for screening for malignant neoplasm of skin: Secondary | ICD-10-CM | POA: Diagnosis not present

## 2023-12-11 DIAGNOSIS — L738 Other specified follicular disorders: Secondary | ICD-10-CM

## 2023-12-11 DIAGNOSIS — L821 Other seborrheic keratosis: Secondary | ICD-10-CM

## 2023-12-11 DIAGNOSIS — L818 Other specified disorders of pigmentation: Secondary | ICD-10-CM

## 2023-12-11 DIAGNOSIS — L578 Other skin changes due to chronic exposure to nonionizing radiation: Secondary | ICD-10-CM

## 2023-12-11 DIAGNOSIS — B009 Herpesviral infection, unspecified: Secondary | ICD-10-CM

## 2023-12-11 DIAGNOSIS — D225 Melanocytic nevi of trunk: Secondary | ICD-10-CM

## 2023-12-11 DIAGNOSIS — L609 Nail disorder, unspecified: Secondary | ICD-10-CM

## 2023-12-11 DIAGNOSIS — D229 Melanocytic nevi, unspecified: Secondary | ICD-10-CM

## 2023-12-11 MED ORDER — VALACYCLOVIR HCL 1 G PO TABS: ORAL_TABLET | ORAL | 6 refills | Status: AC

## 2023-12-11 NOTE — Patient Instructions (Addendum)

## 2023-12-11 NOTE — Progress Notes (Signed)
 Follow-Up Visit   Subjective  Bethany Kelly is a 53 y.o. female who presents for the following: Skin Cancer Screening and Full Body Skin Exam. Patient with hx of non-hodgkin's lymphoma x11 years ago. Patient did have chemo and stem cell transplant, patient has been in remission.   The patient presents for Total-Body Skin Exam (TBSE) for skin cancer screening and mole check. The patient has spots, moles and lesions to be evaluated, some may be new or changing and the patient may have concern these could be cancer.   The following portions of the chart were reviewed this encounter and updated as appropriate: medications, allergies, medical history  Review of Systems:  No other skin or systemic complaints except as noted in HPI or Assessment and Plan.  Objective  Well appearing patient in no apparent distress; mood and affect are within normal limits.  A full examination was performed including scalp, head, eyes, ears, nose, lips, neck, chest, axillae, abdomen, back, buttocks, bilateral upper extremities, bilateral lower extremities, hands, feet, fingers, toes, fingernails, and toenails. All findings within normal limits unless otherwise noted below.   Relevant physical exam findings are noted in the Assessment and Plan.    Assessment & Plan   SKIN CANCER SCREENING PERFORMED TODAY.  ACTINIC DAMAGE - Chronic condition, secondary to cumulative UV/sun exposure - diffuse scaly erythematous macules with underlying dyspigmentation - Recommend daily broad spectrum sunscreen SPF 30+ to sun-exposed areas, reapply every 2 hours as needed.  - Staying in the shade or wearing long sleeves, sun glasses (UVA+UVB protection) and wide brim hats (4-inch brim around the entire circumference of the hat) are also recommended for sun protection.  - Call for new or changing lesions.  LENTIGINES, SEBORRHEIC KERATOSES, HEMANGIOMAS - Benign normal skin lesions - Benign-appearing - Call for any  changes   MELANOCYTIC NEVI - Tan-brown and/or pink-flesh-colored symmetric macules and papules - 3mm medium dark brown macule at L spinal mid back - Benign appearing on exam today - Observation - Call clinic for new or changing moles - Recommend daily use of broad spectrum spf 30+ sunscreen to sun-exposed areas.   Traumatic tattoo from metal Left forearm Exam: 4mm blue gray macule   Benign, observe.    Sebaceous Hyperplasia - Small yellow papules with a central dell - Benign-appearing - Observe. Call for changes.    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 Condition is bothersome/symptomatic for patient. Currently flared.   Herpes Simplex Virus = Cold Sores = Fever Blisters is a chronic recurring blistering; scabbing sore-producing viral infection that is recurrent usually in the same area triggered by stress, sun/UV exposure and trauma.  It is infectious and can be spread from person to person by direct contact.  It is not curable, but is treatable with topical and oral medication.  Treatment Plan:  Take Valacyclovir 2 grams every 12 hours for 2 doses with a glass of water at the first sign of symptoms. Take immediately when start to feel a little itch.     NAIL PROBLEM Exam: R great toenail with distal discoloration, thickening  Treatment Plan: Discussed molecular testing, oral terbinafine course for 3 months and can take a year for nail to grown back.   Patient defers treatment.     Return in about 1 year (around 12/10/2024) for w/ Dr. Annette Barters, TBSE.  I, Hector Littles, CMA, am acting as  scribe for Artemio Larry, MD .   Documentation: I have reviewed the above documentation for accuracy and completeness, and I agree with the above.  Artemio Larry, MD

## 2024-03-22 ENCOUNTER — Emergency Department

## 2024-03-22 ENCOUNTER — Other Ambulatory Visit: Payer: Self-pay

## 2024-03-22 ENCOUNTER — Emergency Department
Admission: EM | Admit: 2024-03-22 | Discharge: 2024-03-22 | Disposition: A | Discharge status: Home / Self Care | Attending: Emergency Medicine | Admitting: Emergency Medicine

## 2024-03-22 DIAGNOSIS — I10 Essential (primary) hypertension: Secondary | ICD-10-CM

## 2024-03-22 DIAGNOSIS — Z8572 Personal history of non-Hodgkin lymphomas: Secondary | ICD-10-CM | POA: Diagnosis not present

## 2024-03-22 DIAGNOSIS — I129 Hypertensive chronic kidney disease with stage 1 through stage 4 chronic kidney disease, or unspecified chronic kidney disease: Secondary | ICD-10-CM | POA: Insufficient documentation

## 2024-03-22 DIAGNOSIS — N189 Chronic kidney disease, unspecified: Secondary | ICD-10-CM | POA: Insufficient documentation

## 2024-03-22 DIAGNOSIS — R002 Palpitations: Secondary | ICD-10-CM | POA: Diagnosis present

## 2024-03-22 LAB — CBC
HCT: 43.5 % (ref 36.0–46.0)
Hemoglobin: 14.6 g/dL (ref 12.0–15.0)
MCH: 32.7 pg (ref 26.0–34.0)
MCHC: 33.6 g/dL (ref 30.0–36.0)
MCV: 97.5 fL (ref 80.0–100.0)
Platelets: 239 K/uL (ref 150–400)
RBC: 4.46 MIL/uL (ref 3.87–5.11)
RDW: 11.9 % (ref 11.5–15.5)
WBC: 9.9 K/uL (ref 4.0–10.5)
nRBC: 0 % (ref 0.0–0.2)

## 2024-03-22 LAB — BASIC METABOLIC PANEL WITH GFR
Anion gap: 10 (ref 5–15)
BUN: 14 mg/dL (ref 6–20)
CO2: 26 mmol/L (ref 22–32)
Calcium: 9.2 mg/dL (ref 8.9–10.3)
Chloride: 106 mmol/L (ref 98–111)
Creatinine, Ser: 0.82 mg/dL (ref 0.44–1.00)
GFR, Estimated: >60 mL/min (ref >60)
Glucose, Bld: 111 mg/dL — ABNORMAL HIGH (ref 70–99)
Potassium: 3.5 mmol/L (ref 3.5–5.1)
Sodium: 142 mmol/L (ref 135–145)

## 2024-03-22 LAB — T4, FREE: Free T4: 0.87 ng/dL (ref 0.61–1.12)

## 2024-03-22 LAB — TROPONIN I (HIGH SENSITIVITY)
Troponin I (High Sensitivity): 6 ng/L (ref <18)
Troponin I (High Sensitivity): 5 ng/L (ref <18)

## 2024-03-22 LAB — TSH: TSH: 10.742 u[IU]/mL — ABNORMAL HIGH (ref 0.350–4.500)

## 2024-03-22 LAB — MAGNESIUM: Magnesium: 1.6 mg/dL — ABNORMAL LOW (ref 1.7–2.4)

## 2024-03-22 MED ORDER — IBUPROFEN 100 MG/5ML PO SUSP
800.0000 mg | Freq: Once | ORAL | Status: DC
  Filled 2024-03-22: qty 40

## 2024-03-22 MED ORDER — IBUPROFEN 800 MG PO TABS: 800.0000 mg | ORAL_TABLET | Freq: Once | ORAL | Status: DC

## 2024-03-22 MED ORDER — MAGNESIUM SULFATE 2 GM/50ML IV SOLN
2.0000 g | Freq: Once | INTRAVENOUS | Status: AC
  Administered 2024-03-22: 2 g via INTRAVENOUS
  Filled 2024-03-22: qty 50

## 2024-03-22 MED ORDER — ACETAMINOPHEN 500 MG PO TABS
1000.0000 mg | ORAL_TABLET | Freq: Once | ORAL | Status: AC
  Administered 2024-03-22: 1000 mg via ORAL
  Filled 2024-03-22: qty 2

## 2024-03-22 NOTE — Discharge Instructions (Addendum)
 Your evaluation in the Emergency Department fortunately did not find any emergency conditions to account for your symptoms tonight.  Your magnesium  was ever so slightly low so you got a boost of magnesium  through the IV tonight.  Your blood pressure was high but came down without additional medications.  Take your blood pressure daily when calm and resting and keep a journal log of your blood pressures and talk to your doctor about adjustment of blood pressure medications as needed.  I made a referral to a cardiologist for further heart testing and palpitation testing as needed.  Thank you for choosing us  for your health care today!  Please see your primary doctor this week for a follow up appointment.   If you have any new, worsening, or unexpected symptoms call your doctor right away or come back to the emergency department for reevaluation.  It was my pleasure to care for you today.   Ginnie EDISON Cyrena, MD

## 2024-03-22 NOTE — ED Notes (Signed)
 Blue top sent down.

## 2024-03-22 NOTE — ED Triage Notes (Signed)
 Pt to ed from home via POV for palpitations and HTN since 1030pm. Pt has HX of same. Pt is caox4, in no acute distress and ambulatory in triage. Pt takes BP meds but no AFIB meds.

## 2024-03-22 NOTE — ED Provider Notes (Signed)
 [facility] Provider Note    Event Date/Time   First MD Initiated Contact with Patient 03/22/24 [phone]     (approximate)   History   Hypertension and Palpitations   HPI  Bethany Kelly is a 53 y.o. female   Past medical history of diffuse large B-cell lymphoma in remission no active treatment, palpitations, CKD, hypertension, here with palpitations tonight more intense in severity but they are there all the time with no associated chest pain or shortness of breath.  Took her blood pressure which was elevated after the intense palpitations.  Now subsided.  No other acute medical complaints, no dietary changes, changes in medications, drinks occasional alcohol but no significant changes.     Independent Historian contributed to assessment above: Daughter corroborates information above  External Medical Documents Reviewed: Cardiology note from 2022 evaluation for palpitations with unremarkable Holter monitoring and echocardiogram at that time.      Physical Exam   Triage Vital Signs: ED Triage Vitals  Encounter Vitals Group     BP 03/22/24 0145 (!) 175/128     Girls Systolic BP Percentile --      Girls Diastolic BP Percentile --      Boys Systolic BP Percentile --      Boys Diastolic BP Percentile --      Pulse Rate 03/22/24 0145 100     Resp 03/22/24 0145 16     Temp 03/22/24 0145 98 F (36.7 C)     Temp Source 03/22/24 0145 Oral     SpO2 03/22/24 0145 95 %     Weight --      Height 03/22/24 0146 5' 8 (1.727 m)     Head Circumference --      Peak Flow --      Pain Score 03/22/24 0146 0     Pain Loc --      Pain Education --      Exclude from Growth Chart --     Most recent vital signs: Vitals:   03/22/24 0145 03/22/24 0315  BP: (!) 175/128 (!) 150/90  Pulse: 100 78  Resp: 16 20  Temp: 98 F (36.7 C)   SpO2: 95% 100%    General: Awake, no distress.  CV:  Good peripheral perfusion.  Resp:  Normal effort.  Abd:  No distention.   Other:  Hypertensive otherwise vital signs within normal limits, regular rate and rhythm without murmurs on auscultation of heart, clear lungs, skin appears warm well-perfused.  No tremors or tongue fasciculation.  Pleasant woman in no acute distress.   ED Results / Procedures / Treatments   Labs (all labs ordered are listed, but only abnormal results are displayed) Labs Reviewed  BASIC METABOLIC PANEL WITH GFR - Abnormal; Notable for the following components:      Result Value   Glucose, Bld 111 (*)    All other components within normal limits  MAGNESIUM  - Abnormal; Notable for the following components:   Magnesium  1.6 (*)    All other components within normal limits  TSH - Abnormal; Notable for the following components:   TSH 10.742 (*)    All other components within normal limits  CBC  T4, FREE  TROPONIN I (HIGH SENSITIVITY)  TROPONIN I (HIGH SENSITIVITY)     I ordered and reviewed the above labs they are notable for cell counts electrolytes unremarkable except for mildly low magnesium .  Troponin initial normal.  EKG  ED ECG REPORT I, Ginnie Shams,  the attending physician, personally viewed and interpreted this ECG.   Date: 03/22/2024  EKG Time: 0151  Rate: 91  Rhythm: sinus  Axis: nl  Intervals:nl  ST&T Change: no stemi    RADIOLOGY I independently reviewed and interpreted chest x-ray I see no obvious focality pneumothorax I also reviewed radiologist's formal read.   PROCEDURES:  Critical Care performed: No  Procedures   MEDICATIONS ORDERED IN ED: Medications  acetaminophen  (TYLENOL ) tablet 1,000 mg (has no administration in time range)  magnesium  sulfate IVPB 2 g 50 mL (has no administration in time range)  ibuprofen  (ADVIL ) 100 MG/5ML suspension 800 mg (has no administration in time range)      IMPRESSION / MDM / ASSESSMENT AND PLAN / ED COURSE  I reviewed the triage vital signs and the nursing notes.                                Patient's  presentation is most consistent with acute presentation with potential threat to life or bodily function.  Differential diagnosis includes, but is not limited to, dysrhythmia, electrolyte disturbance, thyroid  abnormalities, --ACS PE dissection withdrawal considered but less likely   The patient is on the cardiac monitor to evaluate for evidence of arrhythmia and/or significant heart rate changes.  MDM:    Here with worsening palpitations to a longstanding history of the same with unremarkable workup by cardiology a few years ago.  No associated chest pain or shortness of breath and normal-appearing EKG nonischemic with normal troponin, doubt ACS or other cardiopulmonary emergencies like PE or dissection.  Check electrolytes and thyroid  which were largely unremarkable mildly low magnesium  was repleted IV.  Kept on cardiac monitoring without any malignant dysrhythmias noted.  She is concerned about her blood pressure which without intervention have gotten much better in the emergency department on recheck.  No evidence of endorgan damage fortunately.  Mild headache now no thunderclap headache no associated neurologic complaints so I doubt intracranial neurologic emergency at this time.  Will give Tylenol  and Motrin .  Given unremarkable workup, stability in the emergency department plan will be for discharge and close PMD follow-up.  I will make a referral to cardiology for repeat Holter monitoring or further heart testing as needed.  Regarding blood pressure, I advised her to take blood pressure while calm and resting at home, continue taking prescribed antihypertensives, and keep a journal log and adjust medications with her PMD as needed.       FINAL CLINICAL IMPRESSION(S) / ED DIAGNOSES   Final diagnoses:  Heart palpitations  Hypomagnesemia  Uncontrolled hypertension     Rx / DC Orders   ED Discharge Orders          Ordered    Ambulatory referral to Cardiology        03/22/24  0400             Note:  This document was prepared using Dragon voice recognition software and may include unintentional dictation errors.    Cyrena Mylar, MD 03/22/24 0400

## 2024-03-26 NOTE — Progress Notes (Unsigned)
  Cardiology Office Note   Date:  03/28/2024  ID:  Bethany Kelly, DOB 1971/01/08, MRN [id number] PCP: Buren Rock HERO, MD  [facility] Providers Cardiologist:  Caron Poser, MD     History of Present Illness Bethany Kelly is a 53 y.o. female PMH HTN, DLBCL in remission, who presents for further evaluation and management of palpitations.  Pt seen in ED on 03/22/24 for palpitations. Workup largely reassuring with normal electrolytes, blood counts, etc. TSH elevated but free T4 normal. ECG sinus with PACs.  Patient reports that palpitations have been occurring for a very long period of time.  Denies any syncope.  Episodes are extremely frequent without clear patterns.  She notes that is sometimes difficult to sleep given the frequency.  She denies any syncope.  She does report frequent alcohol use about 5 times per week, 1 to 2 glasses of wine in a time.  She denies excessive caffeine use.  Denies any other recreational substance use.  Relevant CVD History -Normal biventricular function, mild LVH, mild TR TTE 04/2021 ([facility])   ROS: Pt denies any chest discomfort, jaw pain, arm pain, syncope, presyncope, orthopnea, PND, or LE edema.  Studies Reviewed I have independently reviewed the patient's ECG, recent blood work, and medical records from recent ED visit.  Physical Exam VS:  BP 118/74 (BP Location: Right Arm, Patient Position: Sitting, Cuff Size: Normal)   Pulse 91   Ht 5' 8 (1.727 m)   Wt 210 lb (95.3 kg)   SpO2 98%   BMI 31.93 kg/m        Wt Readings from Last 3 Encounters:  03/28/24 210 lb (95.3 kg)  08/24/23 206 lb 3.2 oz (93.5 kg)  08/11/23 202 lb (91.6 kg)    GEN: No acute distress. NECK: No JVD; No carotid bruits. CARDIAC: RRR, premature beats present, no murmurs, rubs, gallops. RESPIRATORY:  Clear to auscultation. EXTREMITIES:  Warm and well-perfused. No edema.  ASSESSMENT AND PLAN Palpitations PACs Perimenopause Patient presents with extremely  frequent palpitations without syncope.  ECG in the ED showed PACs, and premature beats were present on auscultation today.  She reports that she is going through menopause.  Frequent alcohol use is a possible trigger.  Recent labs were unremarkable.  Symptoms are quite bothersome.  Plan: - Echocardiogram to rule out structural causes - Zio monitor to evaluate for sustained arrhythmias - Start metoprolol  XL 25 mg daily; uptitrate to symptoms  Alcohol use Counseled on excessive alcohol use and possible contribution to her symptoms.  Recommend that she reduce intake.        Dispo: RTC 3 months or sooner as needed  Signed, Caron Poser, MD

## 2024-03-27 ENCOUNTER — Encounter: Payer: Self-pay | Admitting: *Deleted

## 2024-03-28 ENCOUNTER — Ambulatory Visit

## 2024-03-28 VITALS — BP 118/74 | HR 91 | Ht 68.0 in | Wt 210.0 lb

## 2024-03-28 DIAGNOSIS — Z780 Asymptomatic menopausal state: Secondary | ICD-10-CM | POA: Diagnosis not present

## 2024-03-28 DIAGNOSIS — F1090 Alcohol use, unspecified, uncomplicated: Secondary | ICD-10-CM

## 2024-03-28 DIAGNOSIS — I491 Atrial premature depolarization: Secondary | ICD-10-CM

## 2024-03-28 DIAGNOSIS — R002 Palpitations: Secondary | ICD-10-CM | POA: Diagnosis not present

## 2024-03-28 MED ORDER — METOPROLOL SUCCINATE ER 25 MG PO TB24
25.0000 mg | ORAL_TABLET | Freq: Every day | ORAL | 3 refills | Status: AC
Start: 2024-03-28 — End: ?

## 2024-03-28 NOTE — Patient Instructions (Signed)
 Medication Instructions:  Your physician recommends the following medication changes.  START TAKING: Metoprolol  (Toprol  XL) 25 mg by mouth once daily  *If you need a refill on your cardiac medications before your next appointment, please call your pharmacy*  Lab Work: No labs ordered today  If you have labs (blood work) drawn today and your tests are completely normal, you will receive your results only by: MyChart Message (if you have MyChart) OR A paper copy in the mail If you have any lab test that is abnormal or we need to change your treatment, we will call you to review the results.  Testing/Procedures: Your physician has requested that you have an echocardiogram. Echocardiography is a painless test that uses sound waves to create images of your heart. It provides your doctor with information about the size and shape of your heart and how well your heart's chambers and valves are working.   You may receive an ultrasound enhancing agent through an IV if needed to better visualize your heart during the echo. This procedure takes approximately one hour.  There are no restrictions for this procedure.  This will take place at [address] [facility] [address], [place] [address]  Please note: We ask at that you not bring children with you during ultrasound (echo/ vascular) testing. Due to room size and safety concerns, children are not allowed in the ultrasound rooms during exams. Our front office staff cannot provide observation of children in our lobby area while testing is being conducted. An adult accompanying a patient to their appointment will only be allowed in the ultrasound room at the discretion of the ultrasound technician under special circumstances. We apologize for any inconvenience.   Follow-Up: At [facility], you and your health needs are our priority.  As part of our continuing mission to provide you with exceptional heart care, our providers are  all part of one team.  This team includes your primary Cardiologist (physician) and Advanced Practice Providers or APPs (Physician Assistants and Nurse Practitioners) who all work together to provide you with the care you need, when you need it.  Your next appointment:   3 month(s)  Provider:  Caron Poser, MD

## 2024-05-14 ENCOUNTER — Ambulatory Visit

## 2024-05-14 DIAGNOSIS — I491 Atrial premature depolarization: Secondary | ICD-10-CM

## 2024-05-14 DIAGNOSIS — R002 Palpitations: Secondary | ICD-10-CM

## 2024-05-14 LAB — ECHOCARDIOGRAM COMPLETE
Area-P 1/2: 3.27 cm2
S' Lateral: 2.7 cm

## 2024-05-15 ENCOUNTER — Ambulatory Visit: Payer: Self-pay

## 2024-05-15 ENCOUNTER — Ambulatory Visit

## 2024-05-15 DIAGNOSIS — I491 Atrial premature depolarization: Secondary | ICD-10-CM

## 2024-05-15 DIAGNOSIS — R002 Palpitations: Secondary | ICD-10-CM

## 2024-06-27 ENCOUNTER — Encounter: Payer: Self-pay | Admitting: Cardiology

## 2024-06-27 ENCOUNTER — Ambulatory Visit: Attending: Cardiology | Admitting: Cardiology

## 2024-06-27 VITALS — BP 121/83 | HR 79 | Ht 68.0 in | Wt 223.2 lb

## 2024-06-27 DIAGNOSIS — I491 Atrial premature depolarization: Secondary | ICD-10-CM | POA: Diagnosis not present

## 2024-06-27 DIAGNOSIS — I10 Essential (primary) hypertension: Secondary | ICD-10-CM | POA: Diagnosis not present

## 2024-06-27 DIAGNOSIS — F1090 Alcohol use, unspecified, uncomplicated: Secondary | ICD-10-CM | POA: Diagnosis not present

## 2024-06-27 DIAGNOSIS — R002 Palpitations: Secondary | ICD-10-CM

## 2024-06-27 NOTE — Progress Notes (Signed)
 Cardiology Office Note   Date:  06/27/2024  ID:  Bethany Kelly, DOB Aug 23, 1970, MRN [id number] PCP: Buren Rock HERO, MD  [facility] Providers Cardiologist:  Caron Poser, MD Cardiology APP:  Gerard Frederick, NP     History of Present Illness Bethany Kelly is a 53 y.o. female with a past medical history of hypertension, DLBCL in remission, palpitations, who is here today for follow-up.  She previously had an echocardiogram completed at [facility] in 04/2021 that showed normal biventricular function, mild LVH, mild TR.    She was evaluated in the emergency department 03/22/2024 for palpitations.  Workup was largely reassuring with normal labs and EKG revealing PACs.  She was referred to cardiology.  She was last seen in clinic 03/28/2024 by Dr. Poser.  She had reported palpitations that been occurring for very long period of time but denied any syncope.  She does have episodes of extremely frequent without clear patterns.  She did report frequent alcohol use about 5 times per week with 1 to 2 glasses of wine at a time.  She denies excessive caffeine use.  Denies any other recreational drug use.  She was scheduled for an echocardiogram to rule out any structural abnormalities, placed on a ZIO XT monitor for sustained arrhythmias, started on Toprol -XL 25 mg daily.  She returns to clinic today   ROS: 10 point review of systems has been reviewed and considered negative the exception was been listed in the HPI  Studies Reviewed     2d echo 05/14/2024 1. Left ventricular ejection fraction, by estimation, is 55 to 60%. Left  ventricular ejection fraction by PLAX is 59 %. The left ventricle has  normal function. The left ventricle has no regional wall motion  abnormalities. Left ventricular diastolic  parameters were normal.   2. Right ventricular systolic function is normal. The right ventricular  size is normal. There is normal pulmonary artery systolic pressure. The  estimated right  ventricular systolic pressure is 28.0 mmHg.   3. The mitral valve is normal in structure. No evidence of mitral valve  regurgitation. No evidence of mitral stenosis.   4. The aortic valve is tricuspid. Aortic valve regurgitation is not  visualized. No aortic stenosis is present.   5. The inferior vena cava is normal in size with greater than 50%  respiratory variability, suggesting right atrial pressure of 3 mmHg.   Risk Assessment/Calculations           Physical Exam VS:  BP 121/83   Pulse 79   Ht 5' 8 (1.727 m)   Wt 223 lb 3.2 oz (101.2 kg)   SpO2 94%   BMI 33.94 kg/m        Wt Readings from Last 3 Encounters:  06/27/24 223 lb 3.2 oz (101.2 kg)  03/28/24 210 lb (95.3 kg)  08/24/23 206 lb 3.2 oz (93.5 kg)    GEN: Well nourished, well developed in no acute distress NECK: No JVD; No carotid bruits CARDIAC: RRR, no murmurs, rubs, gallops RESPIRATORY:  Clear to auscultation without rales, wheezing or rhonchi  ABDOMEN: Soft, non-tender, non-distended EXTREMITIES:  No edema; No deformity   ASSESSMENT AND PLAN Palpitations/PACs where she was previously ordered a ZIO XT monitor and started on Toprol -XL 25 mg daily.  Since starting the medication she states her palpitations have improved.  Unfortunately she did not wear a monitor as the directions were confusing.  The monitor is being placed today while she is in clinic.  She also  underwent echocardiogram to rule out any structural abnormalities.  Echo revealed an LVEF of 55-60%, no RWMA, right ventricular systolic size and function were normal and there were no valvular abnormalities noted.  She has been continued on Toprol -XL 25 mg daily.  Hypertension with a blood pressure today 121/83.  Blood pressure has been well-controlled on current lisinopril 10 mg daily, HCTZ 12.5 mg daily, and her Toprol -XL 25 mg daily.  Monitoring pressures 1 to 2 hours postmedication administration at home as well.  Alcohol use which is likely  contributing factor to her symptoms.  Recommendation continues for reduced intake.       Dispo: Patient to return to clinic see MD/APP in 3 months or sooner if needed for further recommendations after ZIO monitoring has been completed  Signed, Purl Claytor, NP

## 2024-06-27 NOTE — Patient Instructions (Signed)
 Medication Instructions:  Your physician recommends that you continue on your current medications as directed. Please refer to the Current Medication list given to you today.   *If you need a refill on your cardiac medications before your next appointment, please call your pharmacy*  Lab Work: No labs ordered today  If you have labs (blood work) drawn today and your tests are completely normal, you will receive your results only by: MyChart Message (if you have MyChart) OR A paper copy in the mail If you have any lab test that is abnormal or we need to change your treatment, we will call you to review the results.  Testing/Procedures: No test ordered today   Follow-Up: At [facility], you and your health needs are our priority.  As part of our continuing mission to provide you with exceptional heart care, our providers are all part of one team.  This team includes your primary Cardiologist (physician) and Advanced Practice Providers or APPs (Physician Assistants and Nurse Practitioners) who all work together to provide you with the care you need, when you need it.  Your next appointment:   3 month(s)  Provider:   You may see Caron Poser, MD or one of the following Advanced Practice Providers on your designated Care Team:   Tylene Lunch, NP

## 2024-08-22 ENCOUNTER — Inpatient Hospital Stay: Payer: 59

## 2024-08-22 ENCOUNTER — Inpatient Hospital Stay: Payer: 59 | Admitting: Internal Medicine

## 2024-08-22 ENCOUNTER — Telehealth: Payer: Self-pay | Admitting: Internal Medicine

## 2024-08-22 NOTE — Telephone Encounter (Signed)
 Pt was scheduled for lab/md today 1/30 and is sick, needs to r/s. Appts have been r/s and new date/time confirmed with pt

## 2024-08-29 ENCOUNTER — Other Ambulatory Visit: Payer: Self-pay | Admitting: *Deleted

## 2024-08-29 DIAGNOSIS — C8337 Diffuse large B-cell lymphoma, spleen: Secondary | ICD-10-CM

## 2024-09-02 ENCOUNTER — Inpatient Hospital Stay: Admitting: Internal Medicine

## 2024-09-02 ENCOUNTER — Inpatient Hospital Stay

## 2024-09-25 ENCOUNTER — Ambulatory Visit

## 2024-12-30 ENCOUNTER — Encounter: Admitting: Dermatology
